# Patient Record
Sex: Female | Born: 1945
Health system: Southern US, Community
[De-identification: ages and names within clinical notes are randomized; demographics above are authoritative.]

## PROBLEM LIST (undated history)

## (undated) DIAGNOSIS — N390 Urinary tract infection, site not specified: Secondary | ICD-10-CM

## (undated) DIAGNOSIS — K429 Umbilical hernia without obstruction or gangrene: Secondary | ICD-10-CM

## (undated) DIAGNOSIS — T4145XA Adverse effect of unspecified anesthetic, initial encounter: Secondary | ICD-10-CM

## (undated) DIAGNOSIS — T8859XA Other complications of anesthesia, initial encounter: Secondary | ICD-10-CM

## (undated) DIAGNOSIS — N3941 Urge incontinence: Secondary | ICD-10-CM

## (undated) DIAGNOSIS — R32 Unspecified urinary incontinence: Secondary | ICD-10-CM

## (undated) DIAGNOSIS — E039 Hypothyroidism, unspecified: Secondary | ICD-10-CM

## (undated) DIAGNOSIS — Z5181 Encounter for therapeutic drug level monitoring: Secondary | ICD-10-CM

## (undated) DIAGNOSIS — E7800 Pure hypercholesterolemia, unspecified: Secondary | ICD-10-CM

## (undated) DIAGNOSIS — N3281 Overactive bladder: Secondary | ICD-10-CM

## (undated) DIAGNOSIS — Z79890 Hormone replacement therapy (postmenopausal): Secondary | ICD-10-CM

## (undated) HISTORY — DX: Hormone replacement therapy: Z79.890

## (undated) HISTORY — DX: Unspecified urinary incontinence: R32

## (undated) HISTORY — PX: TOE SURGERY: SHX1073

## (undated) HISTORY — DX: Encounter for therapeutic drug level monitoring: Z51.81

## (undated) HISTORY — PX: KNEE SURGERY: SHX244

## (undated) HISTORY — DX: Urinary tract infection, site not specified: N39.0

## (undated) HISTORY — PX: ROTATOR CUFF REPAIR: SHX139

## (undated) HISTORY — PX: CYSTOCELE REPAIR: SHX163

## (undated) HISTORY — PX: ABDOMINAL HYSTERECTOMY: SHX81

## (undated) HISTORY — PX: REDUCTION MAMMAPLASTY: SUR839

## (undated) HISTORY — PX: TONSILLECTOMY: SUR1361

## (undated) HISTORY — PX: NASAL SINUS SURGERY: SHX719

## (undated) HISTORY — DX: Urge incontinence: N39.41

## (undated) HISTORY — DX: Overactive bladder: N32.81

## (undated) HISTORY — DX: Umbilical hernia without obstruction or gangrene: K42.9

## (undated) HISTORY — PX: OTHER SURGICAL HISTORY: SHX169

## (undated) HISTORY — PX: BREAST REDUCTION SURGERY: SHX8

## (undated) HISTORY — PX: BREAST SURGERY: SHX581

---

## 2000-08-16 ENCOUNTER — Encounter: Payer: Self-pay | Admitting: Internal Medicine

## 2000-08-16 ENCOUNTER — Ambulatory Visit (HOSPITAL_COMMUNITY): Admission: RE | Admit: 2000-08-16 | Discharge: 2000-08-16 | Payer: Self-pay | Admitting: Internal Medicine

## 2000-12-04 ENCOUNTER — Encounter: Payer: Self-pay | Admitting: Obstetrics and Gynecology

## 2000-12-04 ENCOUNTER — Ambulatory Visit (HOSPITAL_COMMUNITY): Admission: RE | Admit: 2000-12-04 | Discharge: 2000-12-04 | Payer: Self-pay | Admitting: Obstetrics and Gynecology

## 2001-04-05 ENCOUNTER — Encounter: Payer: Self-pay | Admitting: Obstetrics and Gynecology

## 2001-04-05 ENCOUNTER — Ambulatory Visit (HOSPITAL_COMMUNITY): Admission: RE | Admit: 2001-04-05 | Discharge: 2001-04-05 | Payer: Self-pay | Admitting: Obstetrics and Gynecology

## 2001-10-21 ENCOUNTER — Other Ambulatory Visit: Admission: RE | Admit: 2001-10-21 | Discharge: 2001-10-21 | Payer: Self-pay | Admitting: Dermatology

## 2001-11-21 ENCOUNTER — Encounter: Payer: Self-pay | Admitting: Plastic Surgery

## 2001-11-21 ENCOUNTER — Ambulatory Visit (HOSPITAL_COMMUNITY): Admission: RE | Admit: 2001-11-21 | Discharge: 2001-11-21 | Payer: Self-pay | Admitting: Plastic Surgery

## 2001-11-22 ENCOUNTER — Encounter (INDEPENDENT_AMBULATORY_CARE_PROVIDER_SITE_OTHER): Payer: Self-pay | Admitting: *Deleted

## 2001-11-22 ENCOUNTER — Ambulatory Visit (HOSPITAL_BASED_OUTPATIENT_CLINIC_OR_DEPARTMENT_OTHER): Admission: RE | Admit: 2001-11-22 | Discharge: 2001-11-23 | Payer: Self-pay | Admitting: Plastic Surgery

## 2002-04-10 ENCOUNTER — Other Ambulatory Visit: Admission: RE | Admit: 2002-04-10 | Discharge: 2002-04-10 | Payer: Self-pay | Admitting: Unknown Physician Specialty

## 2002-05-27 ENCOUNTER — Inpatient Hospital Stay (HOSPITAL_COMMUNITY): Admission: RE | Admit: 2002-05-27 | Discharge: 2002-05-29 | Payer: Self-pay | Admitting: Obstetrics & Gynecology

## 2002-10-02 ENCOUNTER — Ambulatory Visit (HOSPITAL_COMMUNITY): Admission: RE | Admit: 2002-10-02 | Discharge: 2002-10-02 | Payer: Self-pay | Admitting: Family Medicine

## 2002-10-02 ENCOUNTER — Encounter: Payer: Self-pay | Admitting: Family Medicine

## 2002-11-27 ENCOUNTER — Encounter: Payer: Self-pay | Admitting: Obstetrics & Gynecology

## 2002-11-27 ENCOUNTER — Ambulatory Visit (HOSPITAL_COMMUNITY): Admission: RE | Admit: 2002-11-27 | Discharge: 2002-11-27 | Payer: Self-pay | Admitting: Obstetrics & Gynecology

## 2002-12-09 ENCOUNTER — Ambulatory Visit (HOSPITAL_COMMUNITY): Admission: RE | Admit: 2002-12-09 | Discharge: 2002-12-09 | Payer: Self-pay | Admitting: Obstetrics & Gynecology

## 2002-12-09 ENCOUNTER — Encounter: Payer: Self-pay | Admitting: Obstetrics & Gynecology

## 2002-12-17 ENCOUNTER — Ambulatory Visit (HOSPITAL_COMMUNITY): Admission: RE | Admit: 2002-12-17 | Discharge: 2002-12-17 | Payer: Self-pay | Admitting: Obstetrics & Gynecology

## 2002-12-17 ENCOUNTER — Other Ambulatory Visit: Admission: RE | Admit: 2002-12-17 | Discharge: 2002-12-17 | Payer: Self-pay | Admitting: Radiology

## 2002-12-17 ENCOUNTER — Encounter: Payer: Self-pay | Admitting: Obstetrics & Gynecology

## 2003-01-21 ENCOUNTER — Ambulatory Visit (HOSPITAL_COMMUNITY): Admission: RE | Admit: 2003-01-21 | Discharge: 2003-01-21 | Payer: Self-pay | Admitting: Family Medicine

## 2003-01-21 ENCOUNTER — Emergency Department (HOSPITAL_COMMUNITY): Admission: EM | Admit: 2003-01-21 | Discharge: 2003-01-21 | Payer: Self-pay | Admitting: Emergency Medicine

## 2003-12-10 ENCOUNTER — Ambulatory Visit (HOSPITAL_COMMUNITY): Admission: RE | Admit: 2003-12-10 | Discharge: 2003-12-10 | Payer: Self-pay | Admitting: Obstetrics & Gynecology

## 2004-08-15 ENCOUNTER — Ambulatory Visit: Payer: Self-pay | Admitting: Orthopedic Surgery

## 2004-09-26 ENCOUNTER — Ambulatory Visit: Payer: Self-pay | Admitting: Orthopedic Surgery

## 2004-12-13 ENCOUNTER — Ambulatory Visit (HOSPITAL_COMMUNITY): Admission: RE | Admit: 2004-12-13 | Discharge: 2004-12-13 | Payer: Self-pay | Admitting: Obstetrics & Gynecology

## 2005-04-11 IMAGING — CT CT PELVIS W/ CM
1 of 2 series · 14 of 32 positions shown, 19 images · IV contrast (CONTRAST)
Comparison: none

CLINICAL DATA: Right lower quadrant and mid abdominal pain.
 CT ABDOMEN WITH CONTRAST
 Spiral scanning was performed after oral administration of dilute contrast and during intravenous administration of 100 cc Omnipaque 300.  
 Lung bases are clear.  The liver has a normal appearance without focal lesions or biliary ductal dilatation.  Spleen, pancreas, adrenal glands and kidneys are normal.  No bowel pathology seen in the abdominal portion of the scan.  No free fluid or air.  No retroperitoneal pathology.  
 IMPRESSION
 Normal CT scan of the abdomen.
 CT PELVIS WITH CONTRAST
 Spiral scanning is performed after oral and intravenous contrast administration.
 The patient has inflammatory change surrounding the sigmoid colon which is present towards the right side of the lower abdomen and pelvis.  There is no evidence of actual abscess or free air.  The findings are likely to be due to diverticulitis but other forms of colitis are not excluded.  No sign of appendicitis.  No free fluid.  No sign of adenopathy.
 1.  Inflammatory changes of the sigmoid colon, most likely to represent diverticulitis.  No sign of abscess or free air.  See above.

[Series 8741: — · axial · 0.71mm/px · z∈[+1312,+1742]mm · 14 of 96 slices shown, 19 images]
[im 5/96  soft-tissue]
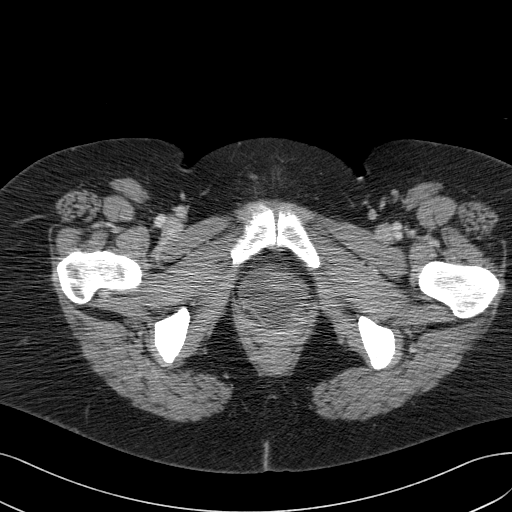
[im 5/96  bone]
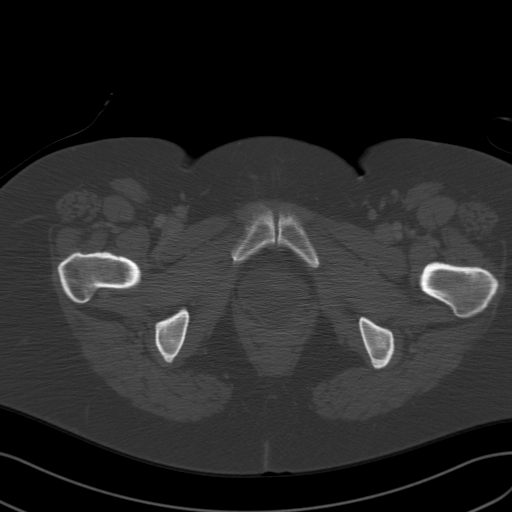
[im 14/96  soft-tissue]
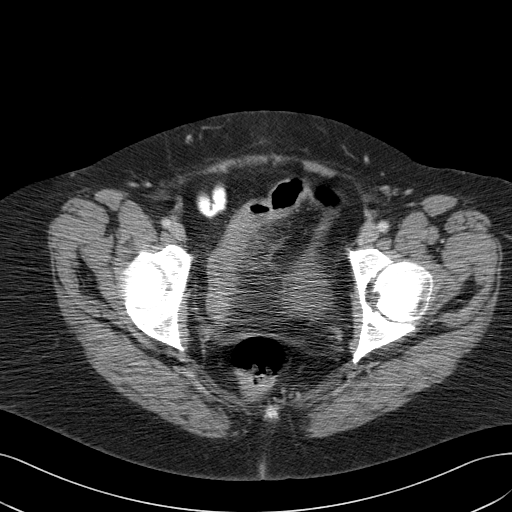
[im 19/96  soft-tissue]
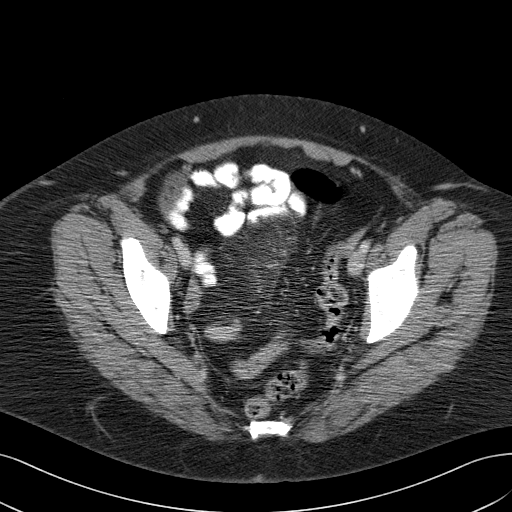
[im 28/96  soft-tissue]
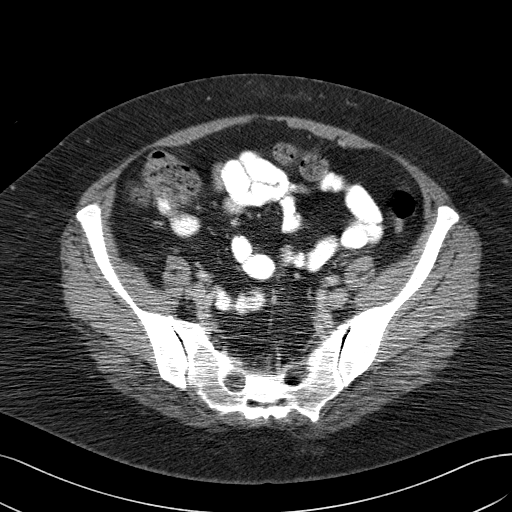
[im 32/96  soft-tissue]
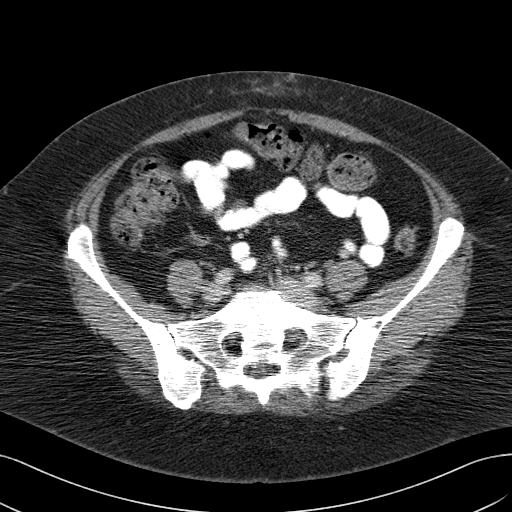
[im 41/96  soft-tissue]
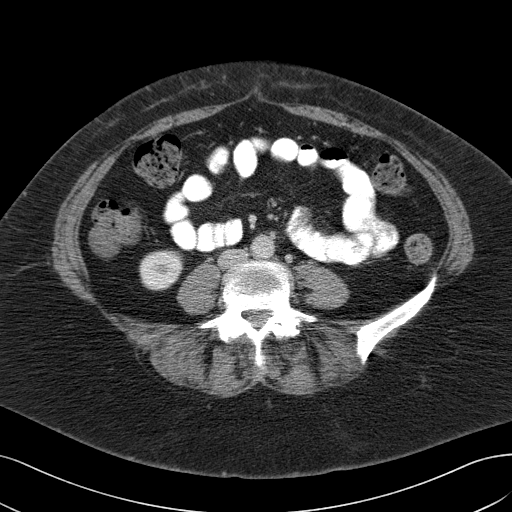
[im 50/96  soft-tissue]
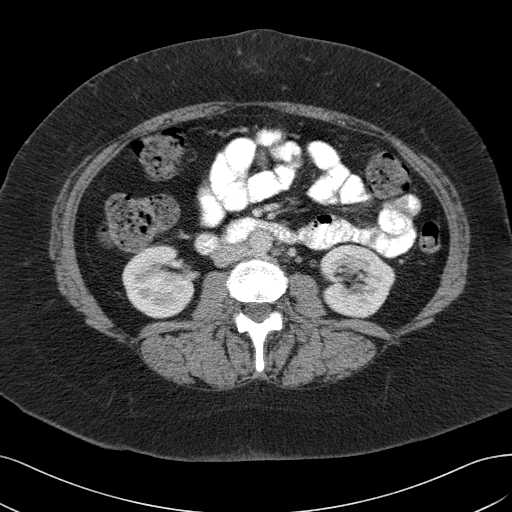
[im 55/96  soft-tissue]
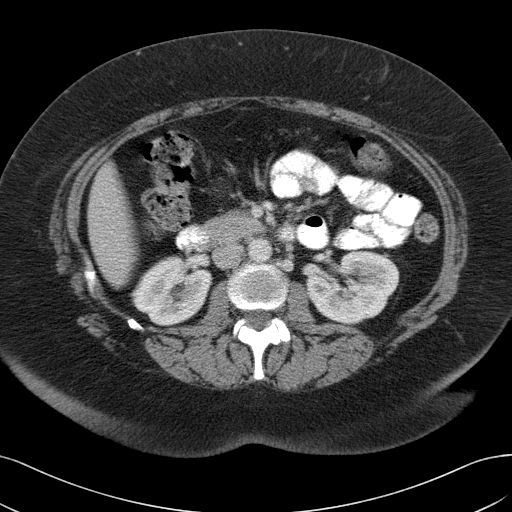
[im 64/96  soft-tissue]
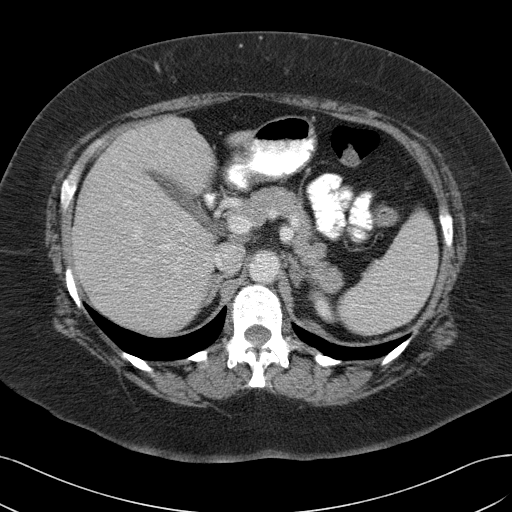
[im 64/96  bone]
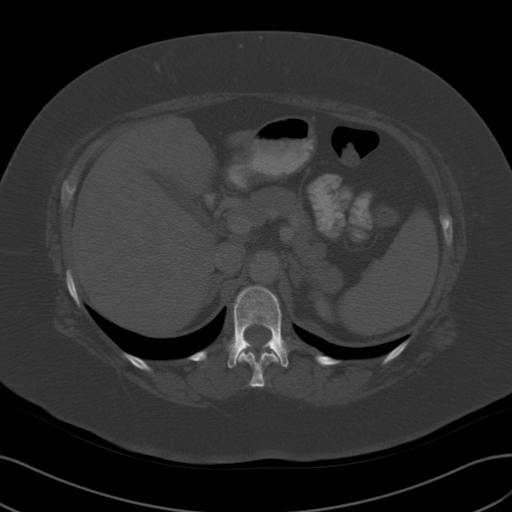
[im 68/96  soft-tissue]
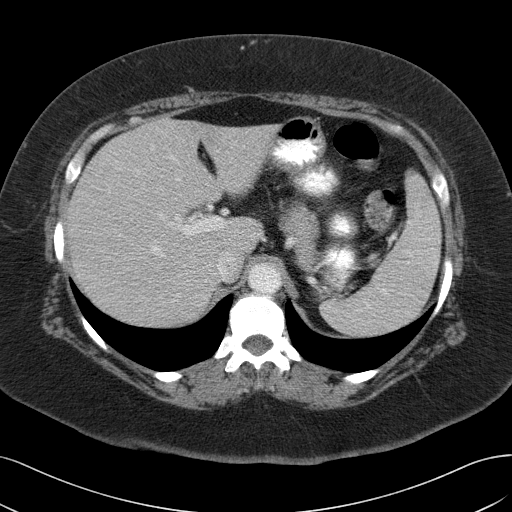
[im 77/96  soft-tissue]
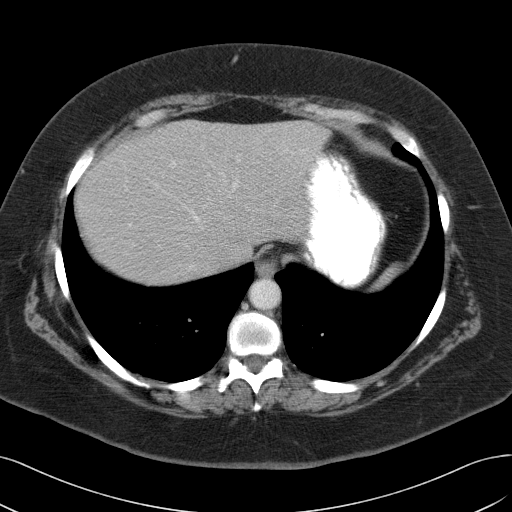
[im 77/96  lung]
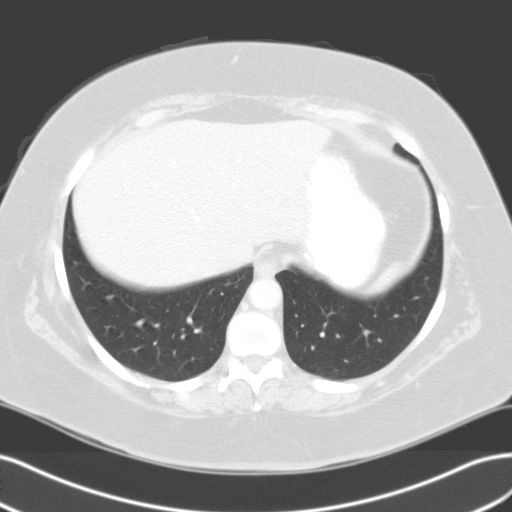
[im 82/96  soft-tissue]
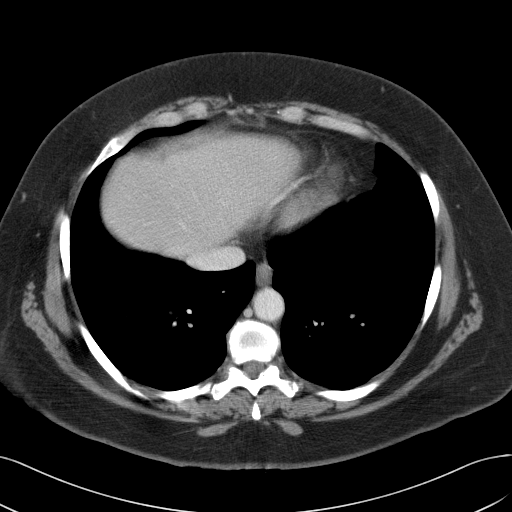
[im 82/96  lung]
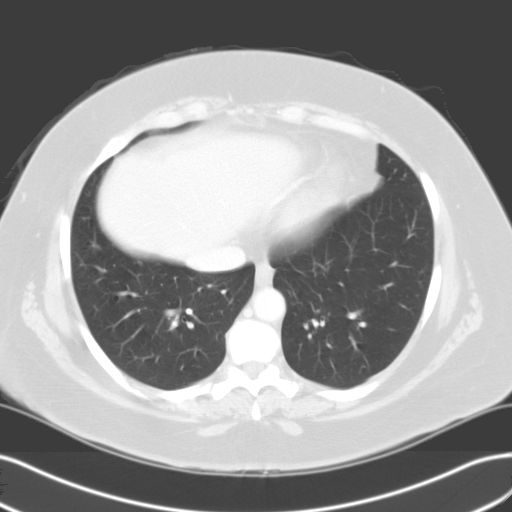
[im 86/96  lung]
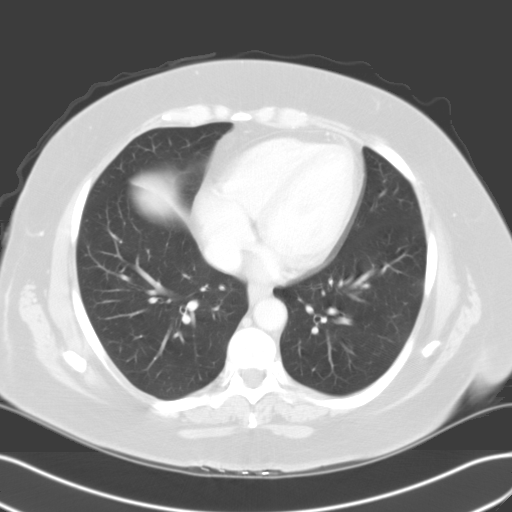
[im 91/96  soft-tissue]
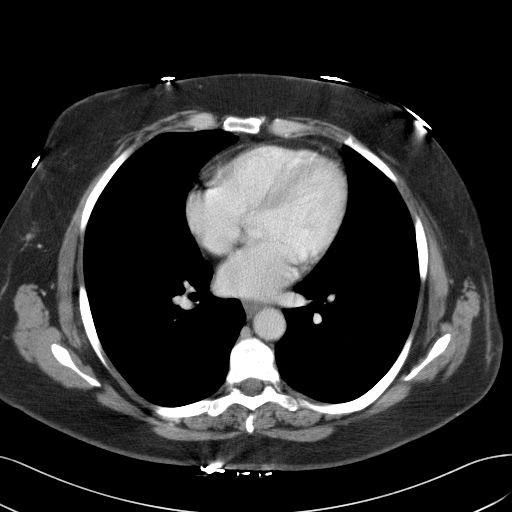
[im 91/96  lung]
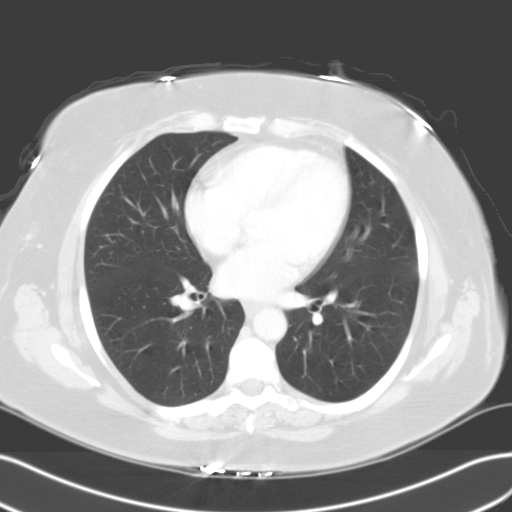

[14 of 32 positions shown; findings below may reference images not displayed]

## 2005-10-07 ENCOUNTER — Emergency Department (HOSPITAL_COMMUNITY): Admission: EM | Admit: 2005-10-07 | Discharge: 2005-10-07 | Payer: Self-pay | Admitting: Emergency Medicine

## 2005-10-18 ENCOUNTER — Ambulatory Visit (HOSPITAL_COMMUNITY): Admission: RE | Admit: 2005-10-18 | Discharge: 2005-10-18 | Payer: Self-pay | Admitting: Obstetrics & Gynecology

## 2005-12-19 ENCOUNTER — Ambulatory Visit (HOSPITAL_COMMUNITY): Admission: RE | Admit: 2005-12-19 | Discharge: 2005-12-19 | Payer: Self-pay | Admitting: Obstetrics & Gynecology

## 2006-12-24 ENCOUNTER — Ambulatory Visit (HOSPITAL_COMMUNITY): Admission: RE | Admit: 2006-12-24 | Discharge: 2006-12-24 | Payer: Self-pay | Admitting: Obstetrics & Gynecology

## 2007-07-30 ENCOUNTER — Ambulatory Visit (HOSPITAL_COMMUNITY): Admission: RE | Admit: 2007-07-30 | Discharge: 2007-07-30 | Payer: Self-pay | Admitting: Family Medicine

## 2007-09-30 ENCOUNTER — Ambulatory Visit: Payer: Self-pay | Admitting: Internal Medicine

## 2007-09-30 ENCOUNTER — Ambulatory Visit (HOSPITAL_COMMUNITY): Admission: RE | Admit: 2007-09-30 | Discharge: 2007-09-30 | Payer: Self-pay | Admitting: Internal Medicine

## 2007-10-03 ENCOUNTER — Ambulatory Visit (HOSPITAL_COMMUNITY): Admission: RE | Admit: 2007-10-03 | Discharge: 2007-10-03 | Payer: Self-pay | Admitting: Internal Medicine

## 2007-12-25 ENCOUNTER — Ambulatory Visit (HOSPITAL_COMMUNITY): Admission: RE | Admit: 2007-12-25 | Discharge: 2007-12-25 | Payer: Self-pay | Admitting: Obstetrics & Gynecology

## 2007-12-27 IMAGING — CR DG CHEST 1V PORT
1 series · 1 of 1 positions shown · non-contrast
Comparison: 10/02/02 digitized exam.

CLINICAL DATA: 59 year old with chest pain and choking episode.
 PORTABLE CHEST - 1 VIEW - 10/07/05:

[view not recorded]
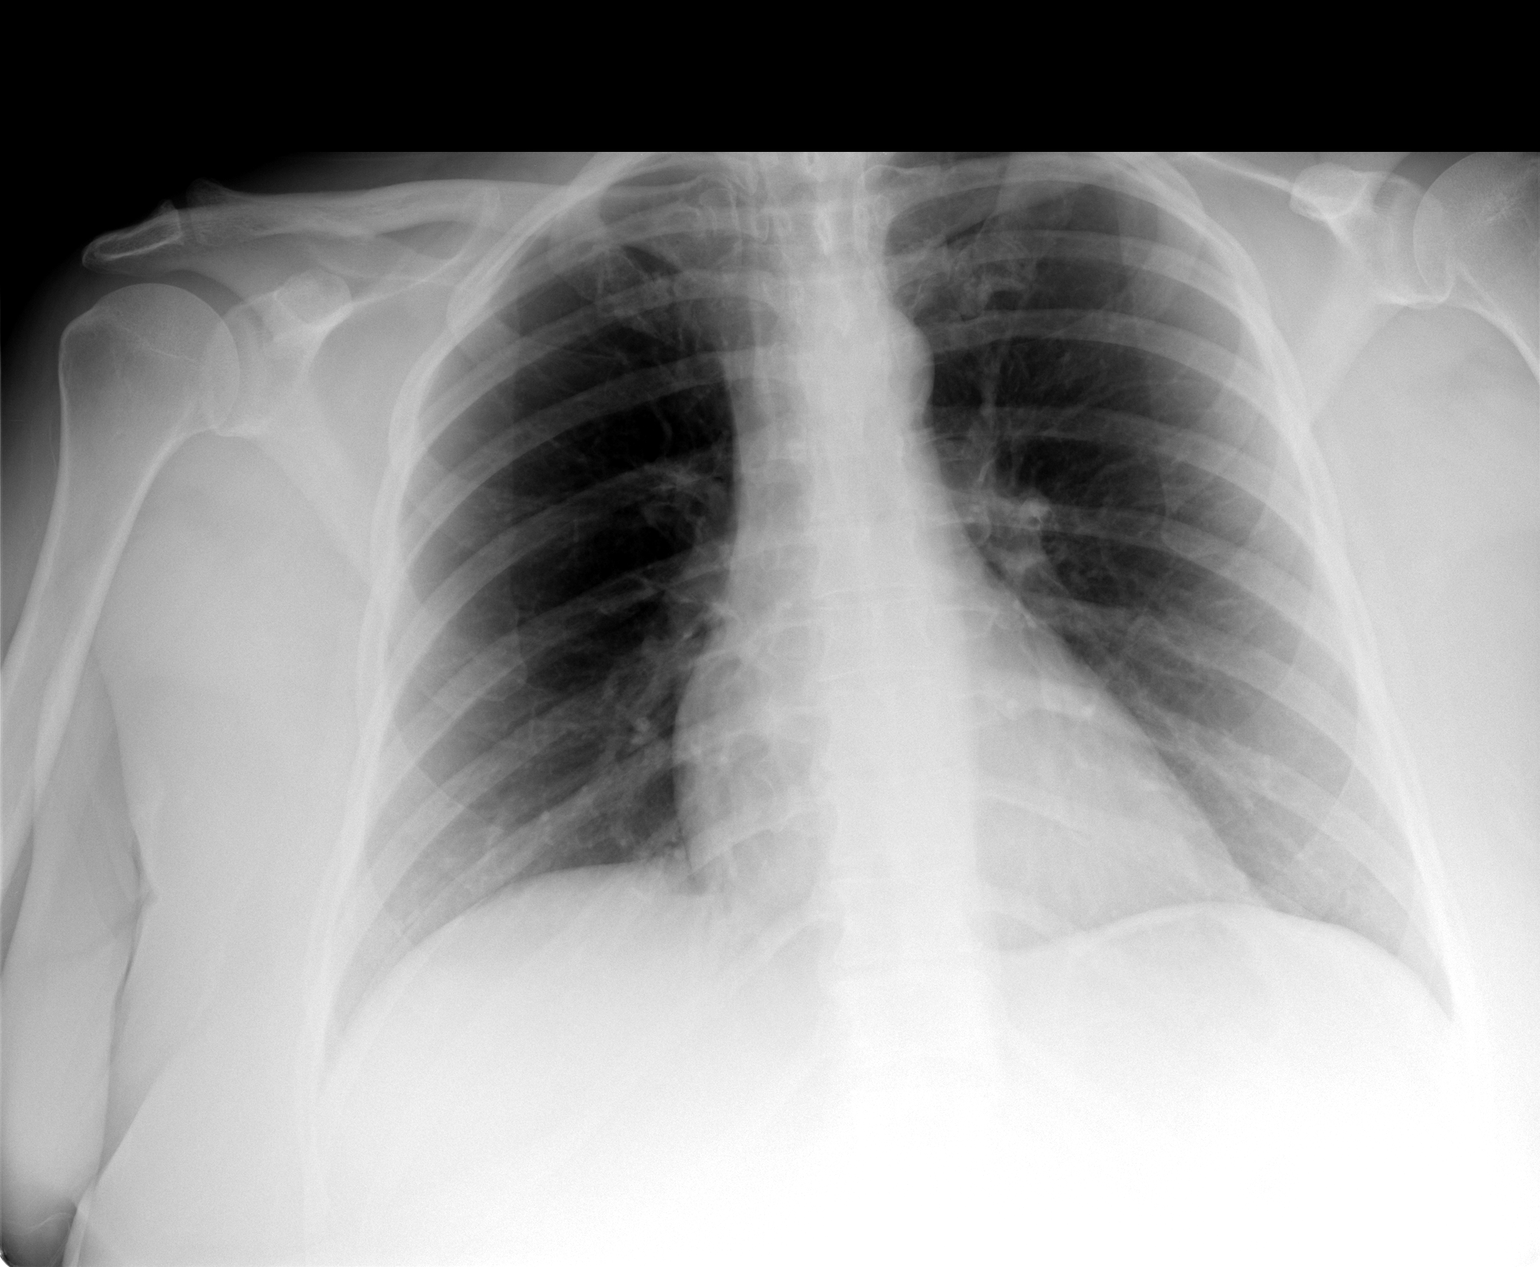

[1 of 1 positions shown; findings below may reference images not displayed]

FINDINGS: The heart size and mediastinal contours are within normal limits.  Both lungs are clear.
IMPRESSION: No acute disease.

## 2008-12-30 ENCOUNTER — Ambulatory Visit (HOSPITAL_COMMUNITY): Admission: RE | Admit: 2008-12-30 | Discharge: 2008-12-30 | Payer: Self-pay | Admitting: Obstetrics & Gynecology

## 2009-01-12 ENCOUNTER — Ambulatory Visit (HOSPITAL_COMMUNITY): Admission: RE | Admit: 2009-01-12 | Discharge: 2009-01-12 | Payer: Self-pay | Admitting: Family Medicine

## 2009-01-27 ENCOUNTER — Ambulatory Visit (HOSPITAL_COMMUNITY): Admission: RE | Admit: 2009-01-27 | Discharge: 2009-01-27 | Payer: Self-pay | Admitting: Family Medicine

## 2009-12-22 IMAGING — RF DG BE W/ AIR HIGH DENSITY
13 of 24 series · 13 of 24 positions shown · non-contrast
Comparison: None

CLINICAL DATA: Incomplete colonoscopy

AIR CONTRAST BARIUM ENEMA:
TECHNIQUE: After insertion of an enema tip, a double contrast barium enema was
performed.  High density barium was instilled retrograde into the
colon by gravity.  Air was insufflated into the colon manually.
Multiple images were obtained.
Total fluoroscopy time: 5 minutes

[Series 1: run · 1 of 1 slices shown (1 of 13)]
[im 1/1]
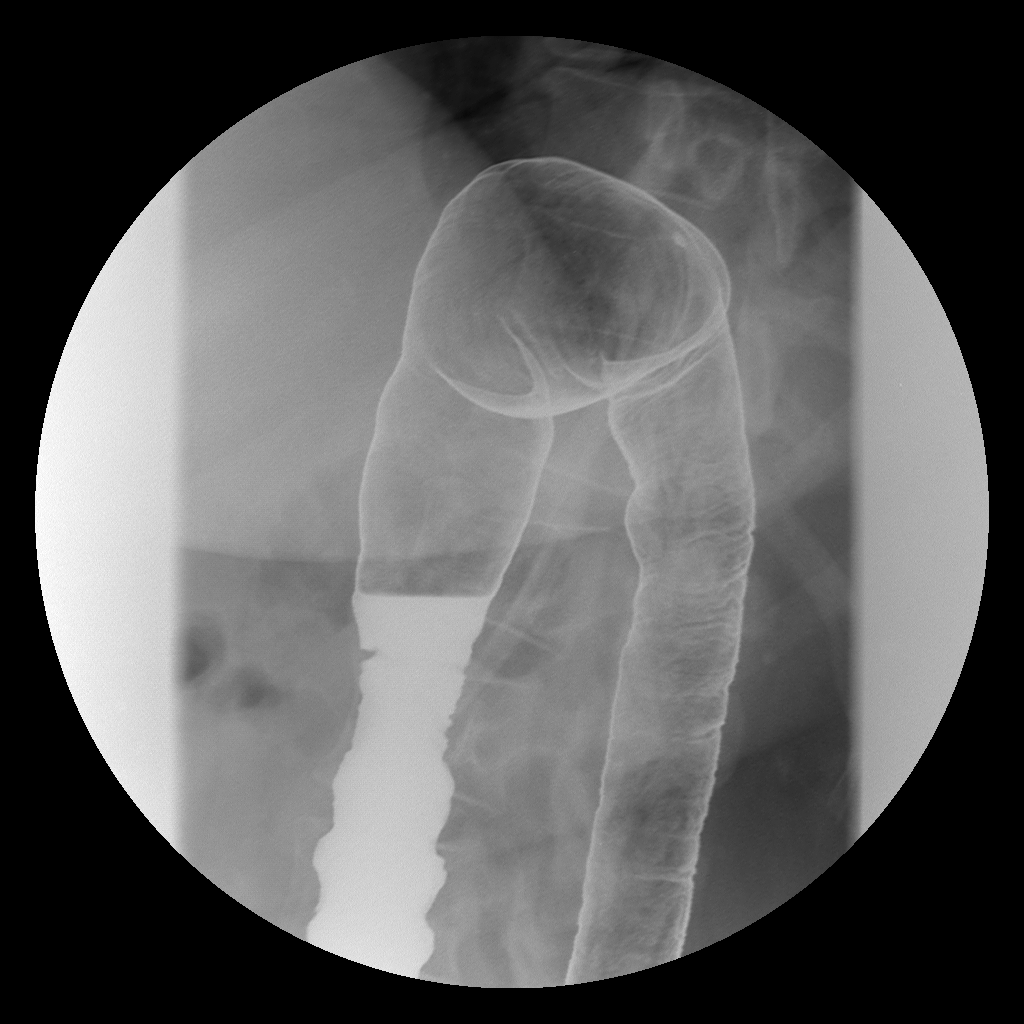

[Series 3: run · 1 of 1 slices shown (2 of 13)]
[im 1/1]
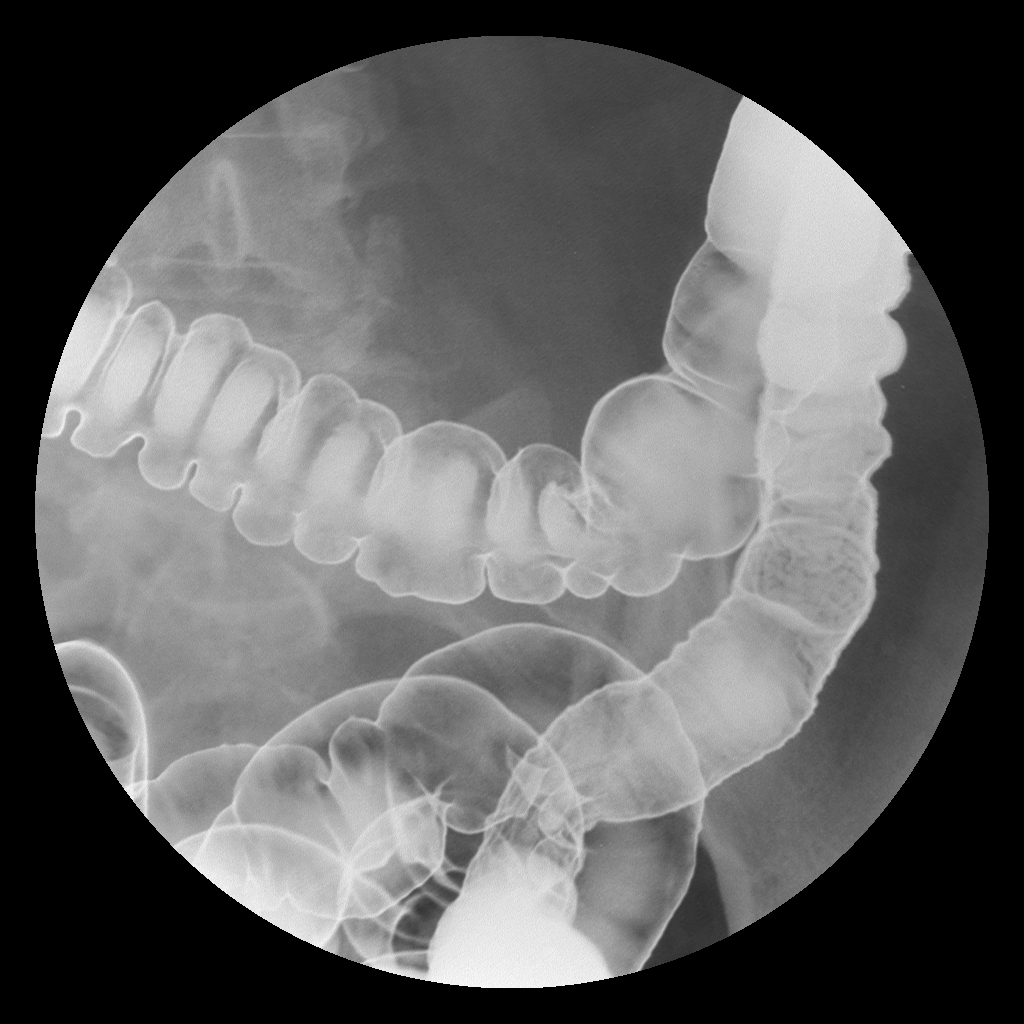

[Series 5: run · 1 of 1 slices shown (3 of 13)]
[im 1/1]
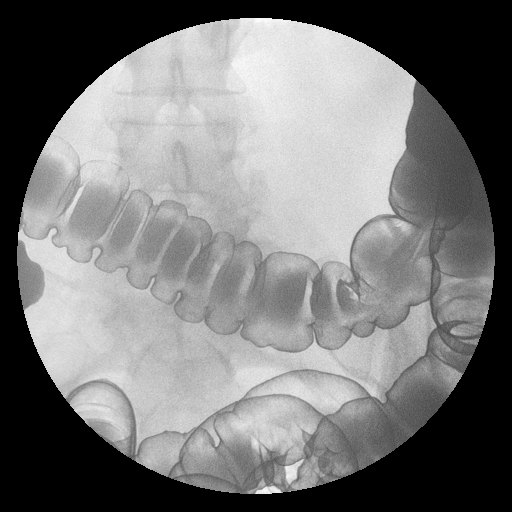

[Series 7: run · 1 of 1 slices shown (4 of 13)]
[im 1/1]
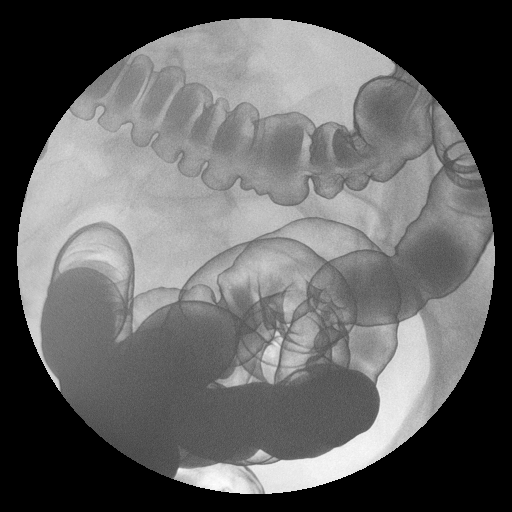

[Series 9: run · 1 of 1 slices shown (5 of 13)]
[im 1/1]
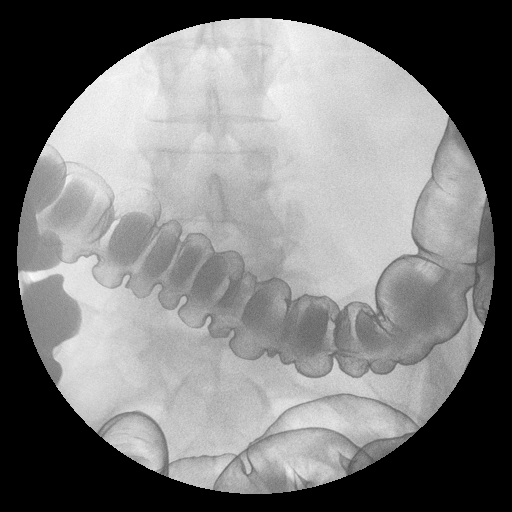

[Series 11: run · 1 of 1 slices shown (6 of 13)]
[im 1/1]
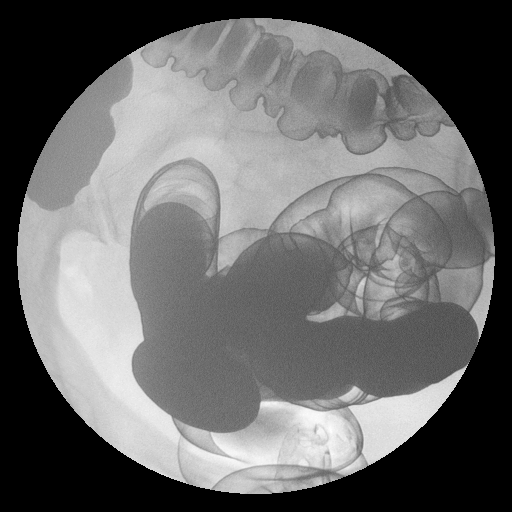

[Series 13: run · 1 of 1 slices shown (7 of 13)]
[im 1/1]
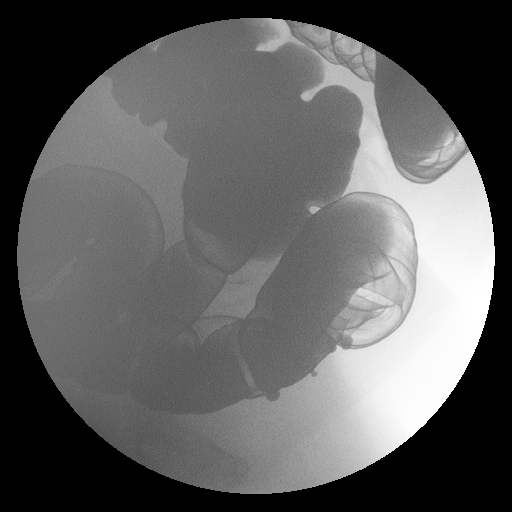

[Series 14: run · 1 of 1 slices shown (8 of 13)]
[im 1/1]
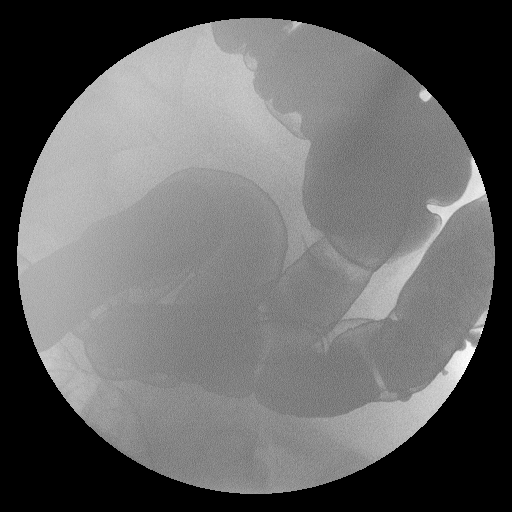

[Series 16: run · 1 of 1 slices shown (9 of 13)]
[im 1/1]
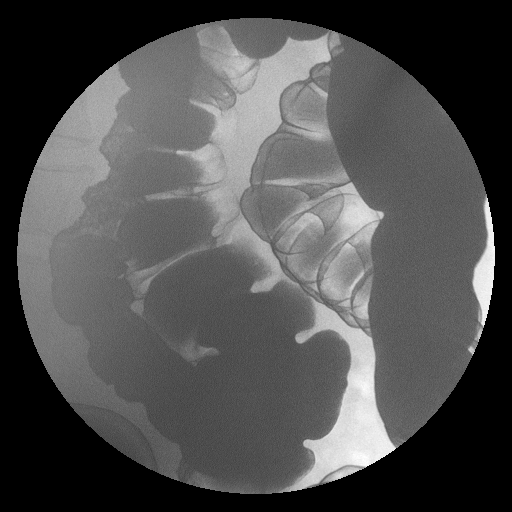

[Series 18: run · 1 of 1 slices shown (10 of 13)]
[im 1/1]
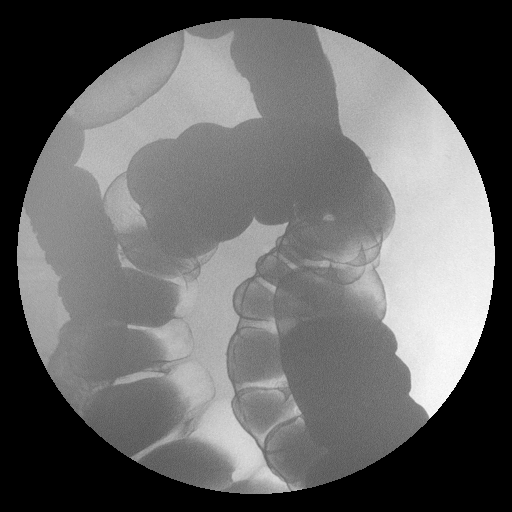

[Series 20: run · 1 of 1 slices shown (11 of 13)]
[im 1/1]
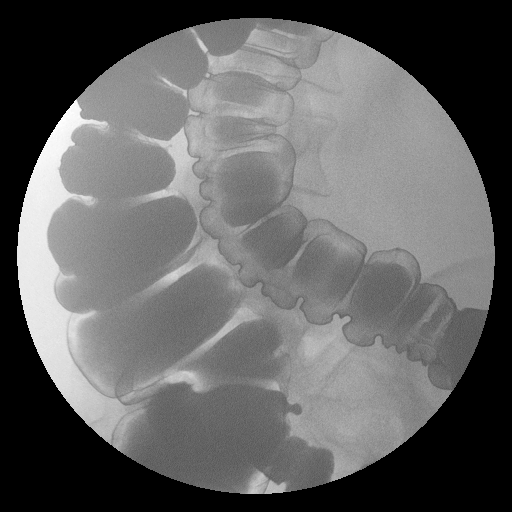

[Series 22: run · 1 of 1 slices shown (12 of 13)]
[im 1/1]
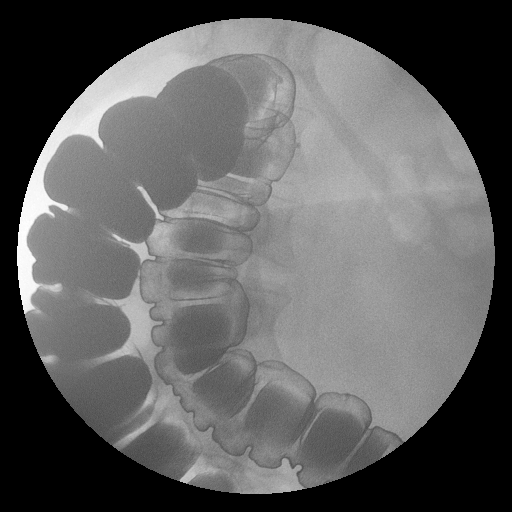

[Series 24: run · 1 of 1 slices shown (13 of 13)]
[im 1/1]
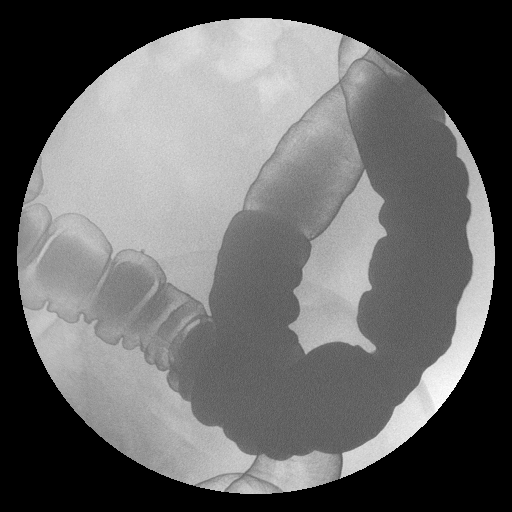

[13 of 24 positions shown; findings below may reference images not displayed]

FINDINGS: Colon distends normally throughout its length to cecum.
Incompetent ileocecal valve.
Appendix not identified.
Scattered sigmoid diverticula.
No colonic mass or stricture.
Mucosa smooth without irregularity or ulceration.
No persistent intraluminal filling defects.
Redundant sigmoid colon.
Long transverse colon descending into pelvis.
Bowel gas normal pattern on scout image.
IMPRESSION: Sigmoid diverticulosis at a redundant sigmoid colon.
Otherwise negative exam.

## 2010-01-24 ENCOUNTER — Ambulatory Visit (HOSPITAL_COMMUNITY)
Admission: RE | Admit: 2010-01-24 | Discharge: 2010-01-24 | Payer: Self-pay | Source: Home / Self Care | Admitting: Obstetrics & Gynecology

## 2010-01-28 ENCOUNTER — Ambulatory Visit (HOSPITAL_COMMUNITY): Admission: RE | Admit: 2010-01-28 | Payer: Self-pay | Admitting: Family Medicine

## 2010-03-19 ENCOUNTER — Encounter: Payer: Self-pay | Admitting: Obstetrics & Gynecology

## 2010-03-20 ENCOUNTER — Encounter: Payer: Self-pay | Admitting: Obstetrics & Gynecology

## 2010-03-20 ENCOUNTER — Encounter: Payer: Self-pay | Admitting: Family Medicine

## 2010-07-12 NOTE — Op Note (Signed)
Stacy Herrera, WEED            ACCOUNT NO.:  [id number]   MEDICAL RECORD NO.:  [id number]          PATIENT TYPE:  AMB   LOCATION:  DAY                           FACILITY:  [facility]   PHYSICIAN:  R. Roetta Sessions, M.D. DATE OF BIRTH:  03-15-1945   DATE OF PROCEDURE:  DATE OF DISCHARGE:                               OPERATIVE REPORT   INCOMPLETE COLONOSCOPY/SCREENING COLONOSCOPY.   INDICATIONS FOR PROCEDURE:  A 65 year old lady who comes for colorectal  cancer screening.  She has no lower GI tract symptoms.  I attempted a  screening colonoscopy back on April 29, 1999, and was unable to complete  the exam due to recurrent looping and the cecum was not reached.  A  colonoscopy is now being performed.  Risks, benefits, and alternatives  have been reviewed.  Please see documentation in medical record.   PROCEDURE NOTE:  O2 saturation, blood pressure, and pulse of the patient  monitored throughout the entire procedure.   CONSCIOUS SEDATION:  Versed and Demerol in incremental doses.   INSTRUMENT:  Pentax video chip system.   FINDINGS:  Digital rectal exam revealed no abnormalities.  Endoscopic  findings:  Prep was good.  Colon:  Colonic mucosa was surveyed well into  the rectosigmoid and only into the descending segment.  Unfortunately, I  again encountered recurrent looping and a degree of noncompliance of  left colon, which ultimately precluded advancement to the cecum in spite  of taking quite a while changing the patient's position, applying  external abdominal pressure to get around.  Ultimately, I was unable to  do so and further efforts were not felt to be fruitful; therefore, I  backed off and inspected the mucosal surfaces that were seen again.  I  pulled the scope down into the rectum where a thorough examination of  rectal mucosa including retroflexed view of anal verge demonstrated no  abnormalities.  The patient tolerated the procedure well and was  reactive to  endoscopy.   IMPRESSION:  1. Normal rectum.  2. Incomplete colonoscopy.  Sigmoid and at least a good part of the      descending colon was seen and mucosa was seen, appeared normal.      However, the upstream colon to the cecum has not been cleared.   RECOMMENDATIONS:  1. Air-contrast barium enema in the near future to complement today's      examination.  2. Further recommendations to follow.      Jonathon Bellows, M.D.  Electronically Signed     RMR/MEDQ  D:  09/30/2007  T:  09/30/2007  Job:  [id number]   cc:   Patrica Duel, M.D.  Fax: [phone]

## 2010-07-15 NOTE — Discharge Summary (Signed)
   NAME:  Stacy Herrera, Stacy Herrera                      ACCOUNT NO.:  [id number]   MEDICAL RECORD NO.:  [id number]                   PATIENT TYPE:  INP   LOCATION:  A420                                 FACILITY:  [facility]   PHYSICIAN:  Lazaro Arms, M.D.                DATE OF BIRTH:  February 06, 1946   DATE OF ADMISSION:  05/27/2002  DATE OF DISCHARGE:  05/29/2002                                 DISCHARGE SUMMARY   DISCHARGE DIAGNOSES:  1. Status post a retropubic Uretex mid-urethral sling.  2. Anterior colporrhaphy with the placement of a Pelvicol graft.  3. Vaginal vault suspension, interspinous.  4. Abdominoplasty.   PROCEDURE:  As above.   INDICATIONS FOR PROCEDURE:  Please refer to the transcribed history and  physical and the operative report for details of the admission to the  hospital.   HOSPITAL COURSE:  The patient was admitted postoperatively.  She had two JP  drains in and a Foley catheter in that will stay in place for a couple of  weeks.  She had a relatively unremarkable postoperative course.  Her  incision was clean, dry and intact when the bandage was removed on  postoperative day number two.  She remained afebrile with stable vital  signs.  She was tolerating clear liquids and a regular diet.  She was  ambulatory and tolerated oral pain medicine.   Her hemoglobin and hematocrit dropped maybe a little more than I thought  they might.  That was rechecked on postoperative day one and was 8.9 and  26.8.  On postoperative day two it was 8.6 and 25.6, with a white count of  7900.  I was comfortable with that level, and as a result discharged her to  home on the morning of postoperative day number two, in good and stable  condition.   FOLLOW UP:  She is to follow up in the office next week for an evaluation.  She was given Levaquin as an antibiotic, Tylox and Toradol for pain, and she  was given instructions to call us for return prior to that time.                         Lazaro Arms, M.D.    Loraine Maple  D:  06/10/2002  T:  06/10/2002  Job:  [id number]

## 2010-07-15 NOTE — H&P (Signed)
   NAME:  Stacy Herrera, Stacy Herrera                      ACCOUNT NO.:  [id number]   MEDICAL RECORD NO.:  [id number]                   PATIENT TYPE:  AMB   LOCATION:  DAY                                  FACILITY:  [facility]   PHYSICIAN:  Lazaro Arms, M.D.                DATE OF BIRTH:  05/19/1945   DATE OF ADMISSION:  DATE OF DISCHARGE:                                HISTORY & PHYSICAL   HISTORY OF PRESENT ILLNESS:  The patient is a 65 year old white female,  gravida 2, para 2, with genuine stress urinary incontinence.  Urodynamic  testing was performed in the office and reveals that the patient actually  has intrinsic sphincter deficiency as the cause of her genuine stress  urinary incontinence.  She wets herself multiple times a day and has to wear  a pad all of the time.  Additionally, she has a lot of pressure in the  vagina.  She has a history of an anterior and posterior repair in 1996.  She  has a grade 3 cystocele.  As a result, she is admitted for an anterior  colporrhaphy with Pelvicol graft and a Uridex retropubic sling of the mid  urethra.  Additionally, she desires an abdominoplasty for cosmetic results  and she is admitted for that as well.   PAST MEDICAL HISTORY:  Negative.   PAST SURGICAL HISTORY:  1. Tonsillectomy.  2. Shoulder surgery.  3. Hysterectomy.  4. Anterior and posterior colporrhaphy.  5. Breast reduction surgery.   PAST OBSTETRICAL HISTORY:  Two vaginal deliveries.   ALLERGIES:  PENICILLIN.   MEDICATIONS:  None.   REVIEW OF SYSTEMS:  Otherwise negative.   PHYSICAL EXAMINATION:  HEENT:  Unremarkable.  NECK:  The thyroid was normal.  LUNGS:  Clear.  HEART:  Regular rate and rhythm without murmur, rub, or gallop.  BREASTS:  Significant for a previous breast reduction.  ABDOMEN:  Benign.  PELVIC:  Grade 3 cystocele.  Again, urodynamic testing in the office  revealed intrinsic sphincter deficiency with a urethral pressure profile  maximum pressure of 25  with the lower limit of normal being 45.  The rest of  the pelvic exam is normal.  EXTREMITIES:  Warm with no edema.  NEUROLOGIC:  Grossly intact.    IMPRESSION:  1. Urinary incontinence, intrinsic sphincter deficiency.  2. Grade 3 cystocele.  3. Desires abdominoplasty.   PLAN:  The patient is admitted for the above-stated procedures.  She  understands the risks, benefits, indications, and alternatives and we will  proceed.                                               Lazaro Arms, M.D.    Loraine Maple  D:  05/26/2002  T:  05/26/2002  Job:  [id number]

## 2010-07-15 NOTE — Op Note (Signed)
NAME:  Stacy Herrera, Stacy Herrera                      ACCOUNT NO.:  [id number]   MEDICAL RECORD NO.:  [id number]                   PATIENT TYPE:  AMB   LOCATION:  DSC                                  FACILITY:  [facility]   PHYSICIAN:  Mary A. Contogiannis, M.D.          DATE OF BIRTH:  [birth year]   DATE OF PROCEDURE:  11/22/2001  DATE OF DISCHARGE:  11/22/2001                                 OPERATIVE REPORT   PREOPERATIVE DIAGNOSES:  1. Bilateral macromastia.  2. Skin lesion, right abdomen.   POSTOPERATIVE DIAGNOSES:  1. Bilateral macromastia.  2. Skin lesion, right abdomen.   PROCEDURES:  1. Bilateral reduction mammoplasties with liposuction of lateral breast     tissue.  2. Excision of skin lesion, right abdomen.   ATTENDING SURGEON:  Mary A. Contogiannis, M.D.   ASSISTANT:  Harl Bowie, M.D.   ANESTHESIA:  General endotracheal.   ANESTHESIOLOGIST:  Edwin Cap. Zoila Shutter, M.D.   ESTIMATED BLOOD LOSS:  350 cc.   FLUID REPLACEMENT:  3600 cc crystalloid.   URINE OUTPUT:  400 cc.   COMPLICATIONS:  None.   INDICATIONS FOR THE PROCEDURE:  The patient is a 65 year old Caucasian  female who has bilateral macromastia that is clinically symptomatic.  Her  primary care physician in particular also feels that she may be having  costochondritis associated with the large breasts.  The patient additionally  does have breast tissue that appears to have mixed in laterally on the chest  wall as well as in the axillary areas.  She presents to undergo bilateral  reduction mammoplasties with liposuction of the lateral breast areas.  She  also has a skin lesion on the right abdomen which has been getting larger  and becoming irritated.  She request that we proceed with excision of that  skin lesion at this time as well.   PROCEDURE:  The patient was marked in the preoperative holding area in the  pattern of Wise for the future bilateral reduction mammoplasties.  The areas  for liposuction  were also outlined.  She was taken back to the OR and placed  on the table in the supine position.  After adequate general endotracheal  anesthesia was obtained, the patient's chest and lateral breast areas,  including the posterior axilla, were prepped with Betadine and alcohol and  draped in a sterile fashion.  The bases of both breasts were then injected  with 1% lidocaine with epinephrine.  After adequate hemostasis and  anesthesia had taken effect, the procedure was begun.   First the right breast reduction was performed.  The nipple was marked with  the 42-mm nipple marker.  This was then incised and the skin de-  epithelialized around it in the inframammary crease in an inferior pedicle  pattern.  Next the medial, superior and lateral skin flaps were elevated  down to the chest wall.  Prior to elevating the skin flaps, the area along  the lateral  aspect of the breast had been injected with tumescent fluid as  indicated on the nurses notes.  The excess fat and glandular tissue were  removed from the inferior pedicle.  The nipple was then examined and found  to be pink and viable.  Liposuction of the lateral breast tissue was then  performed.  A total of 400 cc of tissue was liposuctioned.  The wound was  then irrigated with saline irrigation.  Meticulous hemostasis was obtained  with the Bovie electrocautery.  The inferior pedicle was centralized using 3-  0 Prolene suture.  A #10 JP flat fully fluted drain was then placed into the  wound.  The skin flaps were brought together at the inverted T junction with  a 2-0 Prolene suture.  The incisions were then stapled for temporary  closure.   Attention was then turned to the left breast.  The nipple was marked with a  42-mm nipple marker.  The skin was then incised around this and the skin de-  epithelialized at the inframammary crease in an inferior pedicle pattern.  Next the lateral breast tissue was infused with tumescent fluid as  noted on  the nurses notes.  The medial, superior and lateral skin flaps were then  elevated down through the chest wall.  The excess fat and glandular tissue  were removed from the inferior pedicle.  The nipple was examined and found  to be pink and viable.  Liposuction was performed along the lateral breast  and axillary areas.  A total of 410 cc of fluid was aspirated.  The wound  was then irrigated with saline irrigation.  Meticulous hemostasis was  obtained with the Bovie electrocautery.  The inferior pedicle was  centralized using 3-0 Prolene suture.  A #10 JP flat fully fluted drain was  then placed into the wound.  The superior flap was brought together at the  inverted T junction with a 2-0 Prolene suture.   The breasts were then compared and found to have good shape and symmetry.  All of the staples were then removed and the incisions closed using 3-0  Monocryl interrupted dermal sutures followed by 4-0 Monocryl intracuticular  stitch on the skin.  The patient was then placed in the upright position.  The future location of the nipple-areolar complexes were marked on both  breasts.  She was then placed back in the recumbent position.  The 38-mm  nipple marker was used.  The area for the right nipple-areolar complex was  then incised on the breast mound and this tissue excised full-thickness.  The nipple was examined and found to be pink and viable.  It was then  brought out into this aperture and sewn in place using 4-0 Monocryl  interrupted dermal sutures, followed by 5-0 running intracuticular stitch on  the skin.   In a similar fashion, the future nipple-areolar complex was excised on the  left breast.  This tissue was excised full-thickness.  The nipple was then  examined and found to be pink and viable.  It was brought out into this  aperture and sewn in place using 4-0 Monocryl interrupted dermal sutures followed by 5-0 Monocryl running intracuticular stitch on the skin.   The JP  drain was sewn in place using 3-0 nylon suture.  The skin lesion on the  right abdomen was then excised and passed off the table to undergo permanent  pathologic section evaluation.  The skin edges were then closed with a 6-0  Prolene  suture.  The incisions were dressed with Benzoin and Steri-Strips  and the nipples additionally with Bacitracin ointment and Adaptic.  The  areas where liposuction had been performed were dressed with ABD pads and  the postoperative support bra should give gentle compression in these areas.  All the other incisions were additionally dressed with 4 x 4 gauze.  There  were no complications.  The patient tolerated the procedure.  She was then  extubated and taken to the recovery room in stable condition.  The patient  remained overnight in the RCC for progress pain control, ambulation, and  monitoring of the nipples and breast flaps.  Discharge is planned for the  morning.  Total amounts of breast tissue removed were a total of 924 g on  the right breast and a total of 1140 g on the left breast.  This included  the direct excision, as well as the liposuction of the breast tissue.                                               Mary A. Contogiannis, M.D.    MAC/MEDQ  D:  11/22/2001  T:  11/25/2001  Job:  [id number]   cc:   Mary A. Contogiannis, M.D.   Harl Bowie, M.D.  [address]  [place]  [place] [address]  Fax: [phone]

## 2010-07-15 NOTE — Op Note (Signed)
NAME:  NATISHA, TRZCINSKI                      ACCOUNT NO.:  [id number]   MEDICAL RECORD NO.:  [id number]                   PATIENT TYPE:  INP   LOCATION:  A420                                 FACILITY:  [facility]   PHYSICIAN:  Lazaro Arms, M.D.                DATE OF BIRTH:  29-Mar-1945   DATE OF PROCEDURE:  05/27/2002  DATE OF DISCHARGE:                                 OPERATIVE REPORT   PREOPERATIVE DIAGNOSES:  1. Genuine stress urinary incontinence.  2. Intrinsic sphincter deficiency.  3. Grade 3 cystocele.  4. Desires abdominoplasty.   POSTOPERATIVE DIAGNOSES:  1. Genuine stress urinary incontinence.  2. Intrinsic sphincter deficiency.  3. Grade 3 cystocele.  4. Desires abdominoplasty.   PROCEDURE:  1. Urotek retropubic sling.  2. Vaginal vault suspension.  3. Anterior colporrhaphy with placement of Pelvicol graft.  4. Abdominoplasty.   SURGEON:  Lazaro Arms, M.D.   ANESTHESIA:  General endotracheal.   FINDINGS:  The patient presented to the office and from urodynamic testing  in the office was found to have a grade 3 cystocele, and she also had  intrinsic sphincter deficiency causing her stress urinary incontinence.  The  cystocele was causing an obstructive pattern.  It was basically keeping her  continent, and she had an obstructive flow pattern.  In addition, she  requested that an abdominoplasty be performed.   DESCRIPTION OF OPERATION:  The patient was taken to the operating room and  was placed in the supine position where she underwent general endotracheal  anesthesia.  A Foley catheter was placed, and her abdomen was prepped and  draped in the usual sterile fashion.  Prior to prepping, the patient  underwent drawing, which we had talked about in the office as well and for  the abdominoplasty performed.  The incision was made above the umbilicus and  down below the umbilicus, and hemostasis was achieved using the  electrocautery unit.  It was taken down  to the Scarpa's fascia, and the  umbilicus was dissected out.  The top edge of the incision was then brought  down to the lower edge in layers.  A total of four layers was placed using 3-  0 Vicryl suture.  We made an elliptical incision above the upper incision  and pulled the umbilicus through and again tacked this down with 3-0 Vicryl.  After the four layers of sutures were placed and I had already placed  drains, staples were placed for skin closure.  I also placed interrupted PDS  retention sutures in a vertical mattress fashion to take some tension off of  the staples in certain areas.  The JP drains were patent, and the dressing  was placed.  For this part of the procedure, the estimated blood loss was  250 mL, and it was approximately 1-1/2 hours.  We then undraped the patient  and placed her in a high lithotomy position.  The lower abdomen, perineum,  vagina, perianal area, and the inner thighs were prepped with Betadine, and  another Foley catheter was placed sterilely after the drapes had been  placed.  The patient had a previous hysterectomy, and she again had the  grade 3 cystocele noted.  The vaginal cuff scar was identified and excised  at that point dissecting the bladder off of the vagina using both the knife  and Metzenbaum and blunt pressure.  0.5% Marcaine was injected to define the  plane; I dissected it back all the way into the urethra.  The pubocervical  fascia was identified.  I also dissected up to the bottom edge of the pubis  in preparation for the sling and then punctured the urogenital diaphragm and  identified the ischial spines bilaterally.  I then used the Pelvicol graft  and had already cut it to size and attached the Pelvicol grafts to the  fascia of the ischial spines bilaterally.  I then let the top edge be free  and made two stab incisions just above the pubis, and the Urotek sling was  placed in the usual fashion.  I did a diagnostic cystoscopy after  placing  each side, and there was no puncture of the bladder.  I filled the bladder  up with normal saline, and I was wiggling the metal needle on each side, but  there was no puncture on either side.  It was just a little more medial than  I wanted to be on the right, and I replaced the needle again, not puncturing  the bladder.  The Urotek was then pulled back through.  A Kelly clamp was  placed between the Urotek and the urethra, so it was not too tight, and then  I released it up further so I could put the [place] clamp under and then take  it to a right angle of the floor and move it from side to side, and I could  also see the Urotek sling with the naked eye in the resting position.  I  then tacked the Pelvicol graft to the pubocervical fascia bilaterally on  either side of the urethra.  I then sutured the vagina as an intersinus  vaginal vault suspension to the vaginal cuff and then closed.  I did not  trim any vagina back, and then I sutured the vagina closed with interrupted  0 Vicryl sutures.  The patient experienced about 300 mL of blood loss  through this portion of the procedure.  The vagina was then packed, and  another Foley catheter was placed.  Staples were placed in a stab incision  above the pubis, and they were dressed.  The patient was then taken down out  of lithotomy, awakened from anesthesia, and taken to recovery in good,  stable condition.  All counts were correct x3.  She received Levaquin  prophylactically and again experienced 550 mL of blood loss for the entire  procedure.                                               Lazaro Arms, M.D.    Loraine Maple  D:  05/28/2002  T:  05/28/2002  Job:  [id number]

## 2011-01-03 ENCOUNTER — Other Ambulatory Visit: Payer: Self-pay | Admitting: Obstetrics & Gynecology

## 2011-01-03 DIAGNOSIS — Z139 Encounter for screening, unspecified: Secondary | ICD-10-CM

## 2011-01-27 ENCOUNTER — Ambulatory Visit (HOSPITAL_COMMUNITY)
Admission: RE | Admit: 2011-01-27 | Discharge: 2011-01-27 | Disposition: A | Discharge status: Home / Self Care | Payer: PRIVATE HEALTH INSURANCE | Source: Ambulatory Visit | Attending: Obstetrics & Gynecology | Admitting: Obstetrics & Gynecology

## 2011-01-27 DIAGNOSIS — Z139 Encounter for screening, unspecified: Secondary | ICD-10-CM

## 2011-01-27 DIAGNOSIS — Z1231 Encounter for screening mammogram for malignant neoplasm of breast: Secondary | ICD-10-CM | POA: Insufficient documentation

## 2011-03-21 IMAGING — MG MM DIGITAL SCREENING
5 series · 5 of 5 positions shown · non-contrast
Comparison: none

DG SCREEN MAMMOGRAM BILATERAL
Bilateral CC and MLO view(s) were taken.

DIGITAL SCREENING MAMMOGRAM WITH CAD:
The breast tissue is almost entirely fatty.  No masses or malignant type calcifications are 
identified.  Compared with prior studies.
Images were processed with CAD.

[L CC]
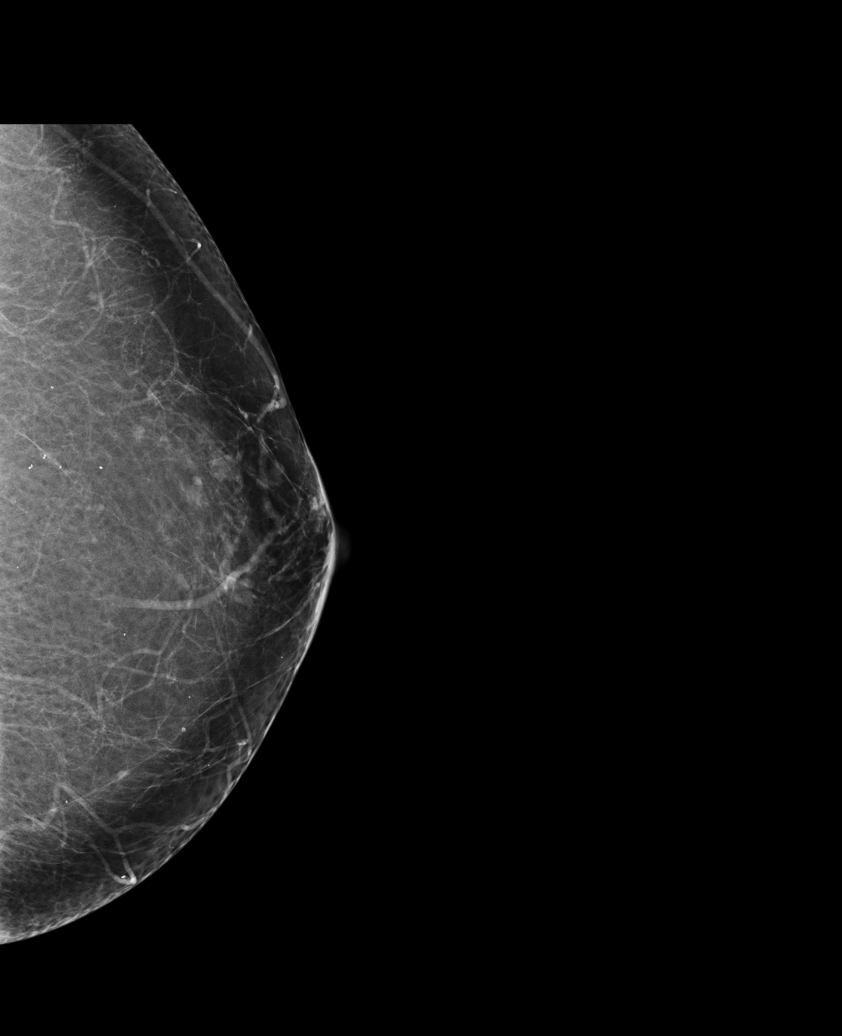

[L MLO]
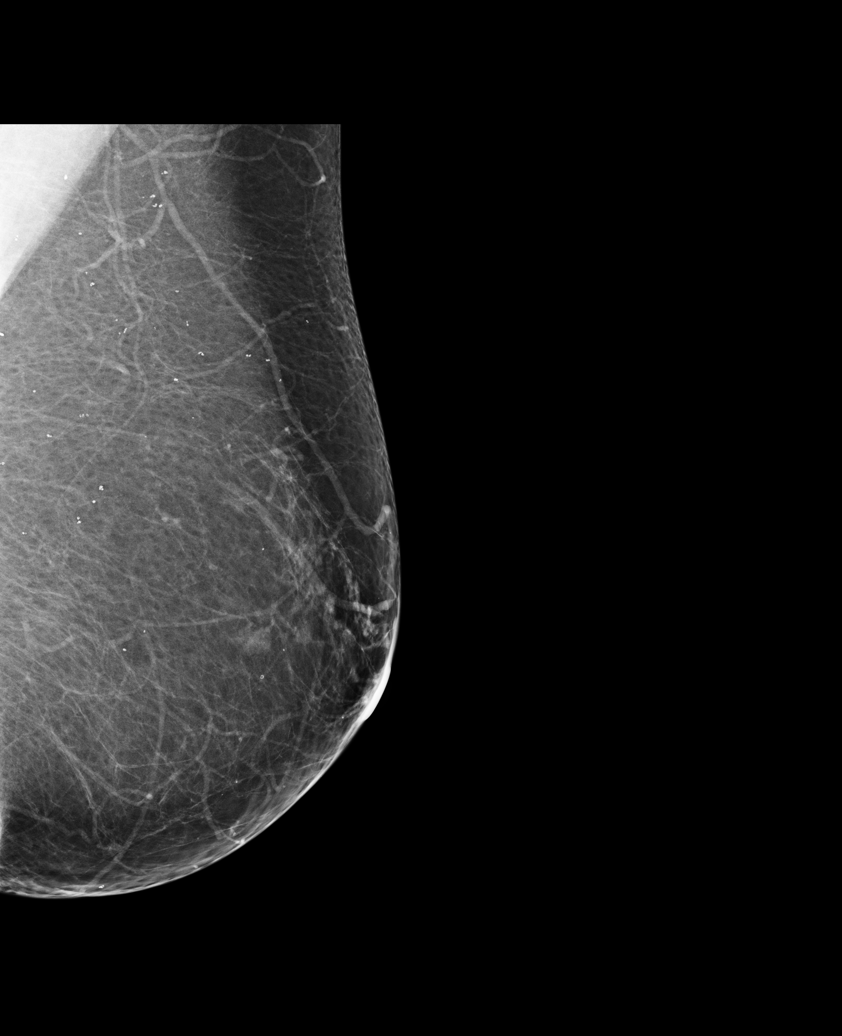

[R CC]
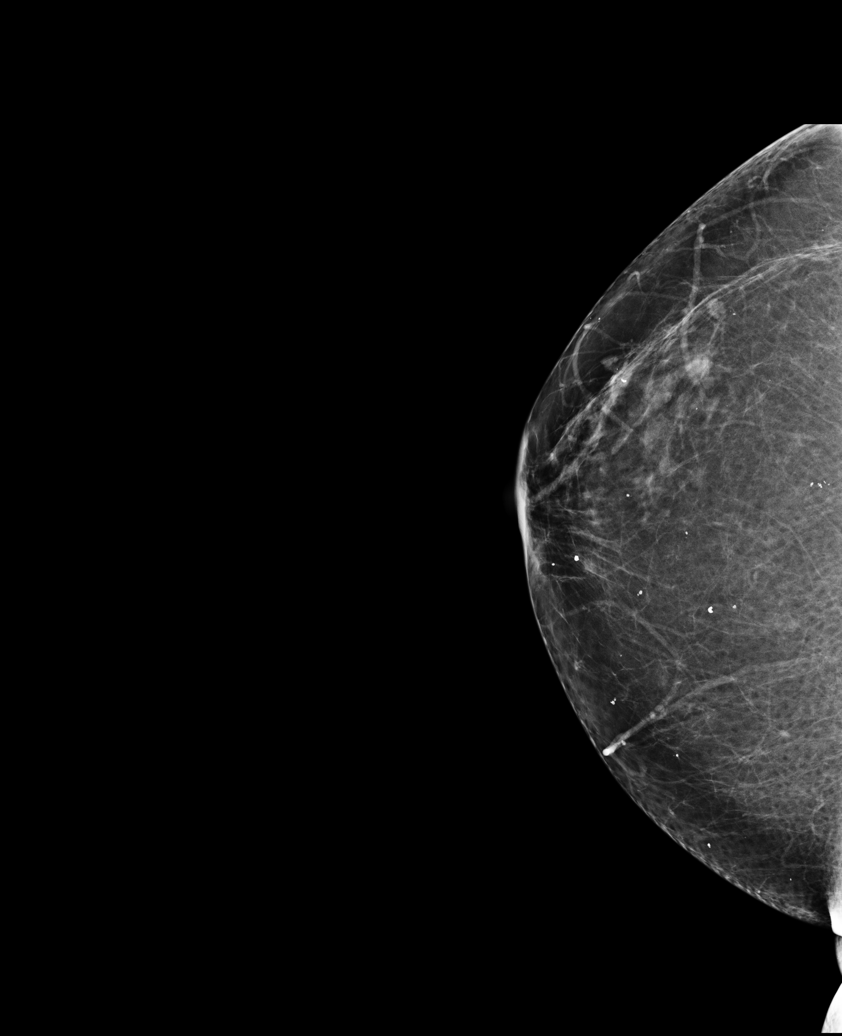

[R MLO (1 of 2)]
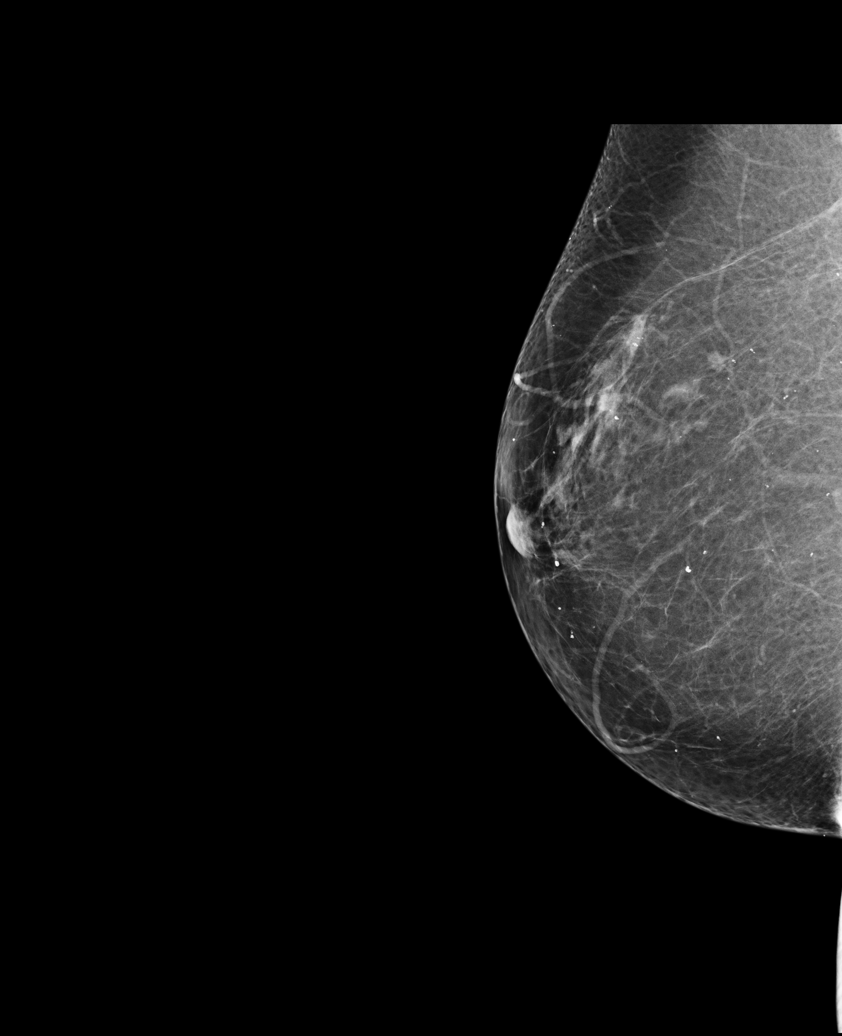

[R MLO (2 of 2)]
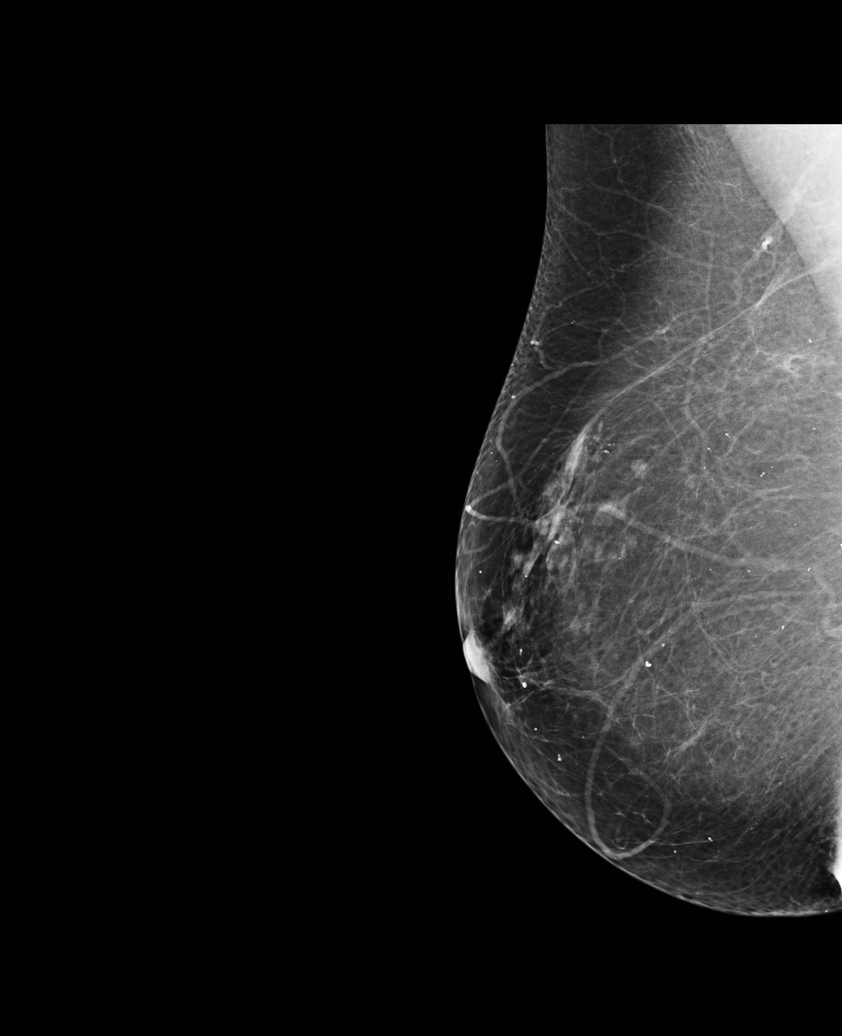

[5 of 5 positions shown; findings below may reference images not displayed]

IMPRESSION: No specific mammographic evidence of malignancy.  Next screening mammogram is recommended in one 
year.

A result letter of this screening mammogram will be mailed directly to the patient.

ASSESSMENT: Negative - BI-RADS 1

Screening mammogram in 1 year.
,

## 2011-04-03 ENCOUNTER — Emergency Department (HOSPITAL_COMMUNITY)
Admission: EM | Admit: 2011-04-03 | Discharge: 2011-04-03 | Disposition: A | Discharge status: Home / Self Care | Payer: PRIVATE HEALTH INSURANCE | Attending: Emergency Medicine | Admitting: Emergency Medicine

## 2011-04-03 ENCOUNTER — Encounter (HOSPITAL_COMMUNITY): Payer: Self-pay | Admitting: *Deleted

## 2011-04-03 DIAGNOSIS — J029 Acute pharyngitis, unspecified: Secondary | ICD-10-CM | POA: Insufficient documentation

## 2011-04-03 DIAGNOSIS — E7800 Pure hypercholesterolemia, unspecified: Secondary | ICD-10-CM | POA: Insufficient documentation

## 2011-04-03 DIAGNOSIS — J069 Acute upper respiratory infection, unspecified: Secondary | ICD-10-CM | POA: Insufficient documentation

## 2011-04-03 HISTORY — DX: Pure hypercholesterolemia, unspecified: E78.00

## 2011-04-03 LAB — RAPID STREP SCREEN (MED CTR MEBANE ONLY): Streptococcus, Group A Screen (Direct): NEGATIVE

## 2011-04-03 IMAGING — CR DG RIBS W/ CHEST 3+V*R*
3 series · 3 of 3 positions shown · non-contrast
Comparison: 07/30/2007

CLINICAL DATA: Right rib pain.

RIGHT RIBS AND CHEST - 3+ VIEW

[view not recorded (1 of 3)]
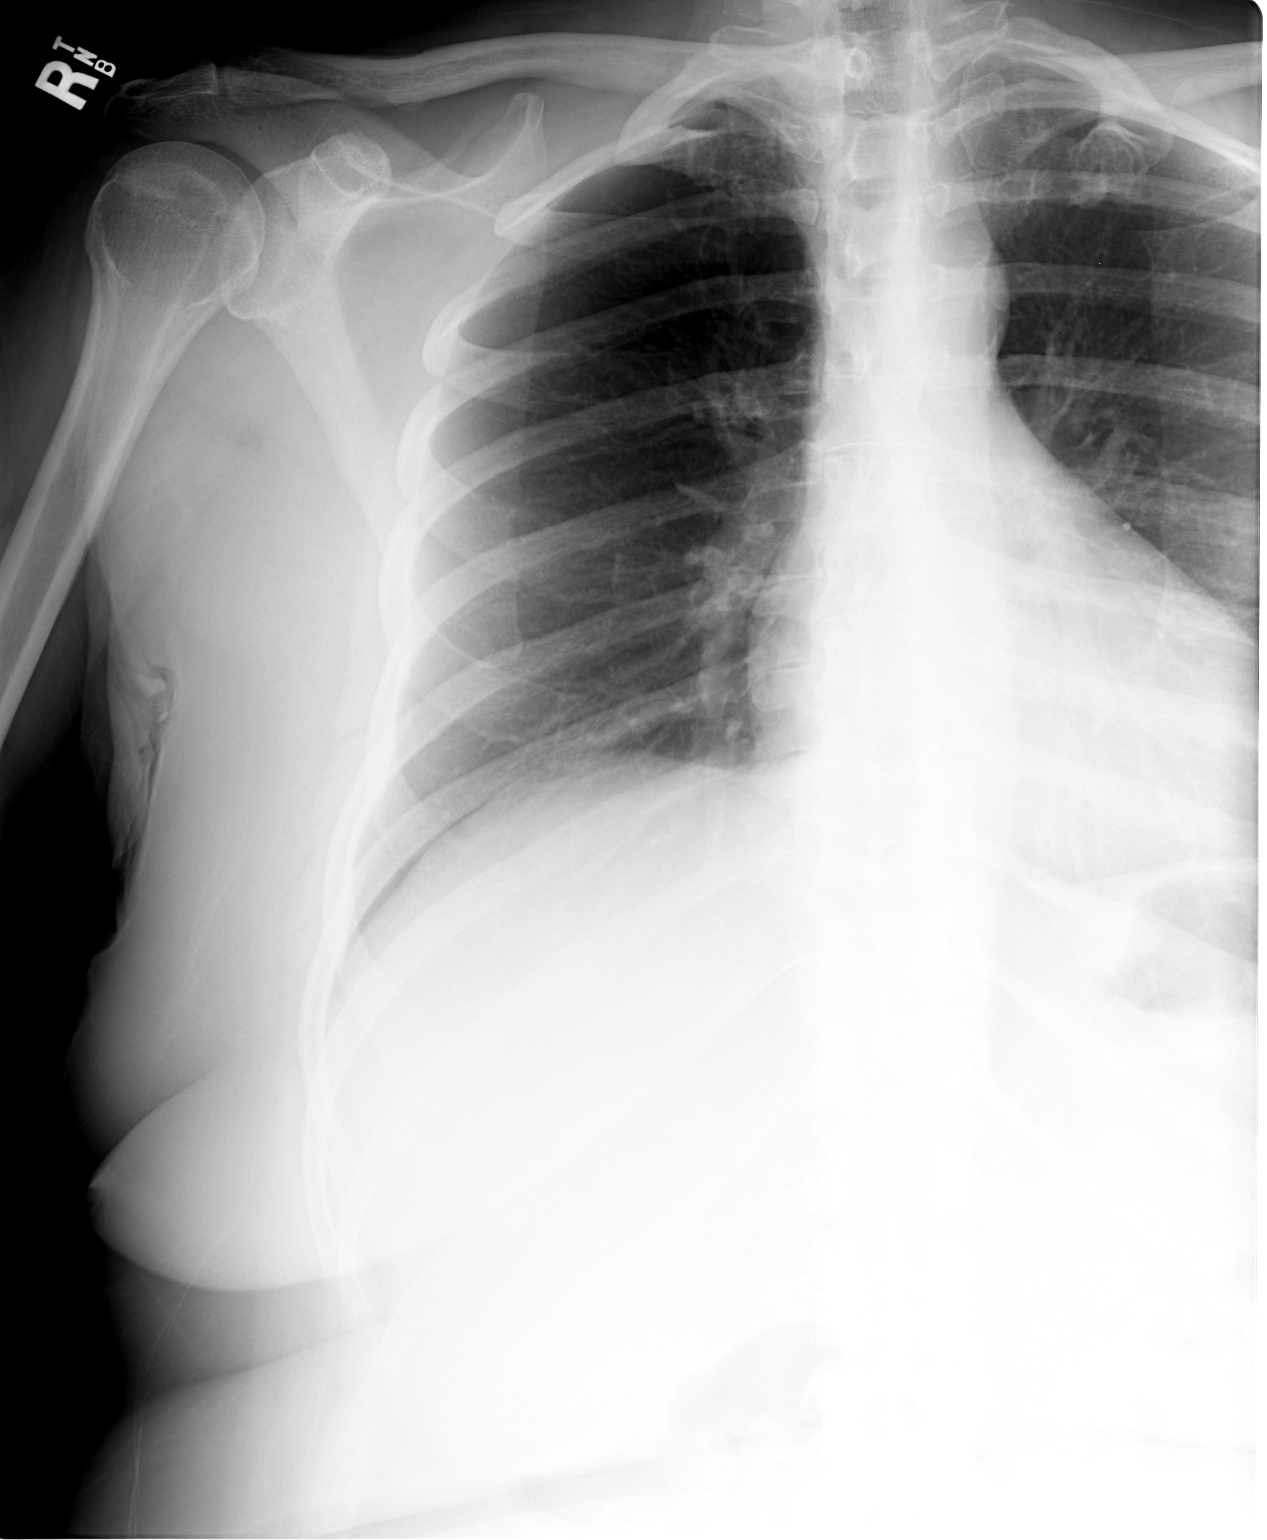

[view not recorded (2 of 3)]
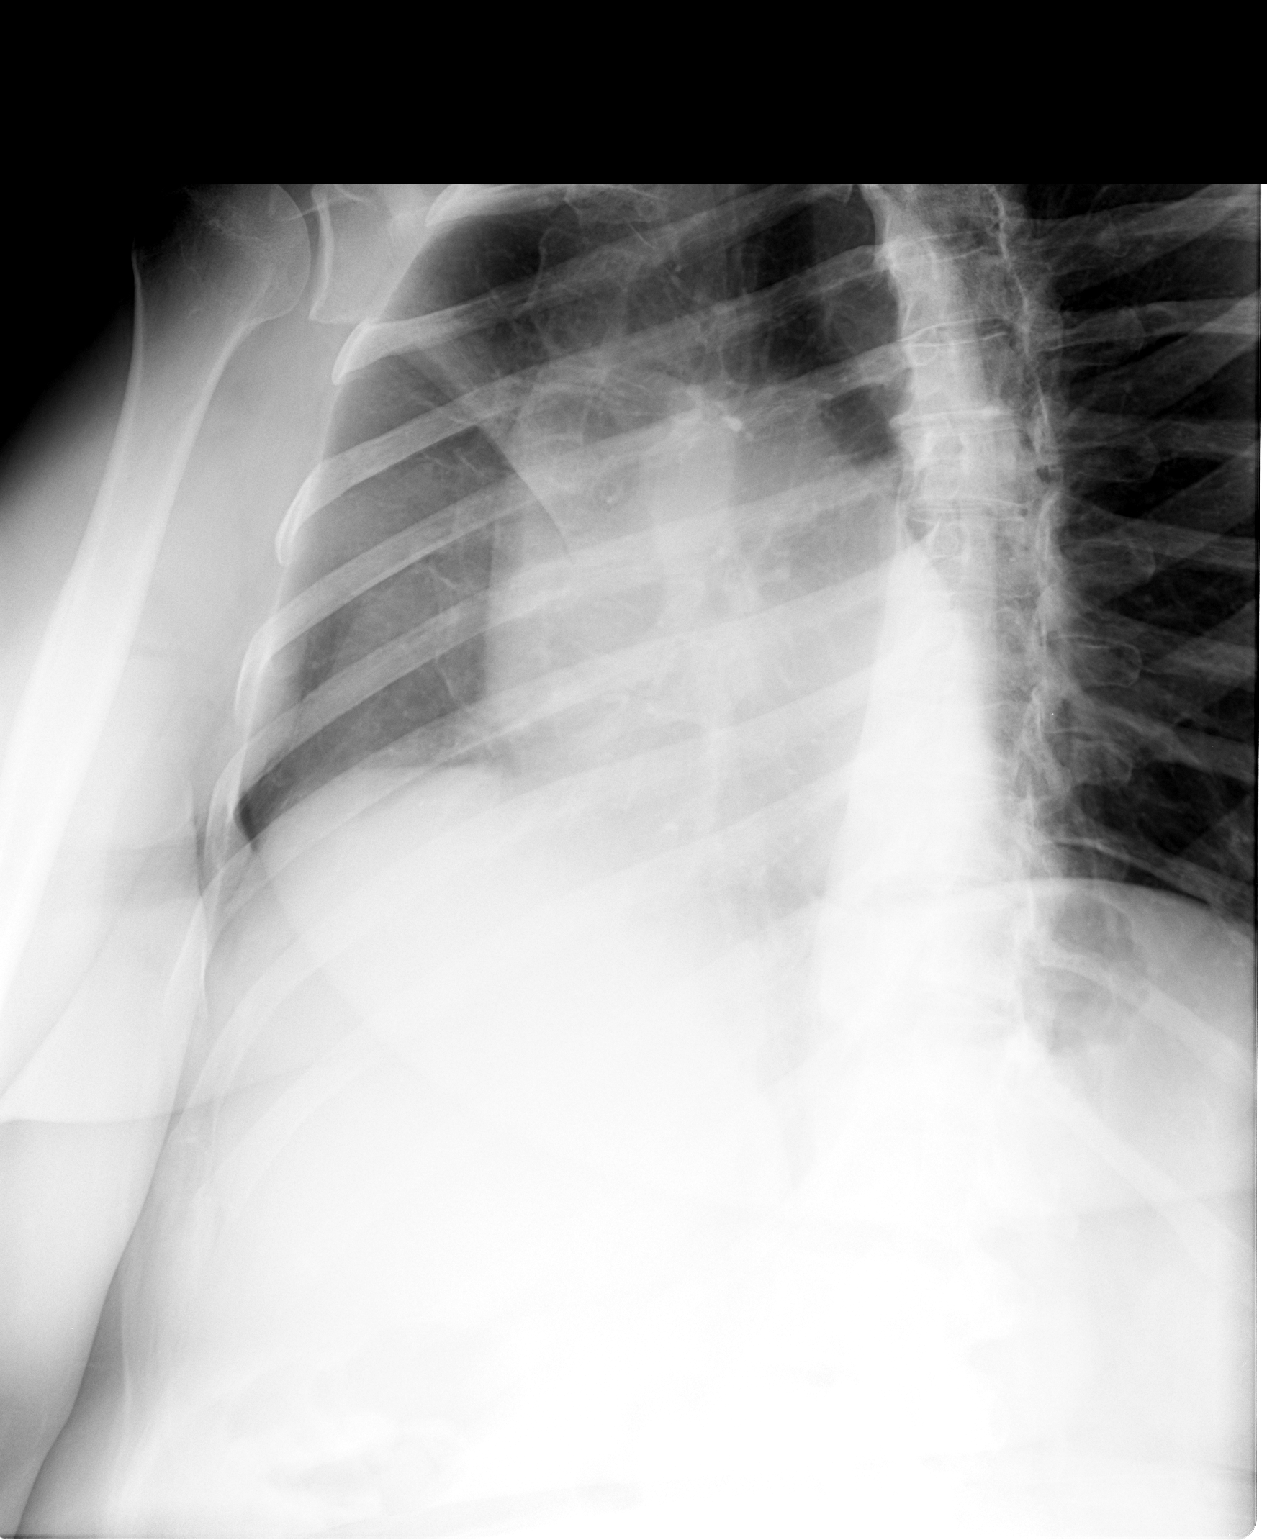

[view not recorded (3 of 3)]
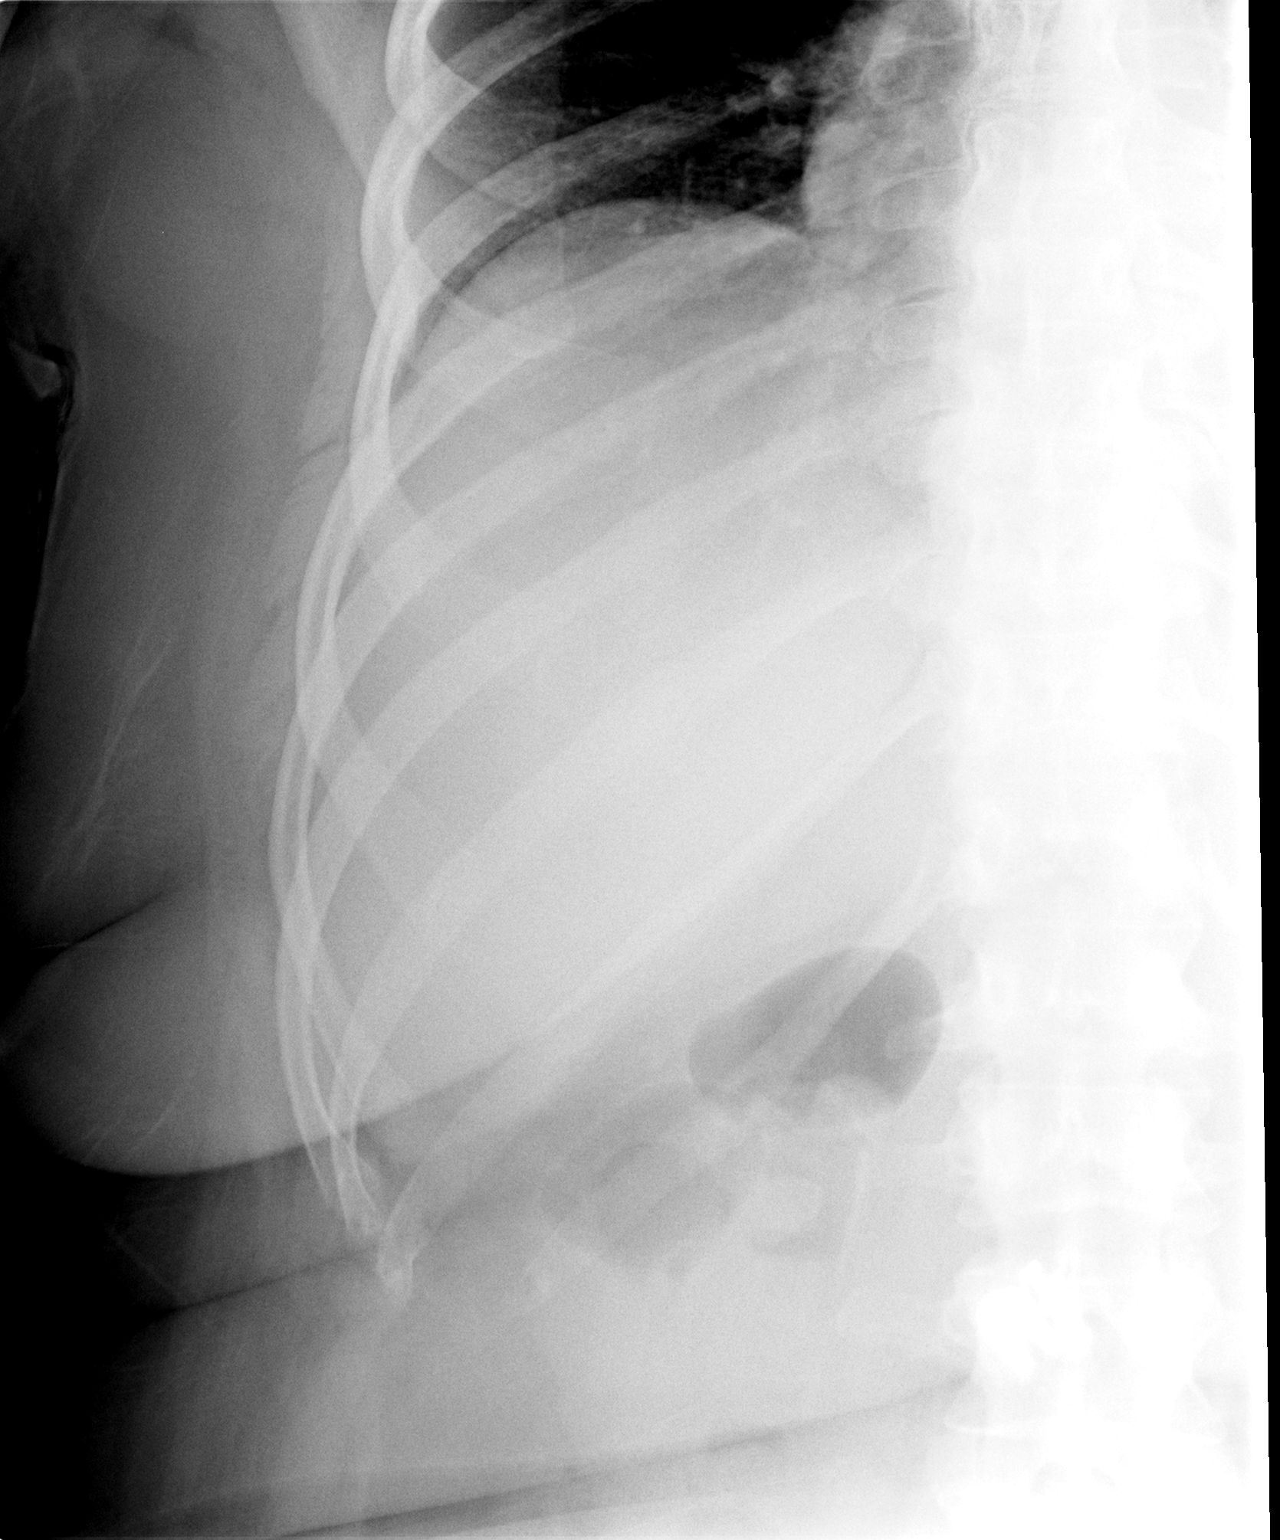

[3 of 3 positions shown; findings below may reference images not displayed]

FINDINGS: No right rib abnormalities are identified.
There is no evidence of acute fracture or focal bony lesion.
The right hemithorax is within normal limits.
IMPRESSION: Unremarkable exam.

## 2011-04-03 IMAGING — CR DG CHEST 2V
2 series · 2 of 2 positions shown · non-contrast
Comparison: [DATE]

CLINICAL DATA: Right chest pain.

CHEST - 2 VIEW

[view not recorded (1 of 2)]
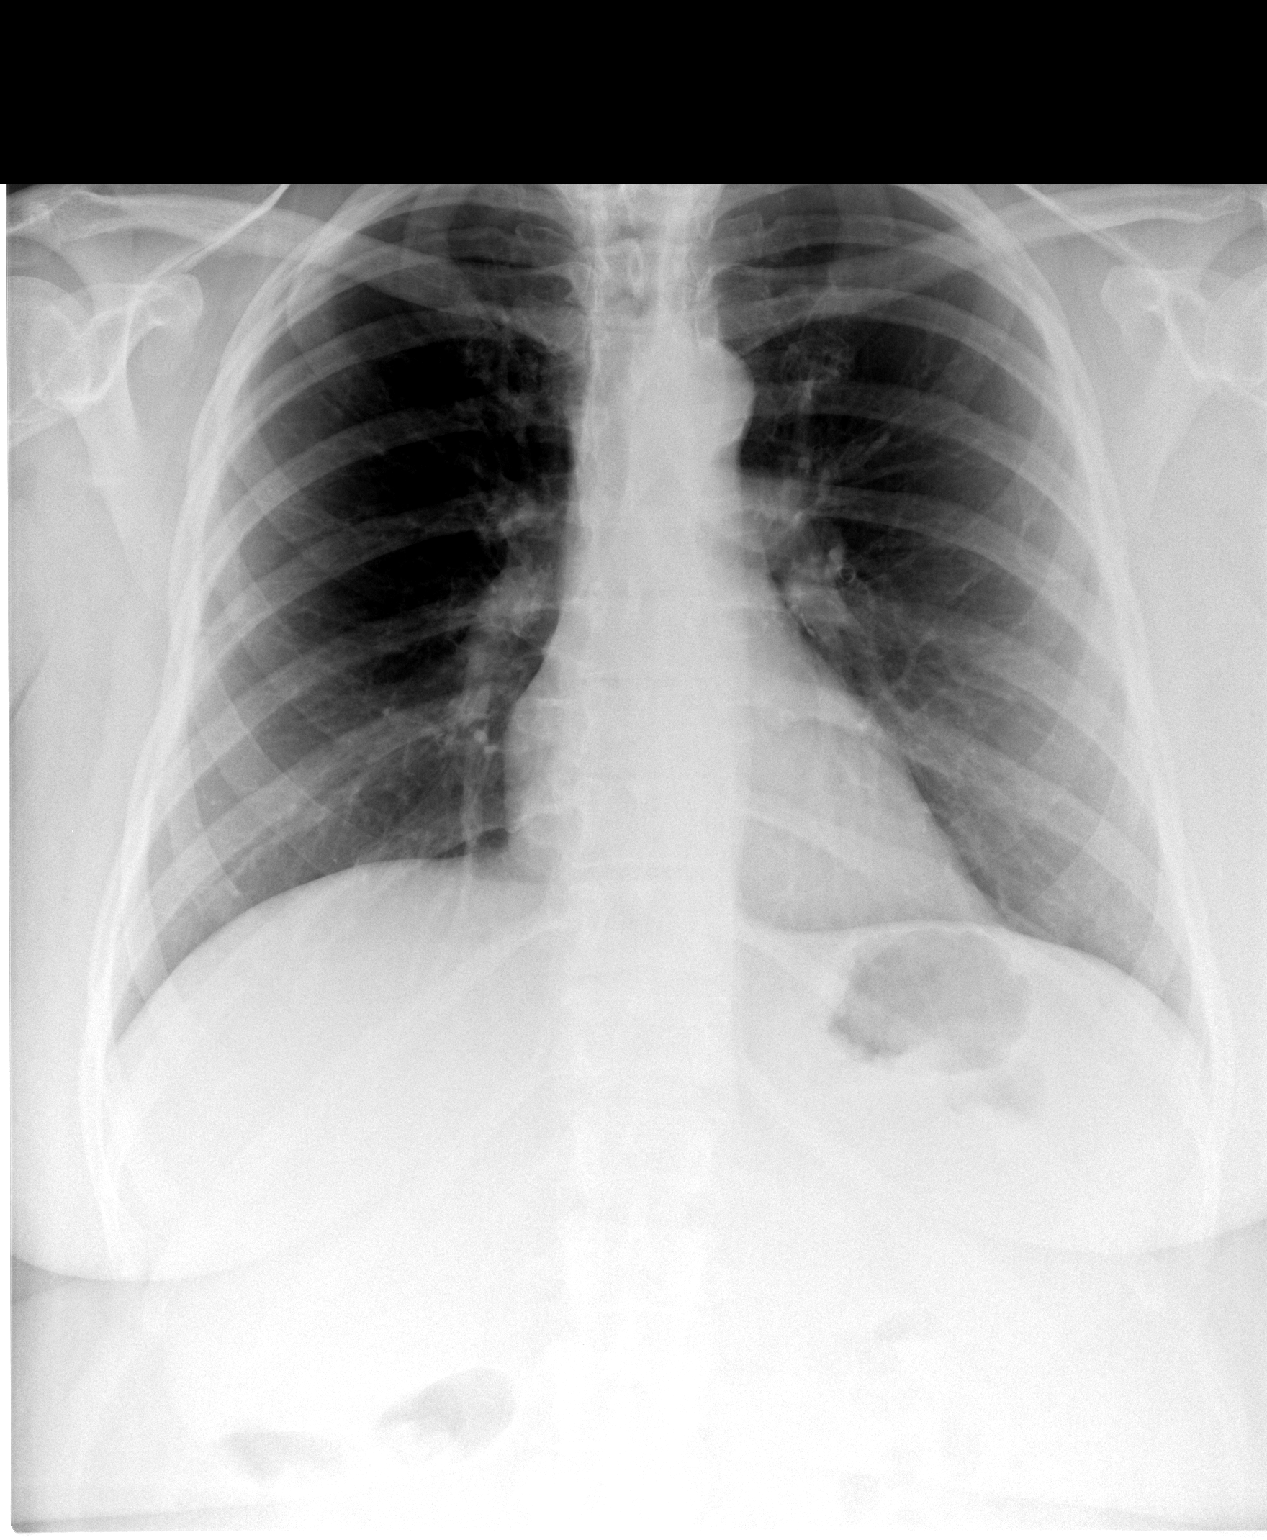

[view not recorded (2 of 2)]
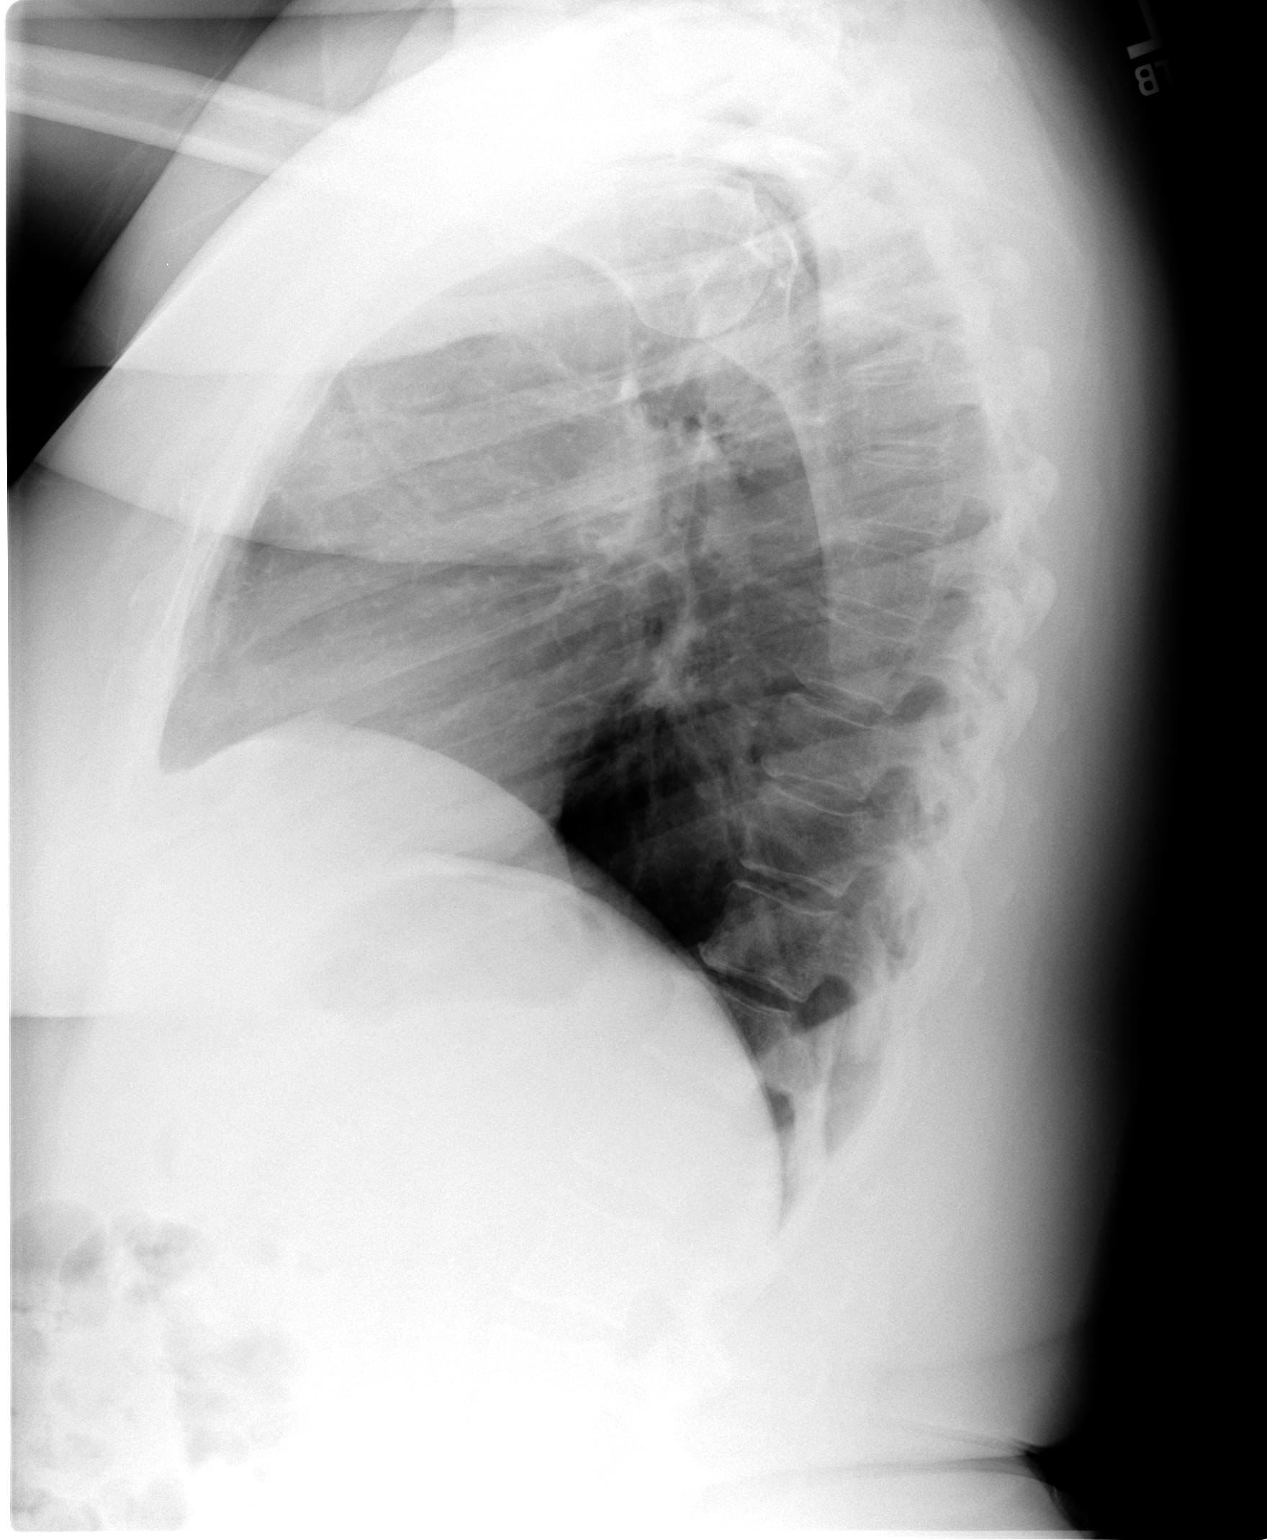

[2 of 2 positions shown; findings below may reference images not displayed]

FINDINGS: The cardiomediastinal silhouette is unremarkable.
The lungs are clear.
There is no evidence of focal airspace disease, pulmonary edema,
pleural effusion, or pneumothorax.
No acute bony abnormalities are identified.
IMPRESSION: No evidence of acute cardiopulmonary disease.

## 2011-04-03 MED ORDER — NAPROXEN 500 MG PO TABS: 500.0000 mg | ORAL_TABLET | Freq: Two times a day (BID) | ORAL | Status: DC

## 2011-04-03 MED ORDER — HYDROCODONE-ACETAMINOPHEN 5-325 MG PO TABS: 1.0000 | ORAL_TABLET | Freq: Four times a day (QID) | ORAL | Status: AC | PRN

## 2011-04-03 NOTE — ED Notes (Signed)
A&ox4; in no distress; instructions and prescriptions reviewed-verbalizes understanding.

## 2011-04-03 NOTE — ED Notes (Signed)
C/o sore throat and nasal congestion x 2 days.  ?

## 2011-04-03 NOTE — ED Provider Notes (Signed)
History     CSN: [id number]  Arrival date & time 04/03/11  0407   First MD Initiated Contact with Patient 04/03/11 [phone]      Chief Complaint  Patient presents with  . Sore Throat  . Nasal Congestion    (Consider location/radiation/quality/duration/timing/severity/associated sxs/prior treatment) Patient is a 66 y.o. female presenting with pharyngitis. The history is provided by the patient.  Sore Throat This is a new problem. The current episode started 2 days ago. The problem occurs constantly. The problem has not changed since onset.Pertinent negatives include no chest pain, no abdominal pain, no headaches and no shortness of breath. The symptoms are aggravated by nothing. The symptoms are relieved by nothing. She has tried nothing for the symptoms.   Positive sick contact with spouse with similar upper respiratory infection illness. No nausea no vomiting no diarrhea no fever. No history of strep throat.   Past Medical History  Diagnosis Date  . High cholesterol     Past Surgical History  Procedure Date  . Rotator cuff repair   . Abdominal hysterectomy   . Breast reduction surgery   . Bladder mesh     No family history on file.  History  Substance Use Topics  . Smoking status: Never Smoker   . Smokeless tobacco: Not on file  . Alcohol Use: No    OB History    Grav Para Term Preterm Abortions TAB SAB Ect Mult Living                  Review of Systems  Constitutional: Negative for fever and chills.  HENT: Positive for congestion and sore throat. Negative for trouble swallowing, neck pain and neck stiffness.   Eyes: Negative for redness.  Respiratory: Negative for cough and shortness of breath.   Cardiovascular: Negative for chest pain.  Gastrointestinal: Negative for nausea, vomiting, abdominal pain and diarrhea.  Genitourinary: Negative for dysuria.  Musculoskeletal: Negative for back pain.  Skin: Negative for rash.  Neurological: Negative for headaches.    Hematological: Does not bruise/bleed easily.    Allergies  Penicillins  Home Medications   Current Outpatient Rx  Name Route Sig Dispense Refill  . ESTROGENS CONJ SYNTHETIC A 0.625 MG PO TABS Oral Take 0.625 mg by mouth daily.    Marland Kitchen SIMVASTATIN 20 MG PO TABS Oral Take 20 mg by mouth every evening.    Marland Kitchen HYDROCODONE-ACETAMINOPHEN 5-325 MG PO TABS Oral Take 1-2 tablets by mouth every 6 (six) hours as needed for pain. 10 tablet 0  . NAPROXEN 500 MG PO TABS Oral Take 1 tablet (500 mg total) by mouth 2 (two) times daily. 14 tablet 0    BP 138/63  Pulse 90  Temp(Src) 97.8 F (36.6 C) (Oral)  Resp 20  Ht 5\' 6"  (1.676 m)  Wt 200 lb (90.719 kg)  BMI 32.28 kg/m2  SpO2 99%  Physical Exam  Nursing note and vitals reviewed. Constitutional: She is oriented to person, place, and time. She appears well-developed.  HENT:  Head: Normocephalic and atraumatic.  Right Ear: External ear normal.  Left Ear: External ear normal.  Mouth/Throat: Oropharynx is clear and moist.  Eyes: Conjunctivae and EOM are normal. Pupils are equal, round, and reactive to light.  Neck: Normal range of motion. Neck supple.  Cardiovascular: Normal rate, regular rhythm and normal heart sounds.   No murmur heard. Pulmonary/Chest: Effort normal and breath sounds normal. She has no wheezes. She has no rales.  Abdominal: Soft. Bowel sounds are normal.  There is no tenderness.  Musculoskeletal: Normal range of motion.  Lymphadenopathy:    She has no cervical adenopathy.  Neurological: She is alert and oriented to person, place, and time. No cranial nerve deficit. She exhibits normal muscle tone. Coordination normal.  Skin: Skin is warm. No rash noted.    ED Course  Procedures (including critical care time)   Labs Reviewed  RAPID STREP SCREEN   No results found. Results for orders placed during the hospital encounter of 04/03/11  RAPID STREP SCREEN      Component Value Range   Streptococcus, Group A Screen  (Direct) NEGATIVE  NEGATIVE      1. Upper respiratory infection   2. Pharyngitis       MDM   Positive sick contact with spouse. Rapid strep is negative for strep pharyngitis. Suspect a viral upper respiratory infection and pharyngitis. Patient is nontoxic in no acute distress.        Shelda Jakes, MD 04/03/11 [phone]

## 2011-04-18 IMAGING — CT CT CHEST W/ CM
2 of 4 series · 14 of 36 positions shown, 17 images · IV contrast (Omnipaque 300)
Comparison: Chest radiograph performed 01/12/2009

CLINICAL DATA: Right lateral chest pain at the site of prior lipoma
removal.

CT CHEST WITH CONTRAST
TECHNIQUE: Multidetector CT imaging of the chest was performed
following the standard protocol during bolus administration of
intravenous contrast.
Contrast: 80 mL of Omnipaque 300 IV contrast

[Series 2: chestroutine 5.0 b40f · axial · 0.80mm/px · z∈[-342,-82]mm · 11 of 58 slices shown, 14 images]
[im 3/58  mediastinal]
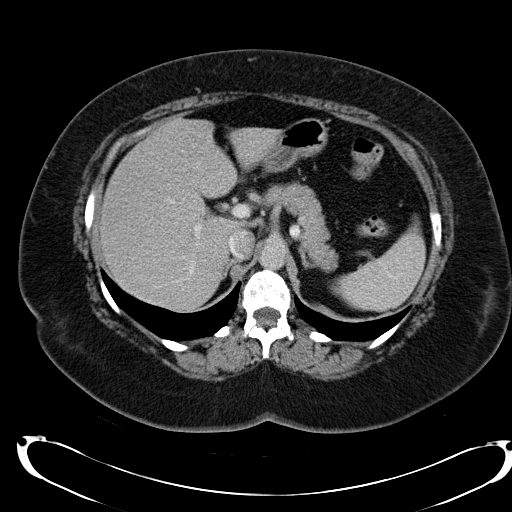
[im 3/58  lung]
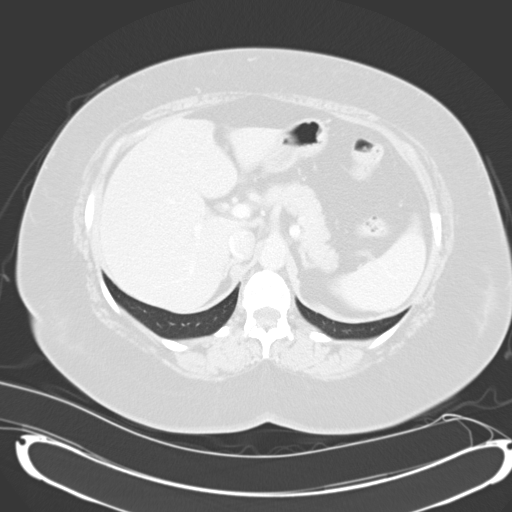
[im 9/58  lung]
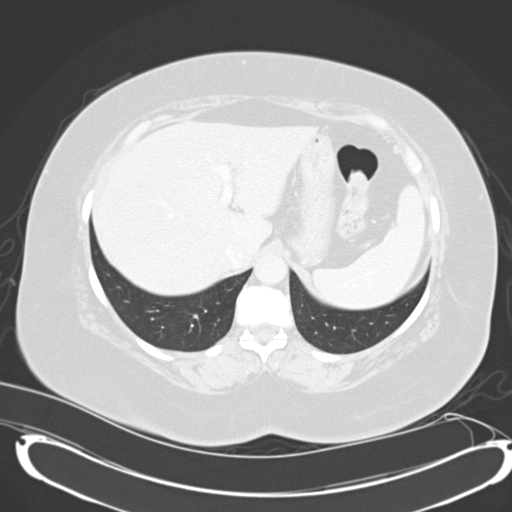
[im 14/58  lung]
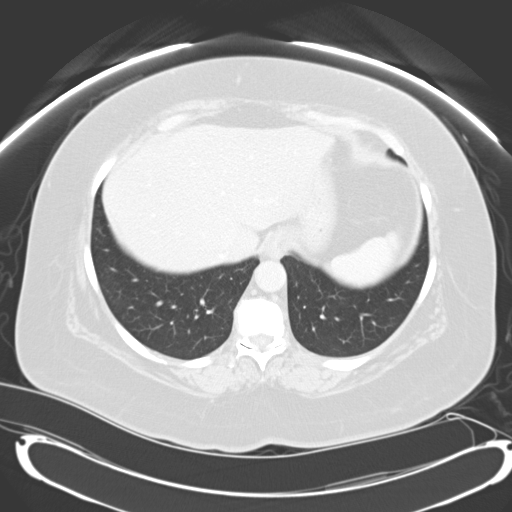
[im 20/58  lung]
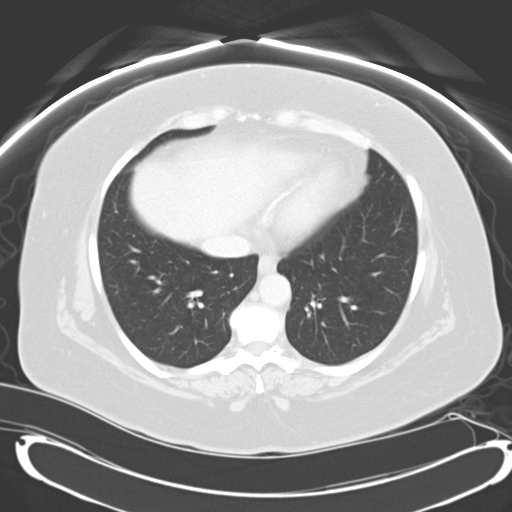
[im 25/58  mediastinal]
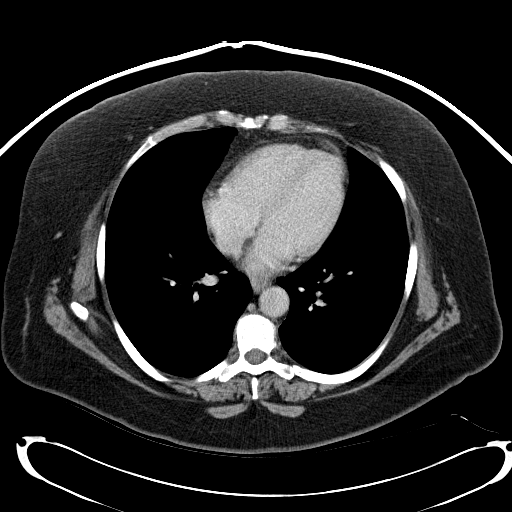
[im 25/58  lung]
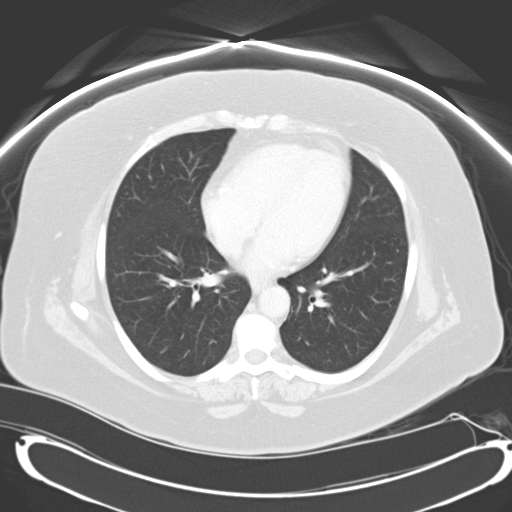
[im 30/58  lung]
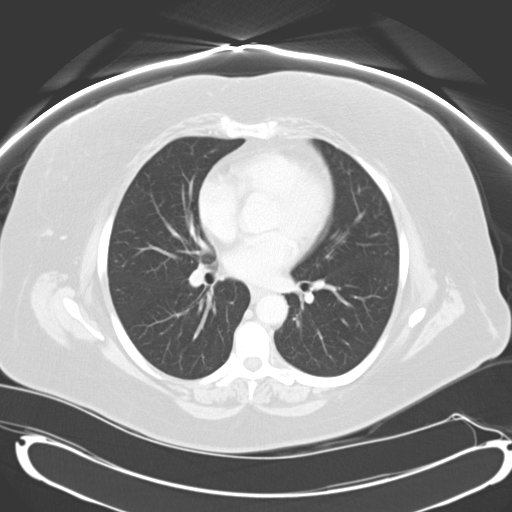
[im 33/58  lung]
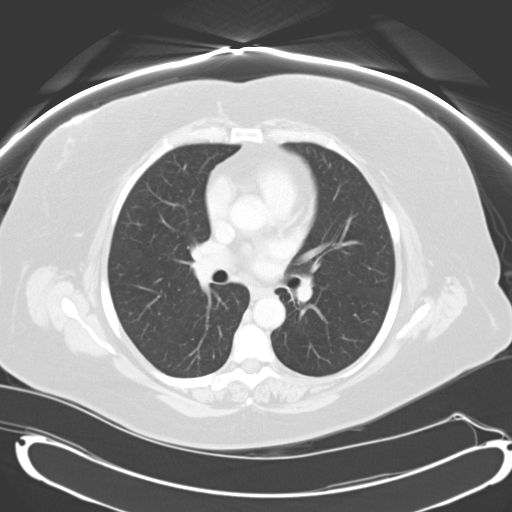
[im 39/58  lung]
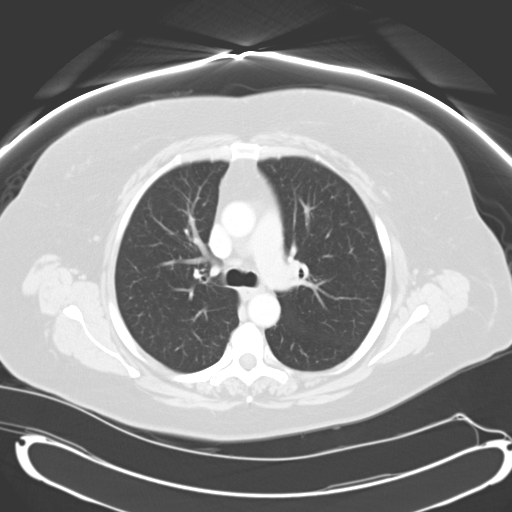
[im 44/58  mediastinal]
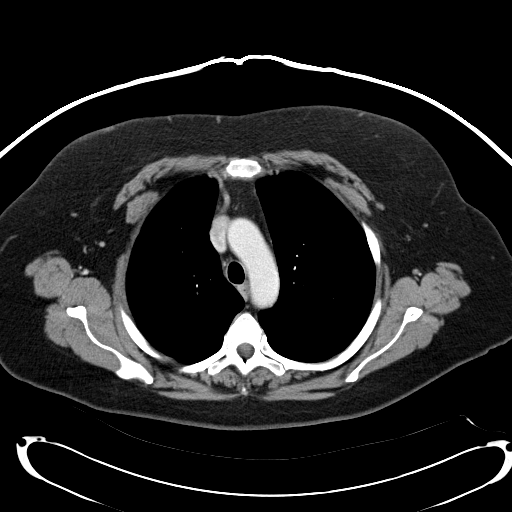
[im 44/58  lung]
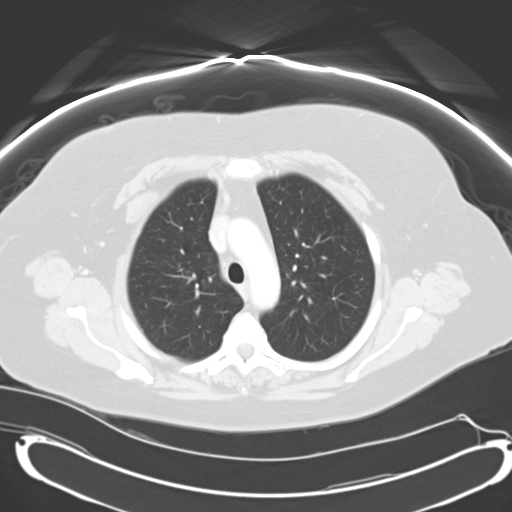
[im 49/58  lung]
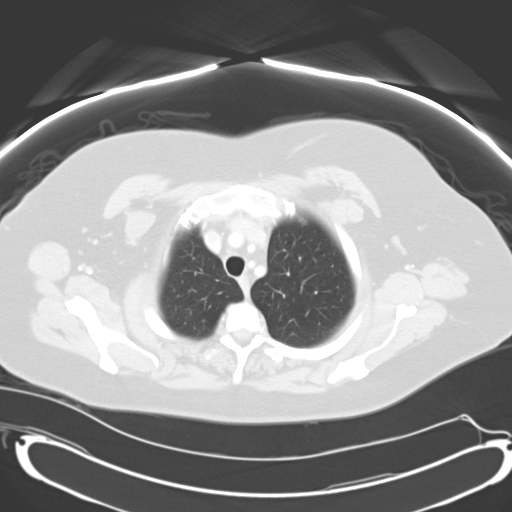
[im 55/58  lung]
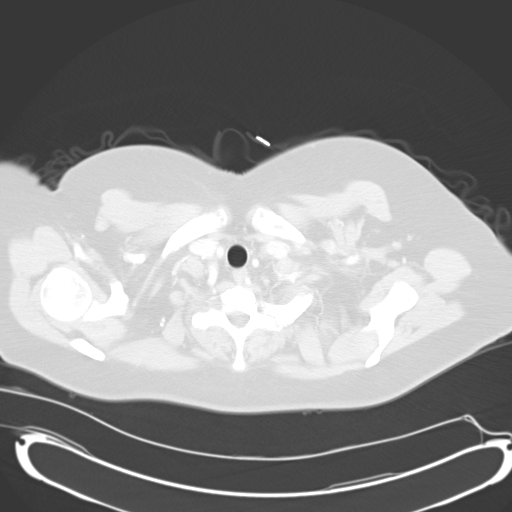

[Series 4: mpr coronal chest 3mm · coronal · 0.56mm/px · 3 of 91 slices shown]
[im 19/91  lung]
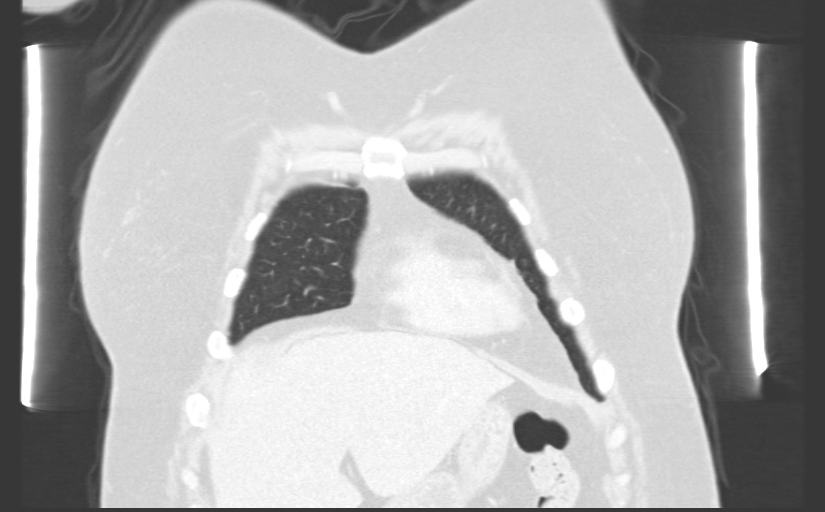
[im 37/91  lung]
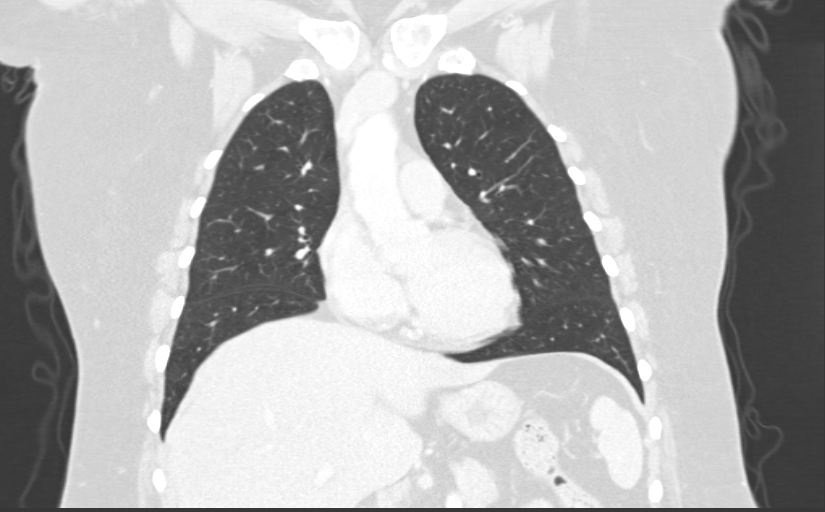
[im 55/91  lung]
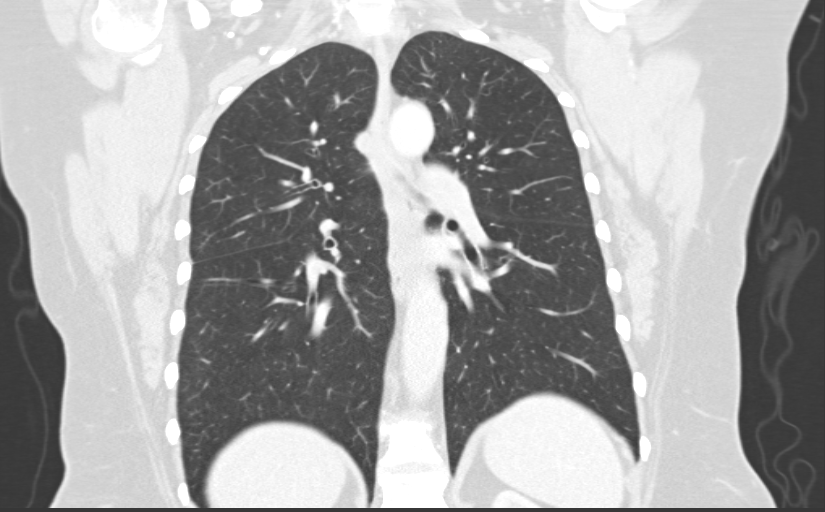

[14 of 36 positions shown; findings below may reference images not displayed]

FINDINGS: The site of prior lipoma removal is not readily
characterized.  No asymmetry is identified within the subcutaneous
fat.  A small portion of the superficial fat along the right
lateral chest wall is incompletely imaged on this study, but no
fluid collection or soft tissue stranding is seen.

There is a tiny 3 mm nodular density at the right lung base (image
41 of 58); this likely reflects atelectasis, on correlation with
coronal and sagittal images. There is also a small 4 mm nodular
density within the anterior right upper lobe (image 21 of 58).  The
visualized lungs are otherwise clear.  No focal consolidation,
pleural effusion or pneumothorax is seen.

The mediastinum is unremarkable in appearance.  No mediastinal
lymphadenopathy is noted.  The great vessels are within normal
limits.  The left vertebral artery is seen arising directly from
the aortic arch.  No axillary lymphadenopathy is noted.  The
visualized portions of the thyroid gland are unremarkable.

The visualized portions of the liver and spleen are unremarkable.
The visualized portions of the adrenal glands and pancreas are
within normal limits.  No acute osseous abnormalities are seen.
IMPRESSION: 1.  Site of prior lipoma removal not readily characterized; no
abnormalities seen within the subcutaneous fat.  A small portion of
subcutaneous tissues along the upper right lateral chest wall is
not imaged on this study.
2.  Two small nodular densities identified within the right lung,
measuring up to 4 mm in size. If the patient is at high risk for
bronchogenic carcinoma, follow-up chest CT at 1 year is
recommended.  If the patient is at low risk, no follow-up is
needed.  This recommendation follows the consensus statement:
"Guidelines for Management of Small Pulmonary Nodules Detected on
CT Scans:  A Statement from the [HOSPITAL]" as published in
[URL]
3.  Left vertebral artery seen arising directly from the aortic
arch.

## 2011-06-16 ENCOUNTER — Encounter (HOSPITAL_COMMUNITY): Payer: Self-pay | Admitting: *Deleted

## 2011-06-16 ENCOUNTER — Emergency Department (HOSPITAL_COMMUNITY): Payer: PRIVATE HEALTH INSURANCE

## 2011-06-16 ENCOUNTER — Emergency Department (HOSPITAL_COMMUNITY)
Admission: EM | Admit: 2011-06-16 | Discharge: 2011-06-16 | Disposition: A | Discharge status: Home / Self Care | Payer: PRIVATE HEALTH INSURANCE | Attending: Emergency Medicine | Admitting: Emergency Medicine

## 2011-06-16 DIAGNOSIS — M549 Dorsalgia, unspecified: Secondary | ICD-10-CM | POA: Insufficient documentation

## 2011-06-16 DIAGNOSIS — M25529 Pain in unspecified elbow: Secondary | ICD-10-CM | POA: Insufficient documentation

## 2011-06-16 DIAGNOSIS — M25569 Pain in unspecified knee: Secondary | ICD-10-CM | POA: Insufficient documentation

## 2011-06-16 DIAGNOSIS — M25539 Pain in unspecified wrist: Secondary | ICD-10-CM | POA: Insufficient documentation

## 2011-06-16 DIAGNOSIS — S8000XA Contusion of unspecified knee, initial encounter: Secondary | ICD-10-CM | POA: Insufficient documentation

## 2011-06-16 DIAGNOSIS — W010XXA Fall on same level from slipping, tripping and stumbling without subsequent striking against object, initial encounter: Secondary | ICD-10-CM | POA: Insufficient documentation

## 2011-06-16 DIAGNOSIS — W19XXXA Unspecified fall, initial encounter: Secondary | ICD-10-CM

## 2011-06-16 DIAGNOSIS — S39012A Strain of muscle, fascia and tendon of lower back, initial encounter: Secondary | ICD-10-CM

## 2011-06-16 DIAGNOSIS — S335XXA Sprain of ligaments of lumbar spine, initial encounter: Secondary | ICD-10-CM | POA: Insufficient documentation

## 2011-06-16 DIAGNOSIS — M25559 Pain in unspecified hip: Secondary | ICD-10-CM | POA: Insufficient documentation

## 2011-06-16 MED ORDER — OXYCODONE-ACETAMINOPHEN 5-325 MG PO TABS
1.0000 | ORAL_TABLET | Freq: Once | ORAL | Status: AC
  Administered 2011-06-16: 1 via ORAL
  Filled 2011-06-16: qty 1

## 2011-06-16 MED ORDER — OXYCODONE-ACETAMINOPHEN 5-325 MG PO TABS: 1.0000 | ORAL_TABLET | Freq: Four times a day (QID) | ORAL | Status: AC | PRN

## 2011-06-16 NOTE — ED Notes (Signed)
Pt c/o fall, pt states that she was walking today in [employer] when she slipped in a puddle of water, pt c/o pain to lower back, left hip, left knee, left ankle, left elbow. Pt denies any loc,

## 2011-06-16 NOTE — Discharge Instructions (Signed)
Follow up next week with your md if needed °

## 2011-06-16 NOTE — ED Provider Notes (Cosign Needed)
History   This chart was scribed for Benny Lennert, MD by Melba Coon. The patient was seen in room APA16A/APA16A and the patient's care was started at 6:35PM.    CSN: [id number]  Arrival date & time 06/16/11  1758   First MD Initiated Contact with Patient 06/16/11 1826      Chief Complaint  Patient presents with  . Fall    (Consider location/radiation/quality/duration/timing/severity/associated sxs/prior treatment) Patient is a 66 y.o. female presenting with fall. The history is provided by the patient.  Fall The accident occurred 1 to 2 hours ago. The fall occurred while walking (slipped in water puddle). She landed on a hard floor. There was no blood loss. The point of impact was the left hip, left elbow, left knee and left wrist. The pain is present in the left hip, left knee, left elbow and left wrist. The pain is moderate. She was ambulatory at the scene. There was no drug use involved in the accident. Pertinent negatives include no visual change, no fever, no nausea, no vomiting, no hematuria, no headaches and no loss of consciousness. The symptoms are aggravated by standing and ambulation. She has tried nothing for the symptoms.  Pt c/o fall, pt states that she was walking today in the grocery store when she slipped in a puddle of water. Pt has Hx of previous left knee surgery. Allergic to penicillins. No other pertinent medical symptoms.  PCP: [facility]  Past Medical History  Diagnosis Date  . High cholesterol     Past Surgical History  Procedure Date  . Rotator cuff repair   . Abdominal hysterectomy   . Breast reduction surgery   . Bladder mesh     No family history on file.  History  Substance Use Topics  . Smoking status: Never Smoker   . Smokeless tobacco: Not on file  . Alcohol Use: No    OB History    Grav Para Term Preterm Abortions TAB SAB Ect Mult Living                  Review of Systems  Constitutional: Negative for fever and chills.   HENT: Negative for nosebleeds and congestion.   Eyes: Negative for photophobia and redness.  Respiratory: Negative for cough and shortness of breath.   Cardiovascular: Negative for chest pain and palpitations.  Gastrointestinal: Negative for nausea, vomiting and diarrhea.  Genitourinary: Negative for dysuria and hematuria.  Musculoskeletal: Positive for back pain. Negative for joint swelling.  Skin: Negative for pallor and rash.  Neurological: Negative for loss of consciousness, light-headedness and headaches.  Psychiatric/Behavioral: Negative for confusion and decreased concentration.   10 Systems reviewed and all are negative for acute change except as noted in the HPI.   Allergies  Penicillins  Home Medications   Current Outpatient Rx  Name Route Sig Dispense Refill  . ESTROGENS CONJ SYNTHETIC A 0.625 MG PO TABS Oral Take 0.625 mg by mouth at bedtime.     Marland Kitchen GLUCOSAMINE-CHONDROITIN 500-400 MG PO TABS Oral Take 2 tablets by mouth at bedtime.    . ADULT MULTIVITAMIN W/MINERALS CH Oral Take 1 tablet by mouth at bedtime.    Marland Kitchen SIMVASTATIN 20 MG PO TABS Oral Take 20 mg by mouth every evening.    Marland Kitchen VITAMIN E PO Oral Take 2 tablets by mouth at bedtime.      BP 125/55  Pulse 78  Temp(Src) 97.6 F (36.4 C) (Oral)  Resp 16  Ht 5\' 6"  (1.676 m)  Wt 200 lb (90.719 kg)  BMI 32.28 kg/m2  SpO2 96%  Physical Exam  Constitutional: She is oriented to person, place, and time. She appears well-developed and well-nourished.  HENT:  Head: Normocephalic and atraumatic.  Eyes: Conjunctivae and EOM are normal. Pupils are equal, round, and reactive to light.  Neck: Normal range of motion. No tracheal deviation present.  Cardiovascular: Normal rate.   No murmur heard. Pulmonary/Chest: Effort normal. She exhibits no tenderness.  Abdominal: Soft. There is no tenderness.  Musculoskeletal: Normal range of motion. She exhibits tenderness (left hip, lumbar spine).  Neurological: She is alert and  oriented to person, place, and time.  Skin: Skin is warm. No rash noted.  Psychiatric: She has a normal mood and affect.    ED Course  Procedures (including critical care time)  DIAGNOSTIC STUDIES: Oxygen Saturation is 98% on room air, normal by my interpretation.    COORDINATION OF CARE:  6:39PM - EDMD will order cervical and lumbar spine, left hip, and left knee XRs for the pt. 8:45PM - recheck; imaging results reviewed; no fx, but arthritis in lower extremities and back; EDMD talks with pt about pain management; EDMD will Rx pain meds. Pt ready for d/c  Labs Reviewed - No data to display Dg Cervical Spine Complete  06/16/2011  *RADIOLOGY REPORT*  Clinical Data: Fall.  Posterior neck pain.  CERVICAL SPINE - COMPLETE 4+ VIEW  Comparison: None.  Findings: There is normal alignment of the cervical spine.  There is no evidence for acute fracture or dislocation.  Prevertebral soft tissues have a normal appearance.  Lung apices have a normal appearance.  IMPRESSION: Negative exam.  Original Report Authenticated By: Patterson Hammersmith, M.D.   Dg Lumbar Spine Complete  06/16/2011  *RADIOLOGY REPORT*  Clinical Data: Diffuse bodyaches.  Fall.  LUMBAR SPINE - COMPLETE 4+ VIEW  Comparison: CT of the abdomen 10/18/2005  Findings: There is grade 1 anterolisthesis of L4 on all there is grade 1 anterolisthesis of L4 on L5. Suspect L5 pars defects.  No evidence for acute fracture.  No lytic or blastic lesion identified.  Regional bowel gas pattern is nonobstructive.  There is superior endplate fracture of L2, likely related to osteoporosis.  IMPRESSION:  1. 1.  Anterolisthesis L4-L5, grade 1. 2.  Suspect L5 pars defects. 3.  Osteoporotic type endplate fracture of L2.  Original Report Authenticated By: Patterson Hammersmith, M.D.   Dg Hip Complete Left  06/16/2011  *RADIOLOGY REPORT*  Clinical Data: Fall.  Diffuse bodyaches.  LEFT HIP - COMPLETE 2+ VIEW  Comparison: None.  Findings: AP and lateral views of the  left hip include AP view of the pelvis. There is no evidence for acute fracture or dislocation. No soft tissue foreign body or gas identified.  IMPRESSION: Negative exam.  Original Report Authenticated By: Patterson Hammersmith, M.D.   Dg Knee Complete 4 Views Left  06/16/2011  *RADIOLOGY REPORT*  Clinical Data: Diffuse bodyaches.  Fell.  History of prior surgery.  LEFT KNEE - COMPLETE 4+ VIEW  Comparison: None.  Findings: There are mild degenerative changes of the knee.  Changes primarily involve the patellofemoral and medial compartments.  No evidence for acute fracture or subluxation.  There may be small joint effusion.  There is soft tissue edema along the anterior aspect of the knee.  IMPRESSION:  1.  Degenerative changes. 2.  Suspect small effusion. 3.  Anterior soft tissue swelling.  Original Report Authenticated By: Patterson Hammersmith, M.D.  No diagnosis found.    MDM  The chart was scribed for me under my direct supervision.  I personally performed the history, physical, and medical decision making and all procedures in the evaluation of this patient.Benny Lennert, MD 06/16/11 2053  Benny Lennert, MD 06/30/11 Mikle Bosworth

## 2011-12-19 ENCOUNTER — Other Ambulatory Visit: Payer: Self-pay | Admitting: Obstetrics & Gynecology

## 2011-12-19 DIAGNOSIS — Z139 Encounter for screening, unspecified: Secondary | ICD-10-CM

## 2012-01-29 ENCOUNTER — Ambulatory Visit (HOSPITAL_COMMUNITY): Payer: PRIVATE HEALTH INSURANCE

## 2012-02-06 ENCOUNTER — Ambulatory Visit (HOSPITAL_COMMUNITY)
Admission: RE | Admit: 2012-02-06 | Discharge: 2012-02-06 | Disposition: A | Discharge status: Home / Self Care | Payer: PRIVATE HEALTH INSURANCE | Source: Ambulatory Visit | Attending: Obstetrics & Gynecology | Admitting: Obstetrics & Gynecology

## 2012-02-06 DIAGNOSIS — Z139 Encounter for screening, unspecified: Secondary | ICD-10-CM

## 2012-02-06 DIAGNOSIS — Z1231 Encounter for screening mammogram for malignant neoplasm of breast: Secondary | ICD-10-CM | POA: Insufficient documentation

## 2012-03-14 ENCOUNTER — Other Ambulatory Visit (HOSPITAL_COMMUNITY): Payer: Self-pay | Admitting: Chiropractic Medicine

## 2012-03-14 DIAGNOSIS — M549 Dorsalgia, unspecified: Secondary | ICD-10-CM

## 2012-03-15 ENCOUNTER — Ambulatory Visit (HOSPITAL_COMMUNITY)
Admission: RE | Admit: 2012-03-15 | Discharge: 2012-03-15 | Disposition: A | Discharge status: Home / Self Care | Payer: PRIVATE HEALTH INSURANCE | Source: Ambulatory Visit | Attending: Chiropractic Medicine | Admitting: Chiropractic Medicine

## 2012-03-15 DIAGNOSIS — M47817 Spondylosis without myelopathy or radiculopathy, lumbosacral region: Secondary | ICD-10-CM | POA: Insufficient documentation

## 2012-03-15 DIAGNOSIS — M545 Low back pain, unspecified: Secondary | ICD-10-CM | POA: Insufficient documentation

## 2012-03-15 DIAGNOSIS — M549 Dorsalgia, unspecified: Secondary | ICD-10-CM

## 2012-04-14 IMAGING — MG MM DIGITAL SCREENING
5 series · 5 of 5 positions shown · non-contrast
Comparison: none

DG SCREEN MAMMOGRAM BILATERAL
Bilateral CC and MLO view(s) were taken.

DIGITAL SCREENING MAMMOGRAM WITH CAD:
There are scattered fibroglandular densities.  No masses or malignant type calcifications are 
identified.  Compared with prior studies.
Images were processed with CAD.

[L CC]
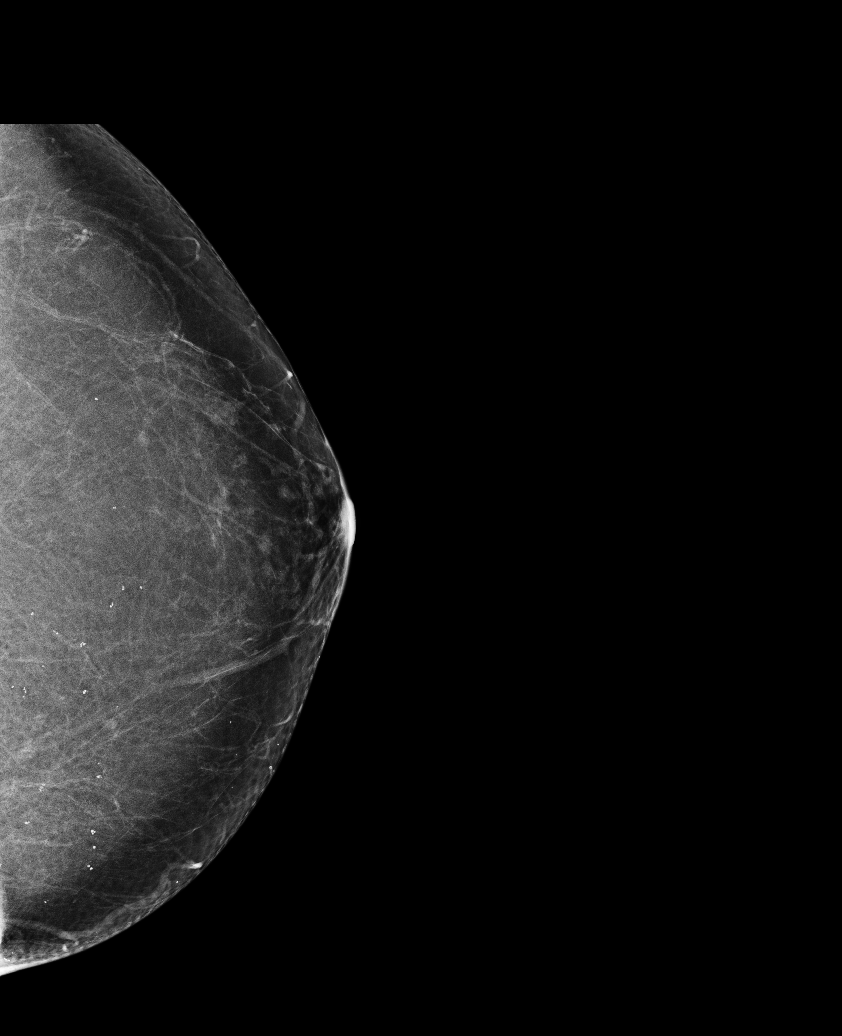

[L MLO]
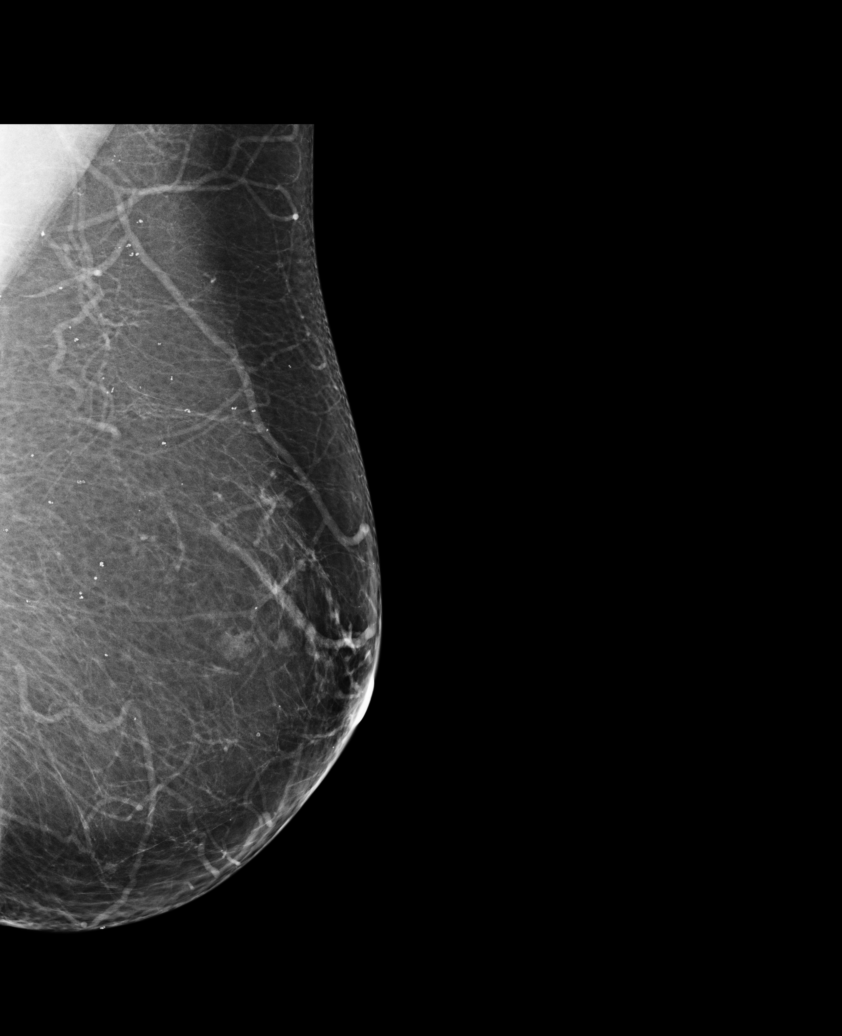

[R CC]
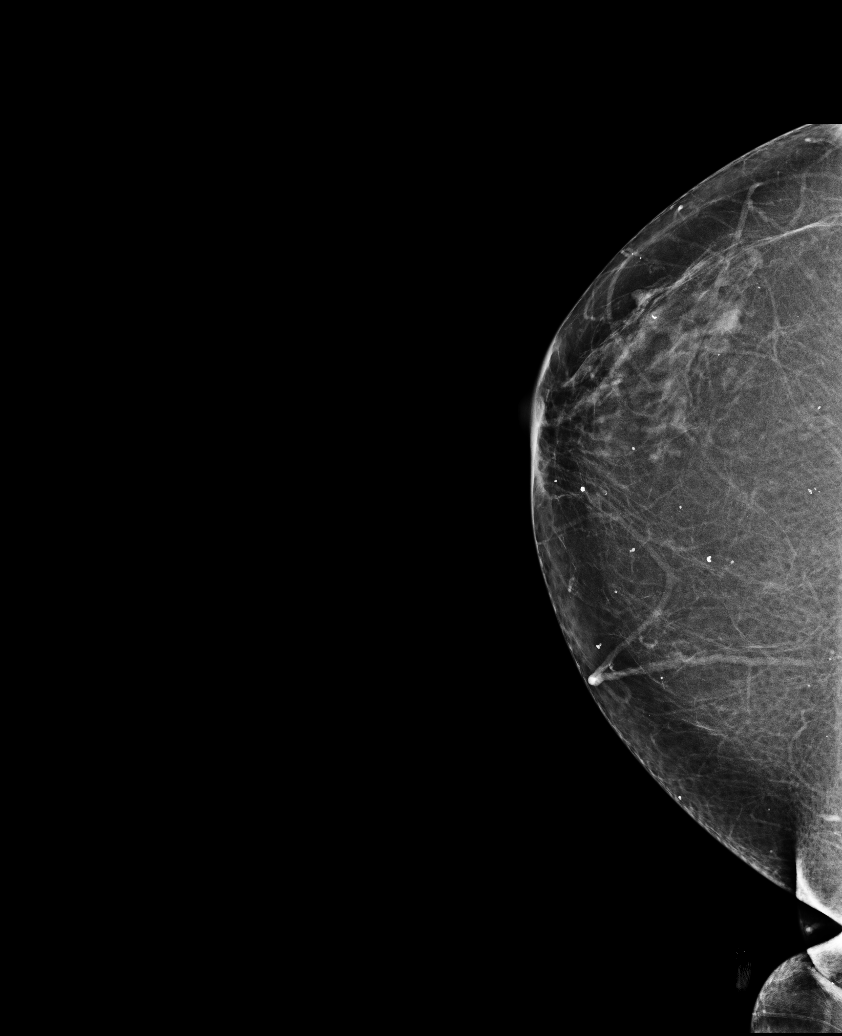

[R MLO (1 of 2)]
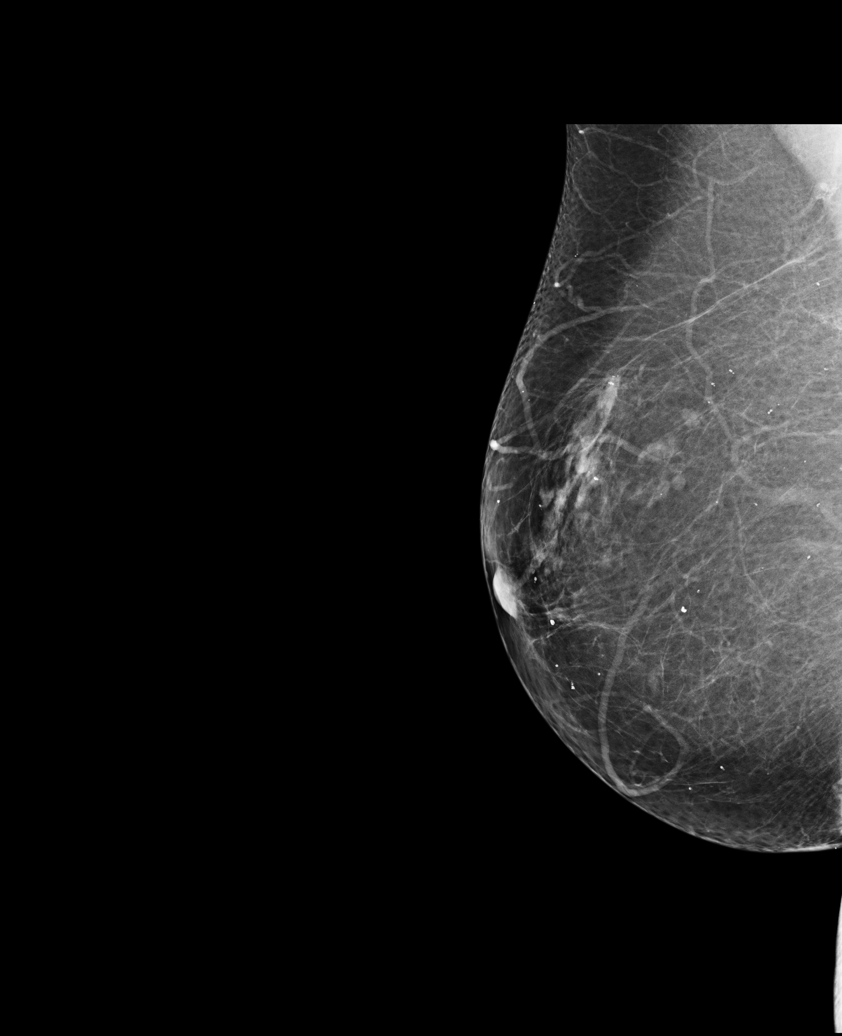

[R MLO (2 of 2)]
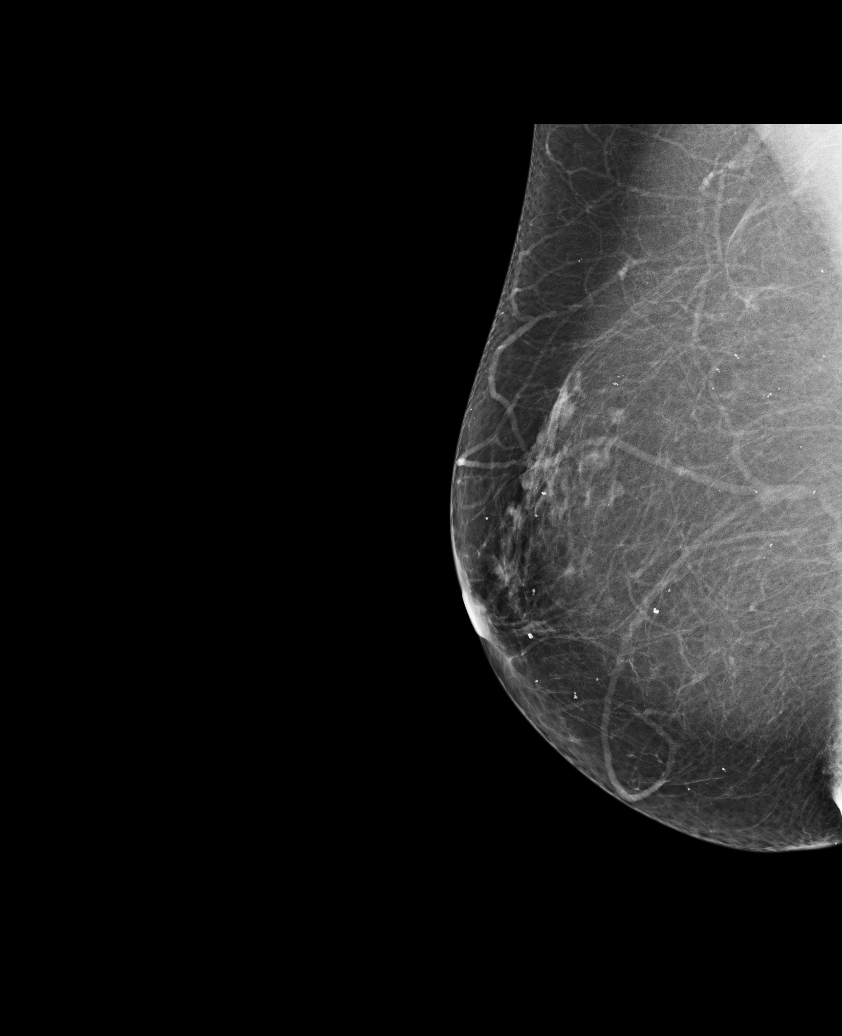

[5 of 5 positions shown; findings below may reference images not displayed]

IMPRESSION: No specific mammographic evidence of malignancy.  Next screening mammogram is recommended in one 
year.

A result letter of this screening mammogram will be mailed directly to the patient.

ASSESSMENT: Negative - BI-RADS 1

Screening mammogram in 1 year.
,

## 2012-05-29 ENCOUNTER — Encounter: Payer: Self-pay | Admitting: *Deleted

## 2012-05-29 ENCOUNTER — Telehealth: Payer: Self-pay | Admitting: Adult Health

## 2012-05-29 NOTE — Telephone Encounter (Signed)
Spoke with pt. Burning with urination, urinary frequency x 2 days. Saw blood in urine today. Had the same problem last year and wanted something called in but I advised would need to be seen since it had been so long. Appt scheduled for today.

## 2012-05-30 ENCOUNTER — Encounter: Payer: Self-pay | Admitting: Obstetrics & Gynecology

## 2012-05-30 ENCOUNTER — Telehealth: Payer: Self-pay | Admitting: *Deleted

## 2012-05-30 ENCOUNTER — Ambulatory Visit (INDEPENDENT_AMBULATORY_CARE_PROVIDER_SITE_OTHER): Payer: PRIVATE HEALTH INSURANCE | Admitting: Obstetrics & Gynecology

## 2012-05-30 VITALS — BP 120/80 | Temp 97.6°F | Ht 66.0 in | Wt 220.0 lb

## 2012-05-30 DIAGNOSIS — N390 Urinary tract infection, site not specified: Secondary | ICD-10-CM

## 2012-05-30 DIAGNOSIS — R300 Dysuria: Secondary | ICD-10-CM

## 2012-05-30 LAB — POCT URINALYSIS DIPSTICK
Blood, UA: 3
Protein, UA: 1

## 2012-05-30 MED ORDER — SULFAMETHOXAZOLE-TRIMETHOPRIM 800-160 MG PO TABS: 1.0000 | ORAL_TABLET | Freq: Two times a day (BID) | ORAL | Status: DC

## 2012-05-30 NOTE — Addendum Note (Signed)
Addended by: Criss Alvine on: 05/30/2012 10:04 AM   Modules accepted: Orders

## 2012-05-30 NOTE — Patient Instructions (Signed)
Urinary Tract Infection Urinary tract infections (UTIs) can develop anywhere along your urinary tract. Your urinary tract is your body's drainage system for removing wastes and extra water. Your urinary tract includes two kidneys, two ureters, a bladder, and a urethra. Your kidneys are a pair of bean-shaped organs. Each kidney is about the size of your fist. They are located below your ribs, one on each side of your spine. CAUSES Infections are caused by microbes, which are microscopic organisms, including fungi, viruses, and bacteria. These organisms are so small that they can only be seen through a microscope. Bacteria are the microbes that most commonly cause UTIs. SYMPTOMS  Symptoms of UTIs may vary by age and gender of the patient and by the location of the infection. Symptoms in young women typically include a frequent and intense urge to urinate and a painful, burning feeling in the bladder or urethra during urination. Older women and men are more likely to be tired, shaky, and weak and have muscle aches and abdominal pain. A fever may mean the infection is in your kidneys. Other symptoms of a kidney infection include pain in your back or sides below the ribs, nausea, and vomiting. DIAGNOSIS To diagnose a UTI, your caregiver will ask you about your symptoms. Your caregiver also will ask to provide a urine sample. The urine sample will be tested for bacteria and white blood cells. White blood cells are made by your body to help fight infection. TREATMENT  Typically, UTIs can be treated with medication. Because most UTIs are caused by a bacterial infection, they usually can be treated with the use of antibiotics. The choice of antibiotic and length of treatment depend on your symptoms and the type of bacteria causing your infection. HOME CARE INSTRUCTIONS  If you were prescribed antibiotics, take them exactly as your caregiver instructs you. Finish the medication even if you feel better after you  have only taken some of the medication.  Drink enough water and fluids to keep your urine clear or pale yellow.  Avoid caffeine, tea, and carbonated beverages. They tend to irritate your bladder.  Empty your bladder often. Avoid holding urine for long periods of time.  Empty your bladder before and after sexual intercourse.  After a bowel movement, women should cleanse from front to back. Use each tissue only once. SEEK MEDICAL CARE IF:   You have back pain.  You develop a fever.  Your symptoms do not begin to resolve within 3 days. SEEK IMMEDIATE MEDICAL CARE IF:   You have severe back pain or lower abdominal pain.  You develop chills.  You have nausea or vomiting.  You have continued burning or discomfort with urination. MAKE SURE YOU:   Understand these instructions.  Will watch your condition.  Will get help right away if you are not doing well or get worse. Document Released: 11/23/2004 Document Revised: 08/15/2011 Document Reviewed: 03/24/2011 [employer] Patient Information 2013 [employer].  

## 2012-05-30 NOTE — Progress Notes (Signed)
Patient ID: Stacy Herrera, female   DOB: 1945-08-02, 67 y.o.   MRN: [id number] Urinary Tract Infection Patient complains of frequency, hematuria, incontinence, suprapubic pressure and urgency. She has had symptoms for 2 days. Patient also complains of none. Patient denies back pain. Patient does not have a history of recurrent UTI. Patient does not have a history of pyelonephritis.   Exam: Urinalysis +nitrates Abdomen benign  Imp: Uncomplicated UTI  Plan: Bactrim DS 1 po BID x BID  EURE,LUTHER H 05/30/2012 9:38 AM

## 2012-05-30 NOTE — Telephone Encounter (Signed)
Pt at [facility] [facility] pharmacy stated have not got her RX for Bactrim DS for UTI. Gave verbal order to pharmacist by phone for Bactrim as prescribed by Dr. Despina Hidden in patients chart.

## 2012-06-01 LAB — URINE CULTURE: Colony Count: >=100000

## 2012-06-03 ENCOUNTER — Telehealth: Payer: Self-pay | Admitting: Obstetrics & Gynecology

## 2012-06-03 NOTE — Telephone Encounter (Signed)
Called and gave urine culture report.  Sens to bactrim

## 2012-06-19 ENCOUNTER — Telehealth: Payer: Self-pay | Admitting: *Deleted

## 2012-06-19 MED ORDER — ESTRADIOL 0.075 MG/24HR TD PTTW: 1.0000 | MEDICATED_PATCH | TRANSDERMAL | Status: DC

## 2012-06-19 NOTE — Telephone Encounter (Signed)
Complains of hot flashes, ?depressed, wants to try estrogen patch, will give sample os minivelle 0.075 mg Number of samples 8 patches Lot number [id number]     Exp date 8/24.

## 2012-06-19 NOTE — Telephone Encounter (Signed)
Pt states having hot flashes, stopped taking hormone pill in January, would like to go on the "patch". Can you call RX in for patch?

## 2012-06-26 ENCOUNTER — Other Ambulatory Visit (HOSPITAL_COMMUNITY): Payer: Self-pay | Admitting: Physician Assistant

## 2012-06-26 DIAGNOSIS — Z0000 Encounter for general adult medical examination without abnormal findings: Secondary | ICD-10-CM

## 2012-06-28 ENCOUNTER — Ambulatory Visit (HOSPITAL_COMMUNITY)
Admission: RE | Admit: 2012-06-28 | Discharge: 2012-06-28 | Disposition: A | Discharge status: Home / Self Care | Payer: PRIVATE HEALTH INSURANCE | Source: Ambulatory Visit | Attending: Physician Assistant | Admitting: Physician Assistant

## 2012-06-28 DIAGNOSIS — Z780 Asymptomatic menopausal state: Secondary | ICD-10-CM | POA: Insufficient documentation

## 2012-06-28 DIAGNOSIS — Z0000 Encounter for general adult medical examination without abnormal findings: Secondary | ICD-10-CM

## 2012-07-04 ENCOUNTER — Other Ambulatory Visit: Payer: Self-pay | Admitting: Adult Health

## 2012-07-04 ENCOUNTER — Telehealth: Payer: Self-pay | Admitting: Adult Health

## 2012-07-04 MED ORDER — ESTRADIOL 0.075 MG/24HR TD PTTW: 1.0000 | MEDICATED_PATCH | TRANSDERMAL | Status: DC

## 2012-07-04 NOTE — Telephone Encounter (Signed)
Left message, that I refilled patch

## 2012-08-27 ENCOUNTER — Telehealth: Payer: Self-pay | Admitting: Adult Health

## 2012-08-27 ENCOUNTER — Other Ambulatory Visit: Payer: Self-pay | Admitting: Adult Health

## 2012-08-27 NOTE — Telephone Encounter (Signed)
Legs started cramping with minivelle, but went back on cenestin andthey are fine will refill cenestin

## 2012-08-27 NOTE — Telephone Encounter (Signed)
Left message to call me back.

## 2012-10-07 ENCOUNTER — Other Ambulatory Visit: Payer: Self-pay | Admitting: *Deleted

## 2012-10-07 ENCOUNTER — Telehealth: Payer: Self-pay | Admitting: Adult Health

## 2012-10-07 MED ORDER — ESTRADIOL 0.5 MG PO TABS: 0.5000 mg | ORAL_TABLET | Freq: Every day | ORAL | Status: DC

## 2012-10-07 NOTE — Telephone Encounter (Signed)
She can not get cenestin anymore and has been off for 5 days and wants something else, will try estradiol 0.5 mg

## 2013-01-02 ENCOUNTER — Other Ambulatory Visit: Payer: Self-pay | Admitting: Obstetrics & Gynecology

## 2013-01-02 DIAGNOSIS — Z139 Encounter for screening, unspecified: Secondary | ICD-10-CM

## 2013-02-07 ENCOUNTER — Ambulatory Visit (HOSPITAL_COMMUNITY)
Admission: RE | Admit: 2013-02-07 | Discharge: 2013-02-07 | Disposition: A | Discharge status: Home / Self Care | Payer: PRIVATE HEALTH INSURANCE | Source: Ambulatory Visit | Attending: Obstetrics & Gynecology | Admitting: Obstetrics & Gynecology

## 2013-02-07 DIAGNOSIS — Z1231 Encounter for screening mammogram for malignant neoplasm of breast: Secondary | ICD-10-CM | POA: Insufficient documentation

## 2013-02-07 DIAGNOSIS — Z139 Encounter for screening, unspecified: Secondary | ICD-10-CM

## 2013-03-24 ENCOUNTER — Encounter: Payer: Self-pay | Admitting: Adult Health

## 2013-03-24 ENCOUNTER — Encounter (INDEPENDENT_AMBULATORY_CARE_PROVIDER_SITE_OTHER): Payer: Self-pay

## 2013-03-24 ENCOUNTER — Ambulatory Visit (INDEPENDENT_AMBULATORY_CARE_PROVIDER_SITE_OTHER): Payer: PRIVATE HEALTH INSURANCE | Admitting: Adult Health

## 2013-03-24 VITALS — BP 126/80 | HR 74 | Ht 65.0 in | Wt 222.0 lb

## 2013-03-24 DIAGNOSIS — R32 Unspecified urinary incontinence: Secondary | ICD-10-CM | POA: Insufficient documentation

## 2013-03-24 DIAGNOSIS — Z5181 Encounter for therapeutic drug level monitoring: Secondary | ICD-10-CM

## 2013-03-24 DIAGNOSIS — Z1212 Encounter for screening for malignant neoplasm of rectum: Secondary | ICD-10-CM

## 2013-03-24 DIAGNOSIS — N3281 Overactive bladder: Secondary | ICD-10-CM

## 2013-03-24 DIAGNOSIS — Z01419 Encounter for gynecological examination (general) (routine) without abnormal findings: Secondary | ICD-10-CM

## 2013-03-24 DIAGNOSIS — Z79890 Hormone replacement therapy (postmenopausal): Secondary | ICD-10-CM

## 2013-03-24 HISTORY — DX: Unspecified urinary incontinence: R32

## 2013-03-24 HISTORY — DX: Encounter for therapeutic drug level monitoring: Z51.81

## 2013-03-24 LAB — HEMOCCULT GUIAC POC 1CARD (OFFICE): Fecal Occult Blood, POC: NEGATIVE

## 2013-03-24 MED ORDER — MIRABEGRON ER 25 MG PO TB24: 25.0000 mg | ORAL_TABLET | Freq: Every day | ORAL | Status: DC

## 2013-03-24 NOTE — Patient Instructions (Signed)
Urinary Incontinence Urinary incontinence is the involuntary loss of urine from your bladder. CAUSES  There are many causes of urinary incontinence. They include: Medicines. Infections. Prostatic enlargement, leading to overflow of urine from your bladder. Surgery. Neurological diseases. Emotional factors. SIGNS AND SYMPTOMS Urinary Incontinence can be divided into four types: Urge incontinence. Urge incontinence is the involuntary loss of urine before you have the opportunity to go to the bathroom. There is a sudden urge to void but not enough time to reach a bathroom. Stress incontinence. Stress incontinence is the sudden loss of urine with any activity that forces urine to pass. It is commonly caused by anatomical changes to the pelvis and sphincter areas of your body. Overflow incontinence. Overflow incontinence is the loss of urine from an obstructed opening to your bladder. This results in a backup of urine and a resultant buildup of pressure within the bladder. When the pressure within the bladder exceeds the closing pressure of the sphincter, the urine overflows, which causes incontinence, similar to water overflowing a dam. Total incontinence. Total incontinence is the loss of urine as a result of the inability to store urine within your bladder. DIAGNOSIS  Evaluating the cause of incontinence may require: A thorough and complete medical and obstetric history. A complete physical exam. Laboratory tests such as a urine culture and sensitivities. When additional tests are indicated, they can include: An ultrasound exam. Kidney and bladder X-rays. Cystoscopy. This is an exam of the bladder using a narrow scope. Urodynamic testing to test the nerve function to the bladder and sphincter areas. TREATMENT  Treatment for urinary incontinence depends on the cause: For urge incontinence caused by a bacterial infection, antibiotics will be prescribed. If the urge incontinence is related to  medicines you take, your health care provider may have you change the medicine. For stress incontinence, surgery to re-establish anatomical support to the bladder or sphincter, or both, will often correct the condition. For overflow incontinence caused by an enlarged prostate, an operation to open the channel through the enlarged prostate will allow the flow of urine out of the bladder. In women with fibroids, a hysterectomy may be recommended. For total incontinence, surgery on your urinary sphincter may help. An artificial urinary sphincter (an inflatable cuff placed around the urethra) may be required. In women who have developed a hole-like passage between their bladder and vagina (vesicovaginal fistula), surgery to close the fistula often is required. HOME CARE INSTRUCTIONS Normal daily hygiene and the use of pads or adult diapers that are changed regularly will help prevent odors and skin damage. Avoid caffeine. It can overstimulate your bladder. Use the bathroom regularly. Try about every 2 3 hours to go to the bathroom, even if you do not feel the need to do so. Take time to empty your bladder completely. After urinating, wait a minute. Then try to urinate again. For causes involving nerve dysfunction, keep a log of the medicines you take and a journal of the times you go to the bathroom. SEEK MEDICAL CARE IF: You experience worsening of pain instead of improvement in pain after your procedure. Your incontinence becomes worse instead of better. SEE IMMEDIATE MEDICAL CARE IF: You experience fever or shaking chills. You are unable to pass your urine. You have redness spreading into your groin or down into your thighs. MAKE SURE YOU:  Understand these instructions.  Will watch your condition. Will get help right away if you are not doing well or get worse. Document Released: 03/23/2004 Document Revised:  12/04/2012 Document Reviewed: 07/23/2012 [employer] Patient Information 2014 [place],  [place]. Overactive Bladder, Adult The bladder has two functions that are totally opposite of the other. One is to relax and stretch out so it can store urine (fills like a balloon), and the other is to contract and squeeze down so that it can empty the urine that it has stored. Proper functioning of the bladder is a complex mixing of these two functions. The filling and emptying of the bladder can be influenced by:  The bladder.  The spinal cord.  The brain.  The nerves going to the bladder.  Other organs that are closely related to the bladder such as prostate in males and the vagina in females. As your bladder fills with urine, nerve signals are sent from the bladder to the brain to tell you that you may need to urinate. Normal urination requires that the bladder squeeze down with sufficient strength to empty the bladder, but this also requires that the bladder squeeze down sufficiently long to finish the job. In addition the sphincter muscles, which normally keep you from leaking urine, must also relax so that the urine can pass. Coordination between the bladder muscle squeezing down and the sphincter muscles relaxing is required to make everything happen normally. With an overactive bladder sometimes the muscles of the bladder contract unexpectedly and involuntarily and this causes an urgent need to urinate. The normal response is to try to hold urine in by contracting the sphincter muscles. Sometimes the bladder contracts so strongly that the sphincter muscles cannot stop the urine from passing out and incontinence occurs. This kind of incontinence is called urge incontinence. Having an overactive bladder can be embarrassing and awkward. It can keep you from living life the way you want to. Many people think it is just something you have to put up with as you grow older or have certain health conditions. In fact, there are treatments that can help make your life easier and more pleasant. CAUSES    Many things can cause an overactive bladder. Possibilities include:  Urinary tract infection or infection of nearby tissues such as the prostate.  Prostate enlargement.  In women, multiple pregnancies or surgery on the uterus or urethra.  Bladder stones, inflammation or tumors.  Caffeine.  Alcohol.  Medications. For example, diuretics (drugs that help the body get rid of extra fluid) increase urine production. Some other medicines must be taken with lots of fluids.  Muscle or nerve weakness. This might be the result of a spinal cord injury, a stroke, multiple sclerosis or Parkinson's disease.  Diabetes can cause a high urine volume which fills the bladder so quickly that the normal urge to urinate is triggered very strongly. SYMPTOMS   Loss of bladder control. You feel the need to urinate and cannot make your body wait.  Sudden, strong urges to urinate.  Urinating 8 or more times a day.  Waking up to urinate two or more times a night. DIAGNOSIS  To decide if you have overactive bladder, your healthcare provider will probably:  Ask about symptoms you have noticed.  Ask about your overall health. This will include questions about any medications you are taking.  Do a physical examination. This will help determine if there are obvious blockages or other problems.  Order some tests. These might include:  A blood test to check for diabetes or other health issues that could be contributing to the problem.  Urine testing. This could measure the flow of urine  and the pressure on the bladder.  A test of your neurological system (the brain, spinal cord and nerves). This is the system that senses the need to urinate. Some of these tests are called flow tests, bladder pressure tests and electrical measurements of the sphincter muscle.  A bladder test to check whether it is emptying completely when you urinate.  Cytoscopy. This test uses a thin tube with a tiny camera on it. It  offers a look inside your urethra and bladder to see if there are problems.  Imaging tests. You might be given a contrast dye and then asked to urinate. X-rays are taken to see how your bladder is working. TREATMENT  An overactive bladder can be treated in many ways. The treatment will depend on the cause. Whether you have a mild or severe case also makes a difference. Often, treatment can be given in your healthcare provider's office or clinic. Be sure to discuss the different options with your caregiver. They include:  Behavioral treatments. These do not involve medication or surgery:  Bladder training. For this, you would follow a schedule to urinate at regular intervals. This helps you learn to control the urge to urinate. At first, you might be asked to wait a few minutes after feeling the urge. In time, you should be able to schedule bathroom visits an hour or more apart.  Kegel exercises. These exercises strengthen the pelvic floor muscles, which support the bladder. By toning these muscles, they can help control urination, even if the bladder muscles are overactive. A specialist will teach you how to do these exercises correctly. They will require daily practice.  Weight loss. If you are obese or overweight, losing weight might stop your bladder from being overactive. Talk to your healthcare provider about how many pounds you should lose. Also ask if there is a specific program or method that would work best for you.  Diet change. This might be suggested if constipation is making your overactive bladder worse. Your healthcare provider or a nutritionist can explain ways to change what you eat to ease constipation. Other people might need to take in less caffeine or alcohol. Sometimes drinking fewer fluids is needed, too.  Protection. This is not an actual treatment. But, you could wear special pads to take care of any leakage while you wait for other treatments to take effect. This will help  you avoid embarrassment.  Physical treatments.  Electrical stimulation. Electrodes will send gentle pulses to the nerves or muscles that help control the bladder. The goal is to strengthen them. Sometimes this is done with the electrodes outside of the body. Or, they might be placed inside the body (implanted). This treatment can take several months to have an effect.  Medications. These are usually used along with other treatments. Several medicines are available. Some are injected into the muscles involved in urination. Others come in pill form. Medications sometimes prescribed include:  Anticholinergics. These drugs block the signals that the nerves deliver to the bladder. This keeps it from releasing urine at the wrong time. Researchers think the drugs might help in other ways, too.  Imipramine. This is an antidepressant. But, it relaxes bladder muscles.  Botox. This is still experimental. Some people believe that injecting it into the bladder muscles will relax them so they work more normally. It has also been injected into the sphincter muscle when the sphincter muscle does not open properly. This is a temporary fix, however. Also, it might make matters worse,  especially in older people.  Surgery.  A device might be implanted to help manage your nerves. It works on the nerves that signal when you need to urinate.  Surgery is sometimes needed with electrical stimulation. If the electrodes are implanted, this is done through surgery.  Sometimes repairs need to be made through surgery. For example, the size of the bladder can be changed. This is usually done in severe cases only. HOME CARE INSTRUCTIONS   Take any medications your healthcare provider prescribed or suggested. Follow the directions carefully.  Practice any lifestyle changes that are recommended. These might include:  Drinking less fluid or drinking at different times of the day. If you need to urinate often during the  night, for example, you may need to stop drinking fluids early in the evening.  Cutting down on caffeine or alcohol. They can both make an overactive bladder worse. Caffeine is found in coffee, tea and sodas.  Doing Kegel exercises to strengthen muscles.  Losing weight, if that is recommended.  Eating a healthy and balanced diet. This will help you avoid constipation.  Keep a journal or a log. You might be asked to record how much you drink and when, and also when you feel the need to urinate.  Learn how to care for implants or other devices, such as pessaries. SEEK MEDICAL CARE IF:   Your overactive bladder gets worse.  You feel increased pain or irritation when you urinate.  You notice blood in your urine.  You have questions about any medications or devices that your healthcare provider recommended.  You notice blood, pus or swelling at the site of any test or treatment procedure.  You have an oral temperature above 102 F (38.9 C). SEEK IMMEDIATE MEDICAL CARE IF:  You have an oral temperature above 102 F (38.9 C), not controlled by medicine. Document Released: 12/10/2008 Document Revised: 05/08/2011 Document Reviewed: 12/10/2008 [facility] Patient Information 2014 [place]. Physical in `1 year Try myrbetriq Mammogram yearly

## 2013-03-24 NOTE — Progress Notes (Signed)
Patient ID: Stacy Herrera, female   DOB: Oct 19, 1945, 68 y.o.   MRN: [id number] History of Present Illness: Stacy Herrera is a 68 year old divorced female in for a physical.   Current Medications, Allergies, Past Medical History, Past Surgical History, Family History and Social History were reviewed in [place] record.     Review of Systems: Patient denies any headaches, blurred vision, shortness of breath, chest pain, abdominal pain, problems with bowel movements. She is not sexually active, and denies any joint swelling or mood swings,she is up at night 4-5 times to pee and has some UI.    Physical Exam:BP 126/80  Pulse 74  Ht 5\' 5"  (1.651 m)  Wt 222 lb (100.699 kg)  BMI 36.94 kg/m2 General:  Well developed, well nourished, no acute distress Skin:  Warm and dry Neck:  Midline trachea, normal thyroid Lungs; Clear to auscultation bilaterally Breast:  No dominant palpable mass, retraction, or nipple discharge,sp breast reduction Cardiovascular: Regular rate and rhythm Abdomen:  Soft, non tender, no hepatosplenomegaly Pelvic:  External genitalia is normal in appearance.  The vagina is normal in appearance for age.               The cervix and uterus are absent.  No adnexal masses or tenderness noted. Rectal: Good sphincter tone, no polyps, or hemorrhoids felt.  Hemoccult negative. Extremities:  No swelling noted Psych:  No mood changes, alert and cooperative, seems happy, still working   Impression: Yearly gyn no pap OAB  UI Estrogen therapy    Plan: Physical in 1 year Mammogram yearly Labs at PCP Trial myrbetriq Number of samples28  Lot number [id number]    Exp date 10/16 Review handouts on UI and OAB Continue estrace

## 2013-04-17 IMAGING — MG MM DIGITAL SCREENING BILAT W/ CAD
4 series · 4 of 4 positions shown · non-contrast
Comparison: none

DG SCREEN MAMMOGRAM BILATERAL
Bilateral CC and MLO view(s) were taken.
Technologist: [REDACTED]

DIGITAL SCREENING MAMMOGRAM WITH CAD:
There are scattered fibroglandular densities.  No masses or malignant type calcifications are 
identified.  Compared with prior studies.
Images were processed with CAD.

[L CC]
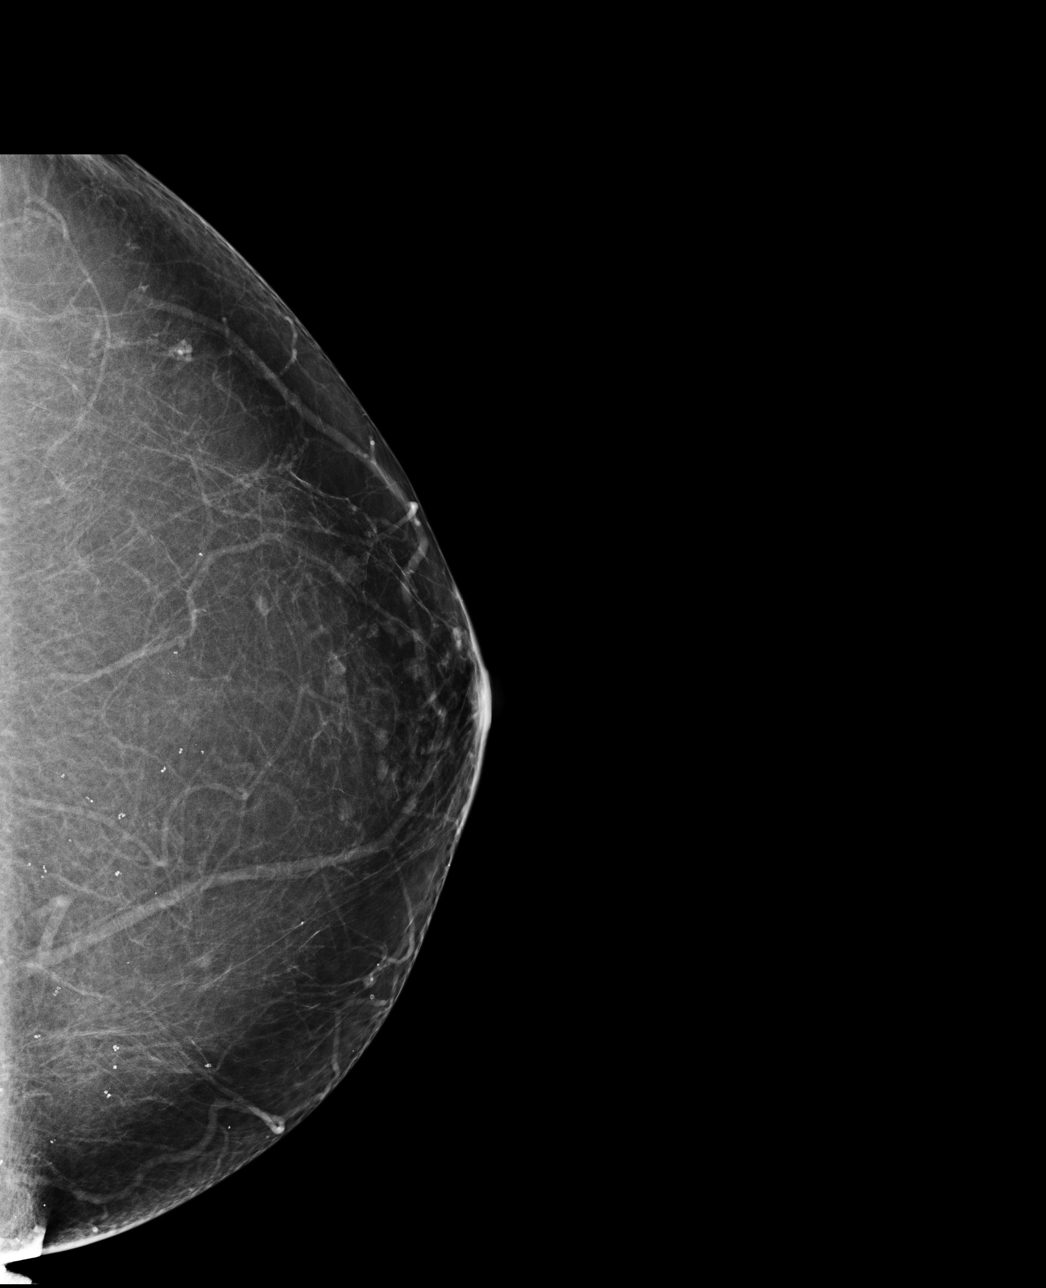

[L MLO]
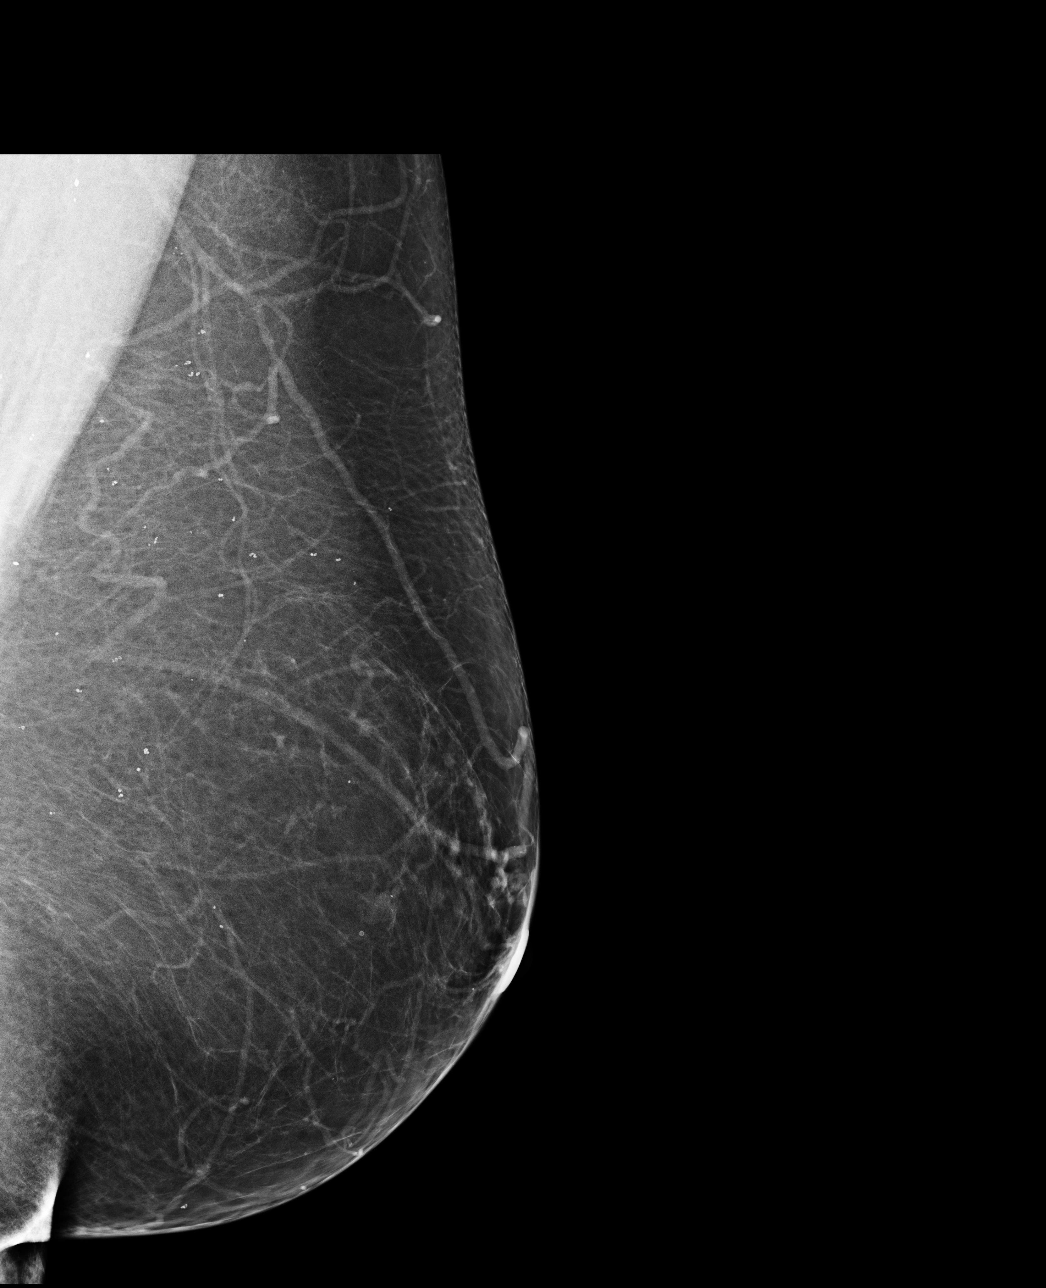

[R CC]
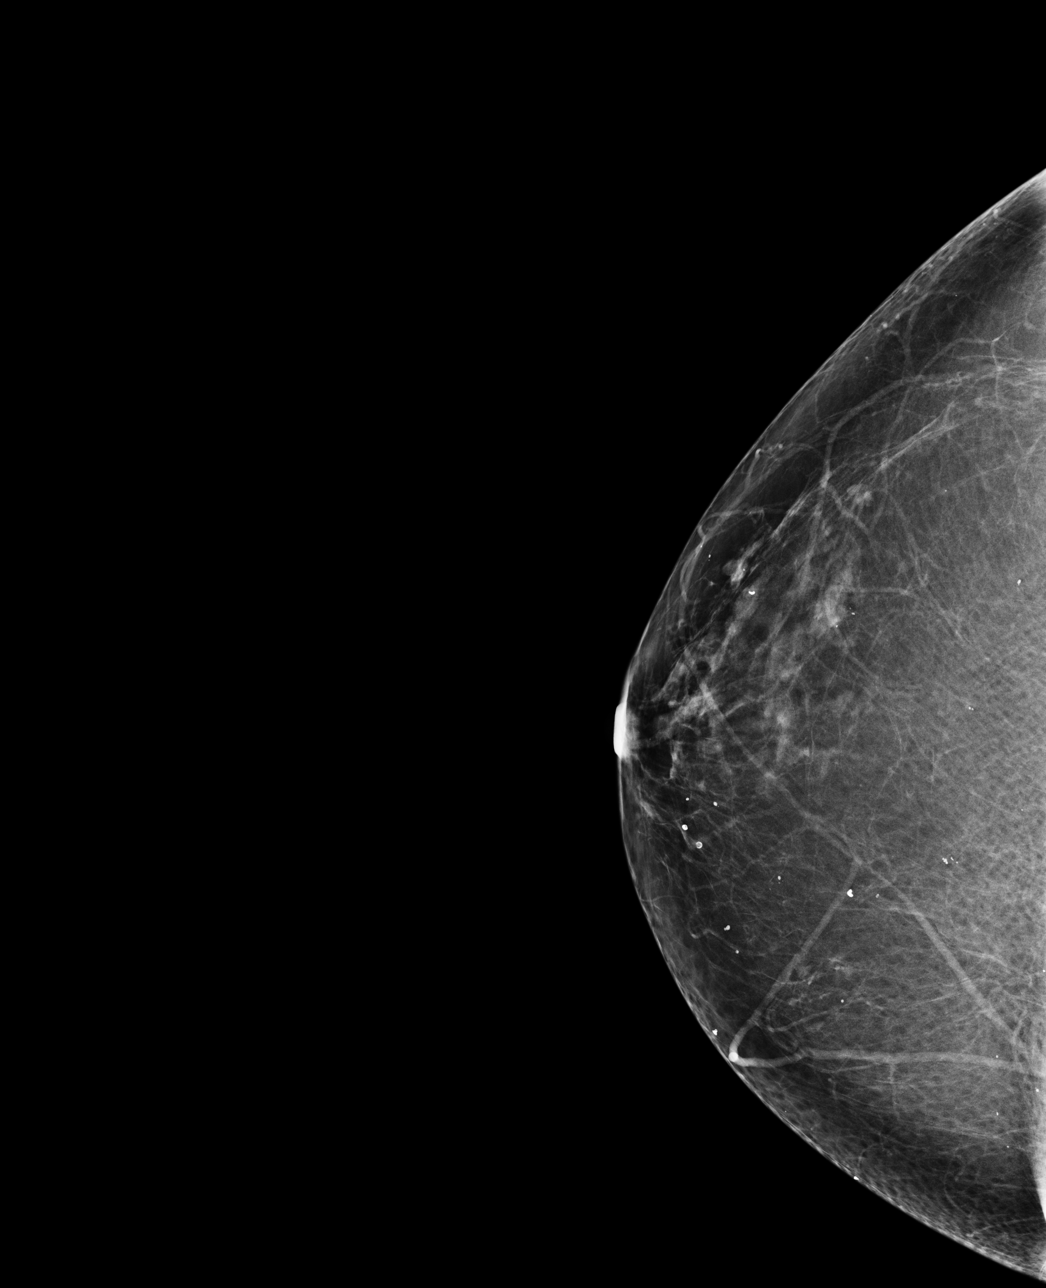

[R MLO]
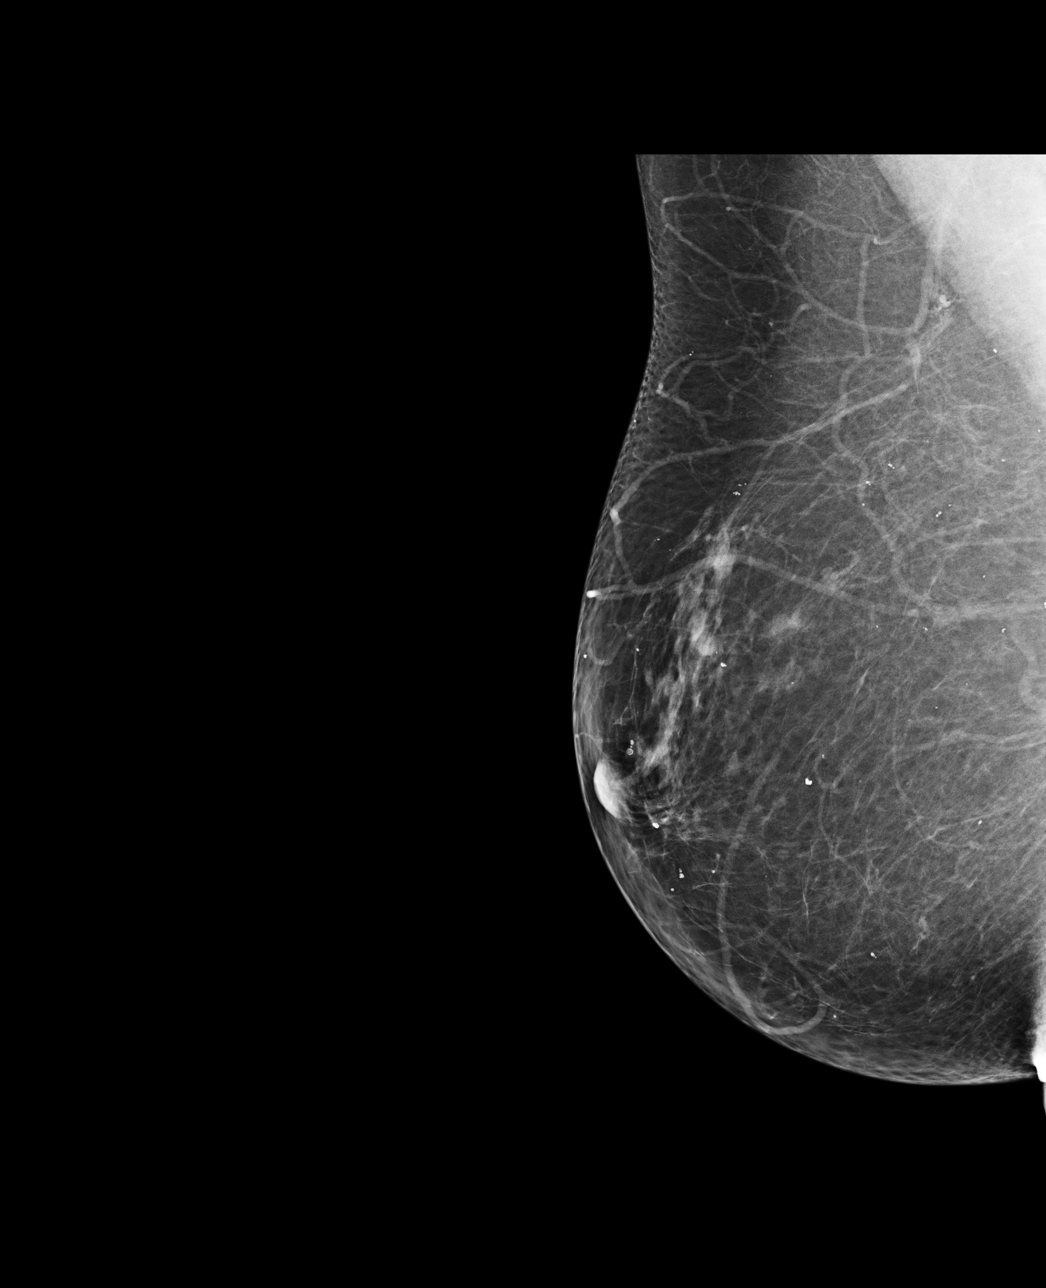

[4 of 4 positions shown; findings below may reference images not displayed]

IMPRESSION: No specific mammographic evidence of malignancy.  Next screening mammogram is recommended in one 
year.

A result letter of this screening mammogram will be mailed directly to the patient.

ASSESSMENT: Negative - BI-RADS 1

Screening mammogram in 1 year.
,

## 2013-04-29 ENCOUNTER — Ambulatory Visit (HOSPITAL_COMMUNITY)
Admission: RE | Admit: 2013-04-29 | Discharge: 2013-04-29 | Disposition: A | Discharge status: Home / Self Care | Payer: PRIVATE HEALTH INSURANCE | Source: Ambulatory Visit | Attending: Physician Assistant | Admitting: Physician Assistant

## 2013-04-29 ENCOUNTER — Other Ambulatory Visit (HOSPITAL_COMMUNITY): Payer: Self-pay | Admitting: Physician Assistant

## 2013-04-29 DIAGNOSIS — R1011 Right upper quadrant pain: Secondary | ICD-10-CM | POA: Insufficient documentation

## 2013-04-29 LAB — POCT I-STAT CREATININE: CREATININE: 0.8 mg/dL (ref 0.50–1.10)

## 2013-04-29 MED ORDER — IOHEXOL 300 MG/ML  SOLN
100.0000 mL | Freq: Once | INTRAMUSCULAR | Status: AC | PRN
  Administered 2013-04-29: 100 mL via INTRAVENOUS

## 2013-06-04 ENCOUNTER — Other Ambulatory Visit: Payer: Self-pay | Admitting: Obstetrics & Gynecology

## 2013-07-25 ENCOUNTER — Ambulatory Visit (INDEPENDENT_AMBULATORY_CARE_PROVIDER_SITE_OTHER): Payer: PRIVATE HEALTH INSURANCE | Admitting: Adult Health

## 2013-07-25 ENCOUNTER — Encounter: Payer: Self-pay | Admitting: Adult Health

## 2013-07-25 VITALS — BP 120/50 | Ht 66.0 in | Wt 226.5 lb

## 2013-07-25 DIAGNOSIS — N390 Urinary tract infection, site not specified: Secondary | ICD-10-CM | POA: Insufficient documentation

## 2013-07-25 HISTORY — DX: Urinary tract infection, site not specified: N39.0

## 2013-07-25 LAB — POCT URINALYSIS DIPSTICK: NITRITE UA: POSITIVE

## 2013-07-25 MED ORDER — SULFAMETHOXAZOLE-TMP DS 800-160 MG PO TABS: 1.0000 | ORAL_TABLET | Freq: Two times a day (BID) | ORAL | Status: DC

## 2013-07-25 NOTE — Patient Instructions (Signed)
Urinary Tract Infection Urinary tract infections (UTIs) can develop anywhere along your urinary tract. Your urinary tract is your body's drainage system for removing wastes and extra water. Your urinary tract includes two kidneys, two ureters, a bladder, and a urethra. Your kidneys are a pair of bean-shaped organs. Each kidney is about the size of your fist. They are located below your ribs, one on each side of your spine. CAUSES Infections are caused by microbes, which are microscopic organisms, including fungi, viruses, and bacteria. These organisms are so small that they can only be seen through a microscope. Bacteria are the microbes that most commonly cause UTIs. SYMPTOMS  Symptoms of UTIs may vary by age and gender of the patient and by the location of the infection. Symptoms in young women typically include a frequent and intense urge to urinate and a painful, burning feeling in the bladder or urethra during urination. Older women and men are more likely to be tired, shaky, and weak and have muscle aches and abdominal pain. A fever may mean the infection is in your kidneys. Other symptoms of a kidney infection include pain in your back or sides below the ribs, nausea, and vomiting. DIAGNOSIS To diagnose a UTI, your caregiver will ask you about your symptoms. Your caregiver also will ask to provide a urine sample. The urine sample will be tested for bacteria and white blood cells. White blood cells are made by your body to help fight infection. TREATMENT  Typically, UTIs can be treated with medication. Because most UTIs are caused by a bacterial infection, they usually can be treated with the use of antibiotics. The choice of antibiotic and length of treatment depend on your symptoms and the type of bacteria causing your infection. HOME CARE INSTRUCTIONS  If you were prescribed antibiotics, take them exactly as your caregiver instructs you. Finish the medication even if you feel better after you  have only taken some of the medication.  Drink enough water and fluids to keep your urine clear or pale yellow.  Avoid caffeine, tea, and carbonated beverages. They tend to irritate your bladder.  Empty your bladder often. Avoid holding urine for long periods of time.  Empty your bladder before and after sexual intercourse.  After a bowel movement, women should cleanse from front to back. Use each tissue only once. SEEK MEDICAL CARE IF:   You have back pain.  You develop a fever.  Your symptoms do not begin to resolve within 3 days. SEEK IMMEDIATE MEDICAL CARE IF:   You have severe back pain or lower abdominal pain.  You develop chills.  You have nausea or vomiting.  You have continued burning or discomfort with urination. MAKE SURE YOU:   Understand these instructions.  Will watch your condition.  Will get help right away if you are not doing well or get worse. Document Released: 11/23/2004 Document Revised: 08/15/2011 Document Reviewed: 03/24/2011 [facility] Patient Information 2014 [place]. Take septra ds Push fluids Follow up prn

## 2013-07-25 NOTE — Progress Notes (Signed)
Subjective:     Patient ID: Stacy Herrera, female   DOB: 05-03-1945, 68 y.o.   MRN: [id number]  HPI Aunesti is a  68 year old white female in complaining of burning with urination and frequency and some pain.She says it is a UTI.  Review of Systems See HPI Reviewed past medical,surgical, social and family history. Reviewed medications and allergies.     Objective:   Physical Exam BP 120/50  Ht 5\' 6"  (1.676 m)  Wt 226 lb 8 oz (102.74 kg)  BMI 36.58 kg/m2   urine 2+leuks,trace blood and + nitrates,  No fever or CVAT.  Assessment:     UTI    Plan:    UA C&S sent Rx septra ds 1 bid x 7 days Push fluids Follow up prn  Review handout on UTI

## 2013-07-26 LAB — URINALYSIS
Bilirubin Urine: NEGATIVE
GLUCOSE, UA: NEGATIVE mg/dL
HGB URINE DIPSTICK: NEGATIVE
KETONES UR: NEGATIVE mg/dL
Nitrite: POSITIVE — AB
PH: 6 (ref 5.0–8.0)
Protein, ur: NEGATIVE mg/dL
Specific Gravity, Urine: 1.015 (ref 1.005–1.030)
Urobilinogen, UA: 0.2 mg/dL (ref 0.0–1.0)

## 2013-07-28 LAB — URINE CULTURE: Colony Count: >=100000

## 2013-09-04 IMAGING — CR DG LUMBAR SPINE COMPLETE 4+V
5 series · 5 of 5 positions shown · non-contrast
Comparison: CT of the abdomen 10/18/2005

CLINICAL DATA: Diffuse bodyaches.  Fall.

LUMBAR SPINE - COMPLETE 4+ VIEW

[view not recorded (1 of 5)]
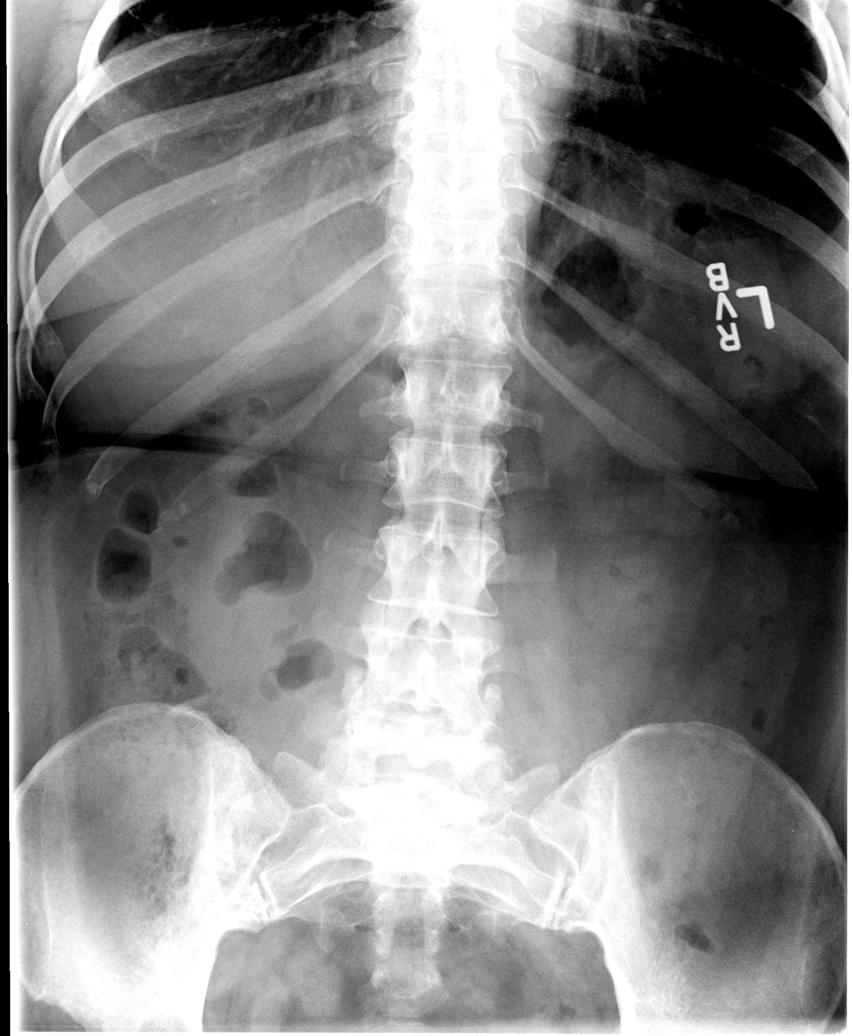

[view not recorded (2 of 5)]
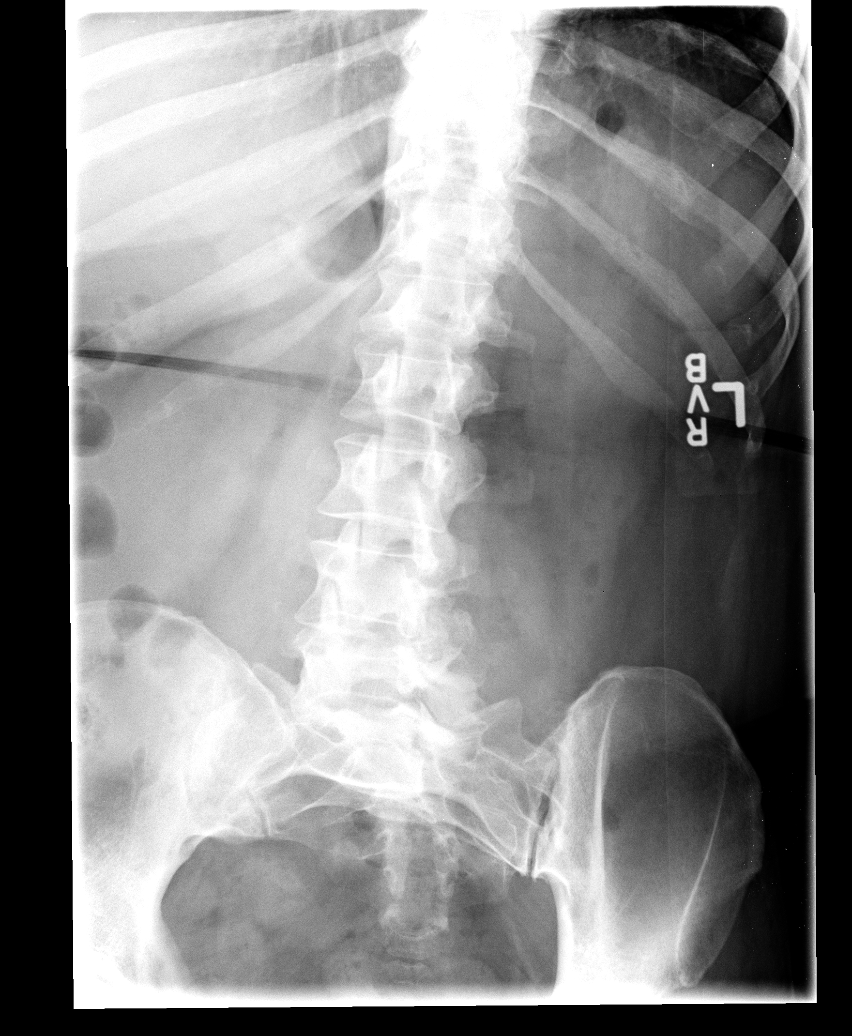

[view not recorded (3 of 5)]
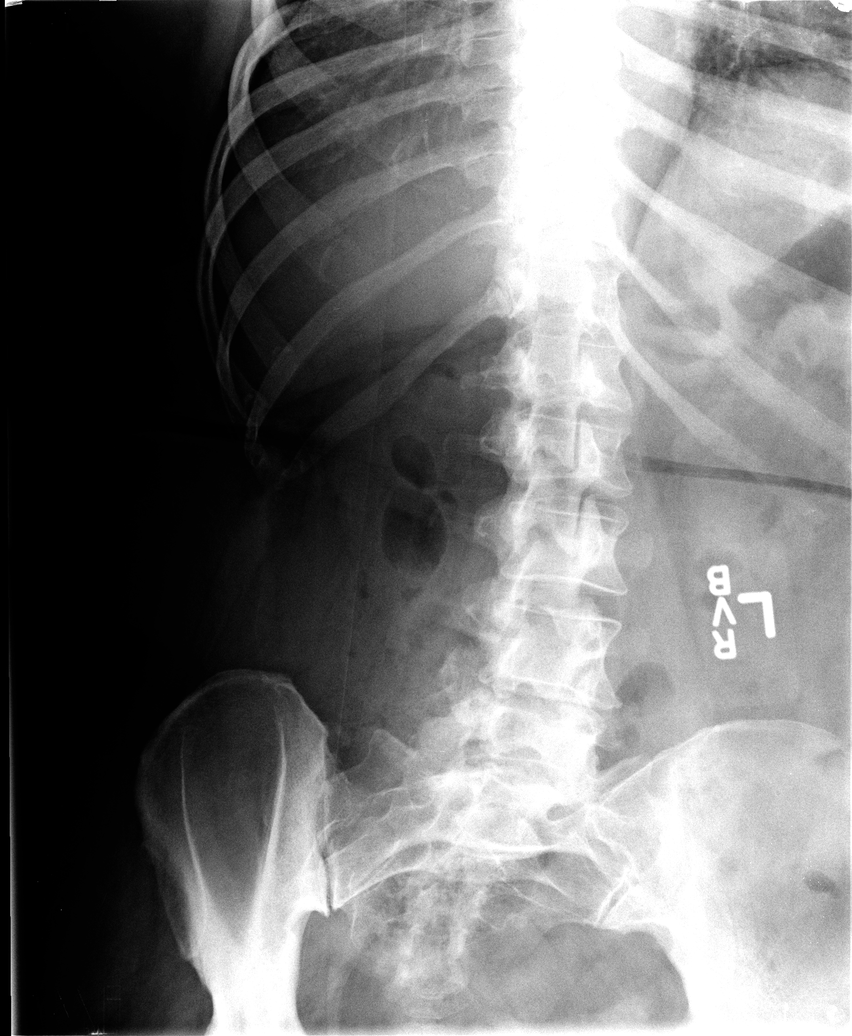

[view not recorded (4 of 5)]
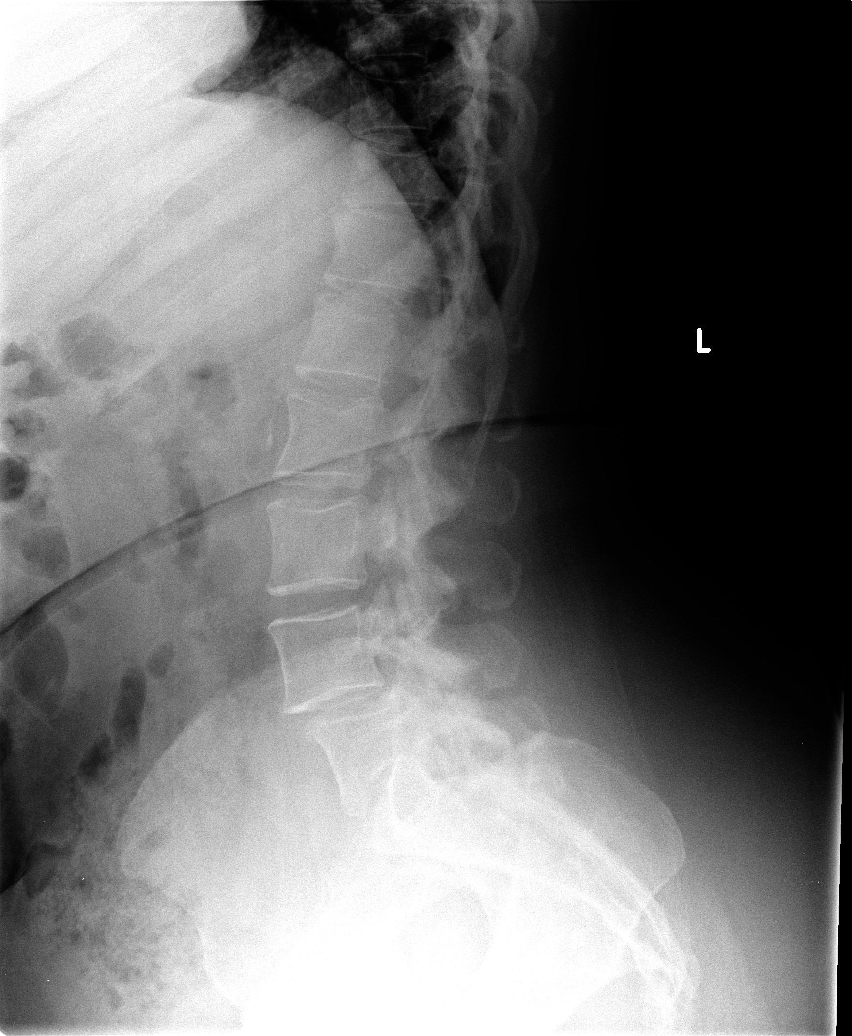

[view not recorded (5 of 5)]
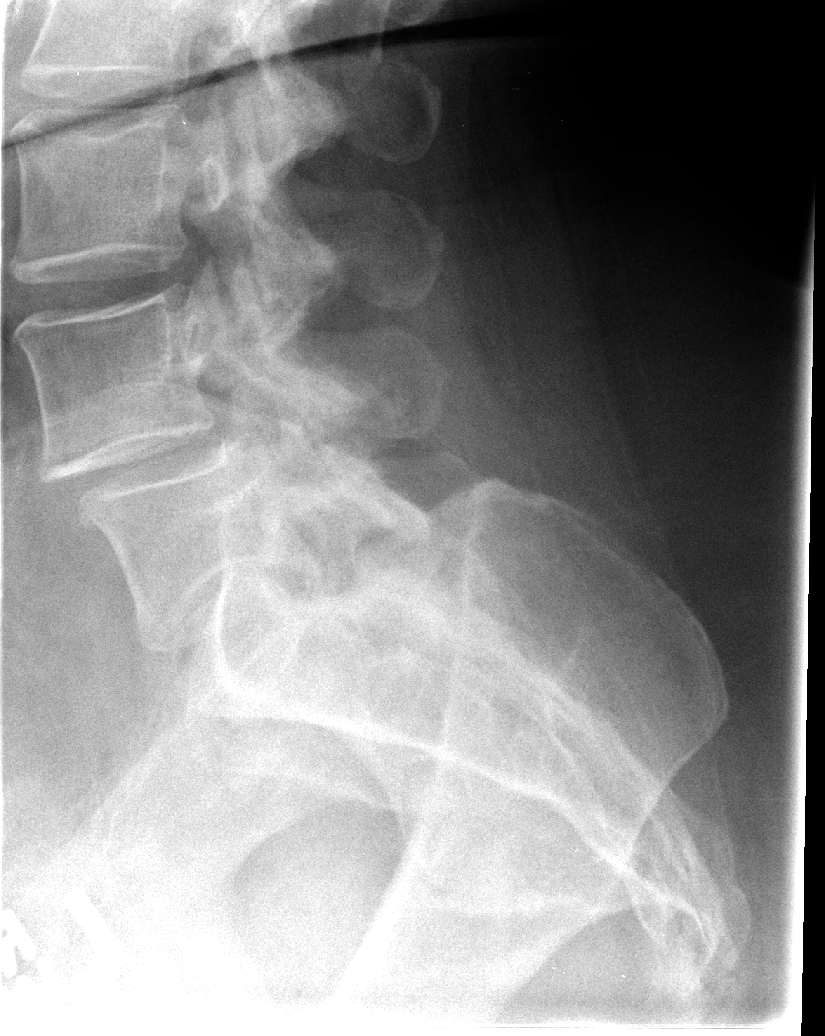

[5 of 5 positions shown; findings below may reference images not displayed]

FINDINGS: There is grade 1 anterolisthesis of L4 on all there is
grade 1 anterolisthesis of L4 on L5. Suspect L5 pars defects.  No
evidence for acute fracture.  No lytic or blastic lesion
identified.  Regional bowel gas pattern is nonobstructive.  There
is superior endplate fracture of L2, likely related to
osteoporosis.
IMPRESSION: 1.
1.  Anterolisthesis L4-L5, grade 1.
2.  Suspect L5 pars defects.
3.  Osteoporotic type endplate fracture of L2.

## 2013-09-04 IMAGING — CR DG CERVICAL SPINE COMPLETE 4+V
7 series · 7 of 7 positions shown · non-contrast
Comparison: None.

CLINICAL DATA: Fall.  Posterior neck pain.

CERVICAL SPINE - COMPLETE 4+ VIEW

[view not recorded (1 of 7)]
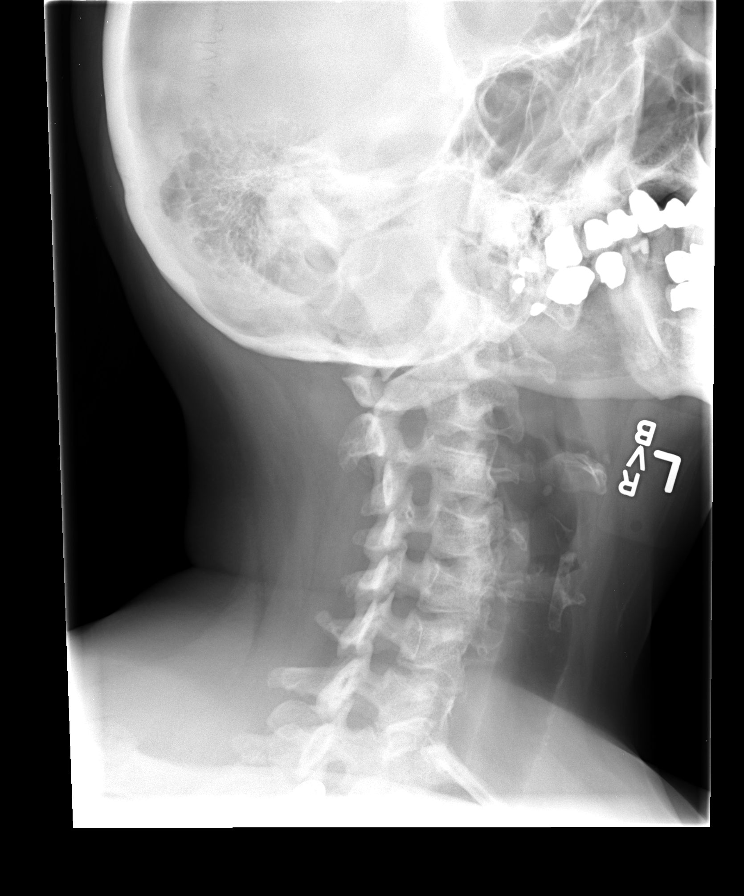

[view not recorded (2 of 7)]
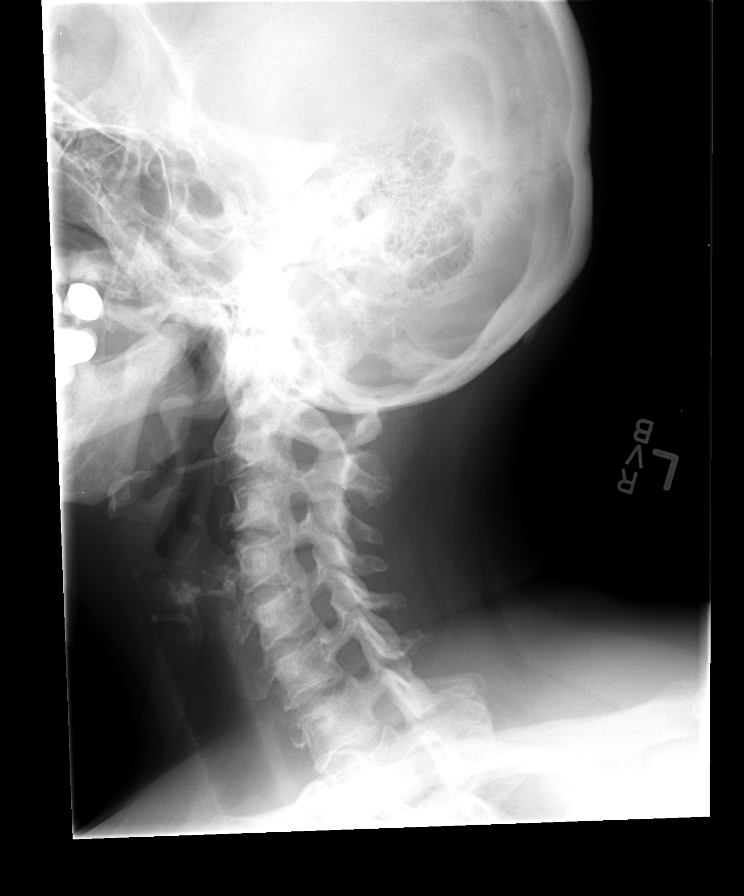

[view not recorded (3 of 7)]
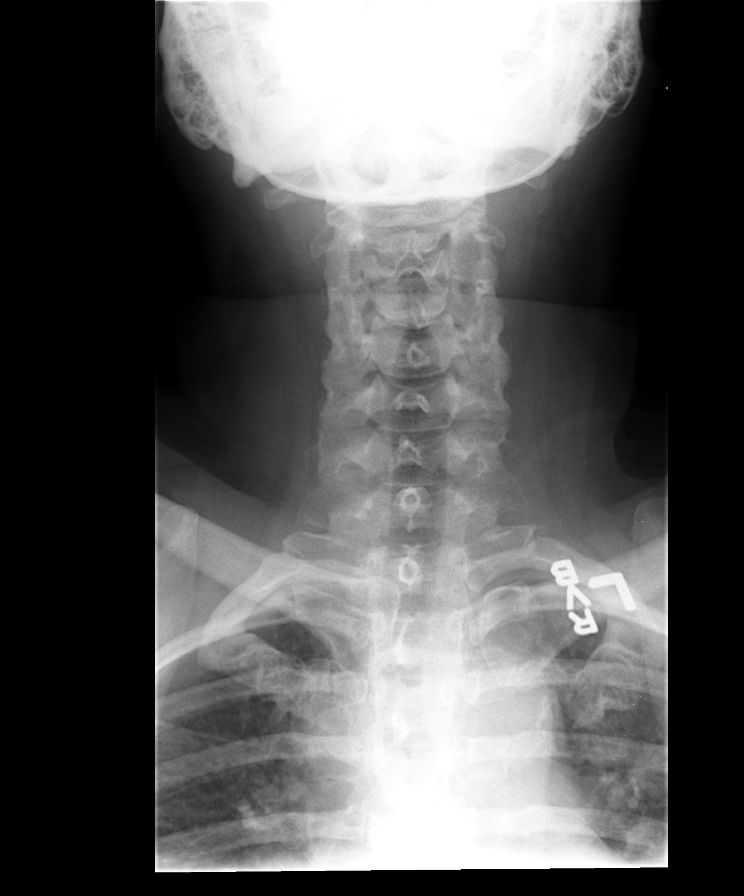

[view not recorded (4 of 7)]
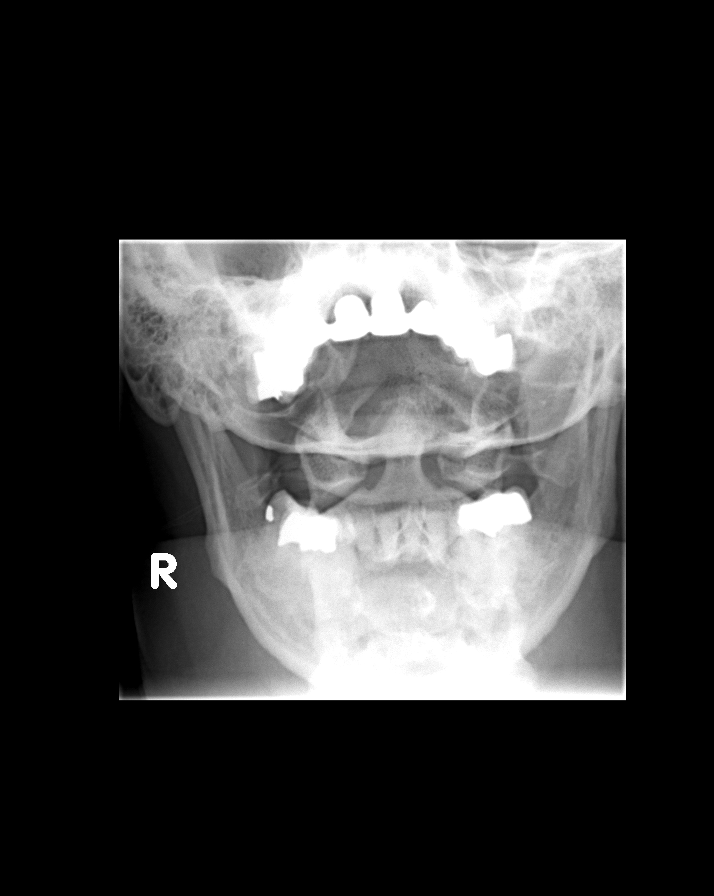

[view not recorded (5 of 7)]
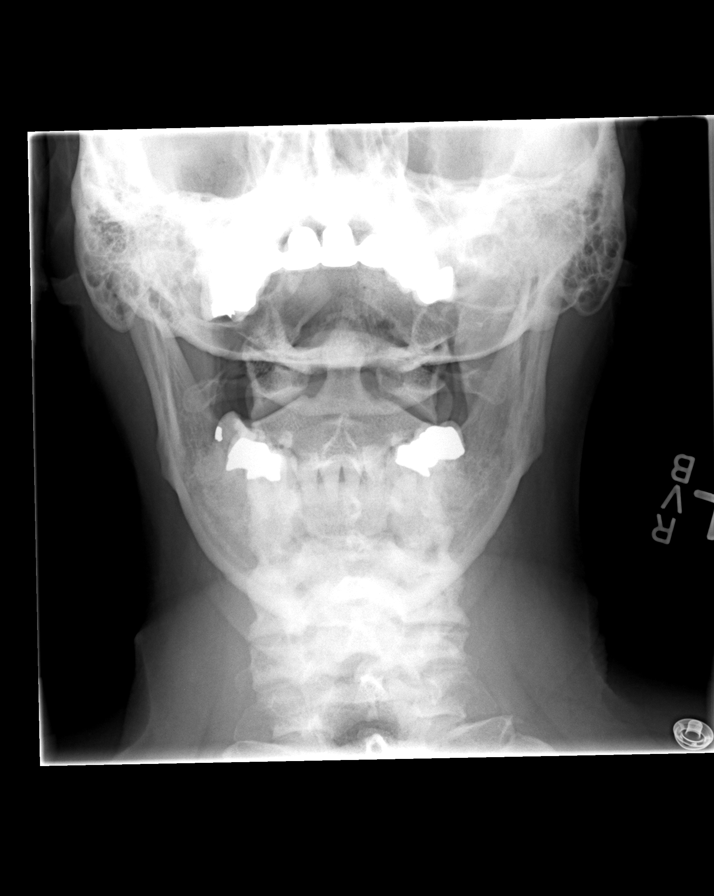

[view not recorded (6 of 7)]
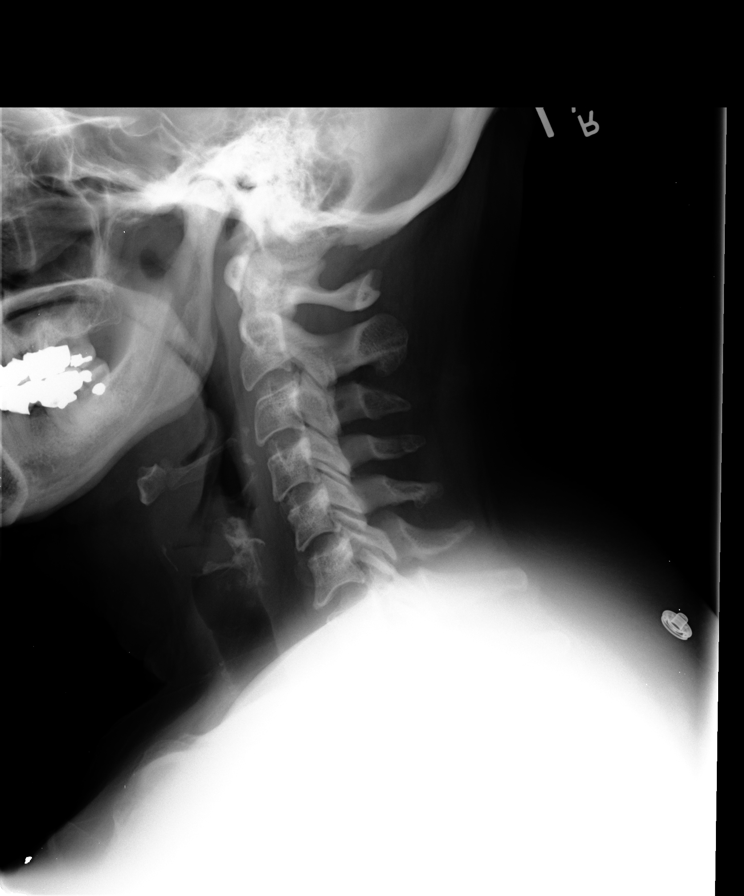

[view not recorded (7 of 7)]
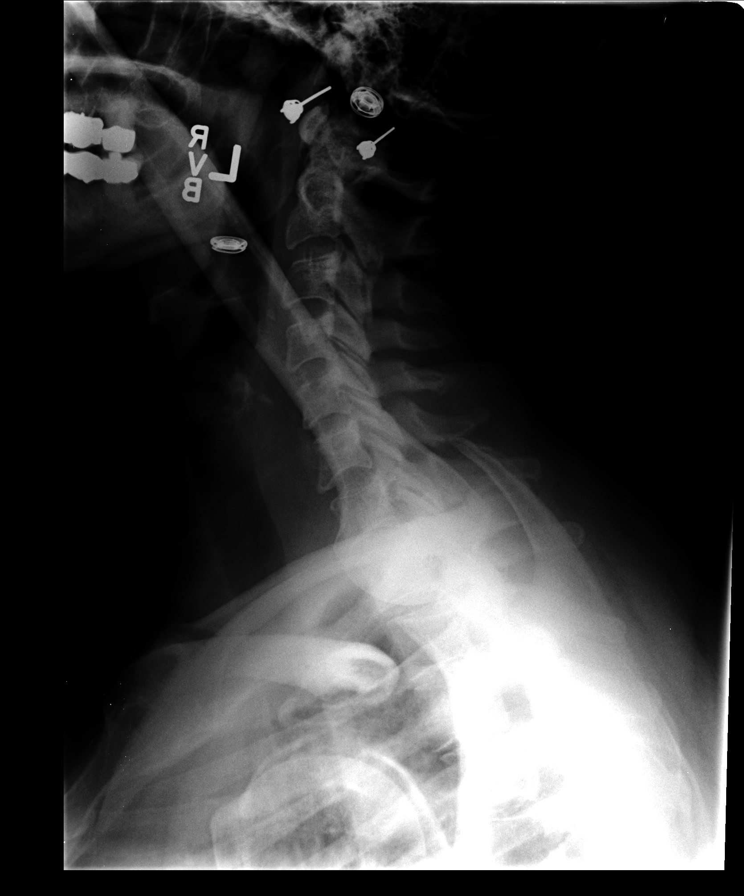

[7 of 7 positions shown; findings below may reference images not displayed]

FINDINGS: There is normal alignment of the cervical spine.  There
is no evidence for acute fracture or dislocation.  Prevertebral
soft tissues have a normal appearance.  Lung apices have a normal
appearance.
IMPRESSION: Negative exam.

## 2013-09-04 IMAGING — CR DG KNEE COMPLETE 4+V*L*
4 series · 4 of 4 positions shown · non-contrast
Comparison: None.

CLINICAL DATA: Diffuse bodyaches.  Fell.  History of prior surgery.

LEFT KNEE - COMPLETE 4+ VIEW

[view not recorded (1 of 4)]
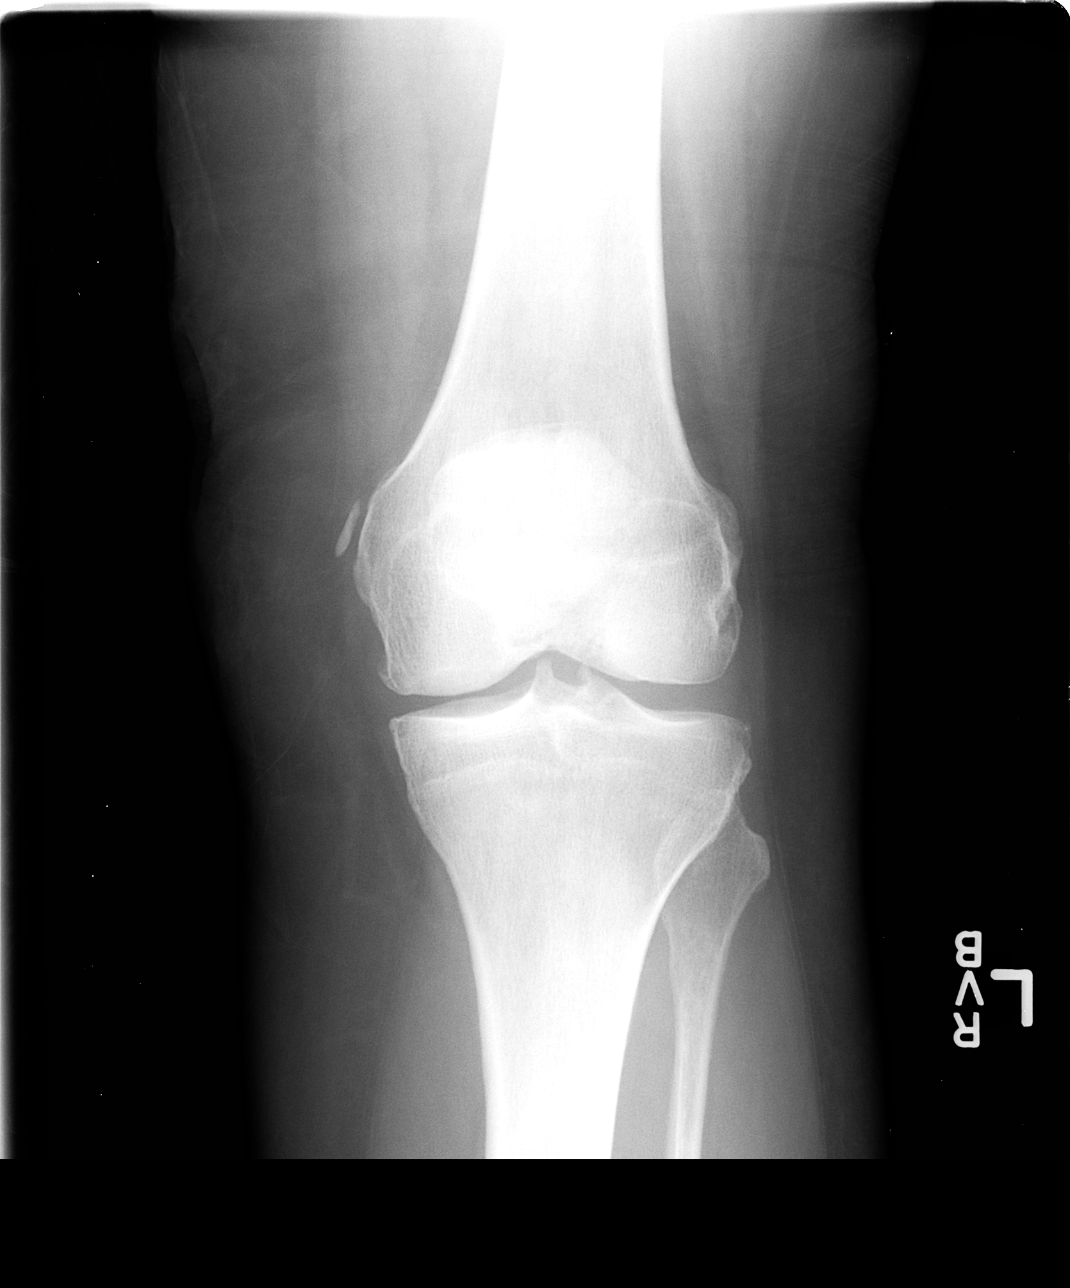

[view not recorded (2 of 4)]
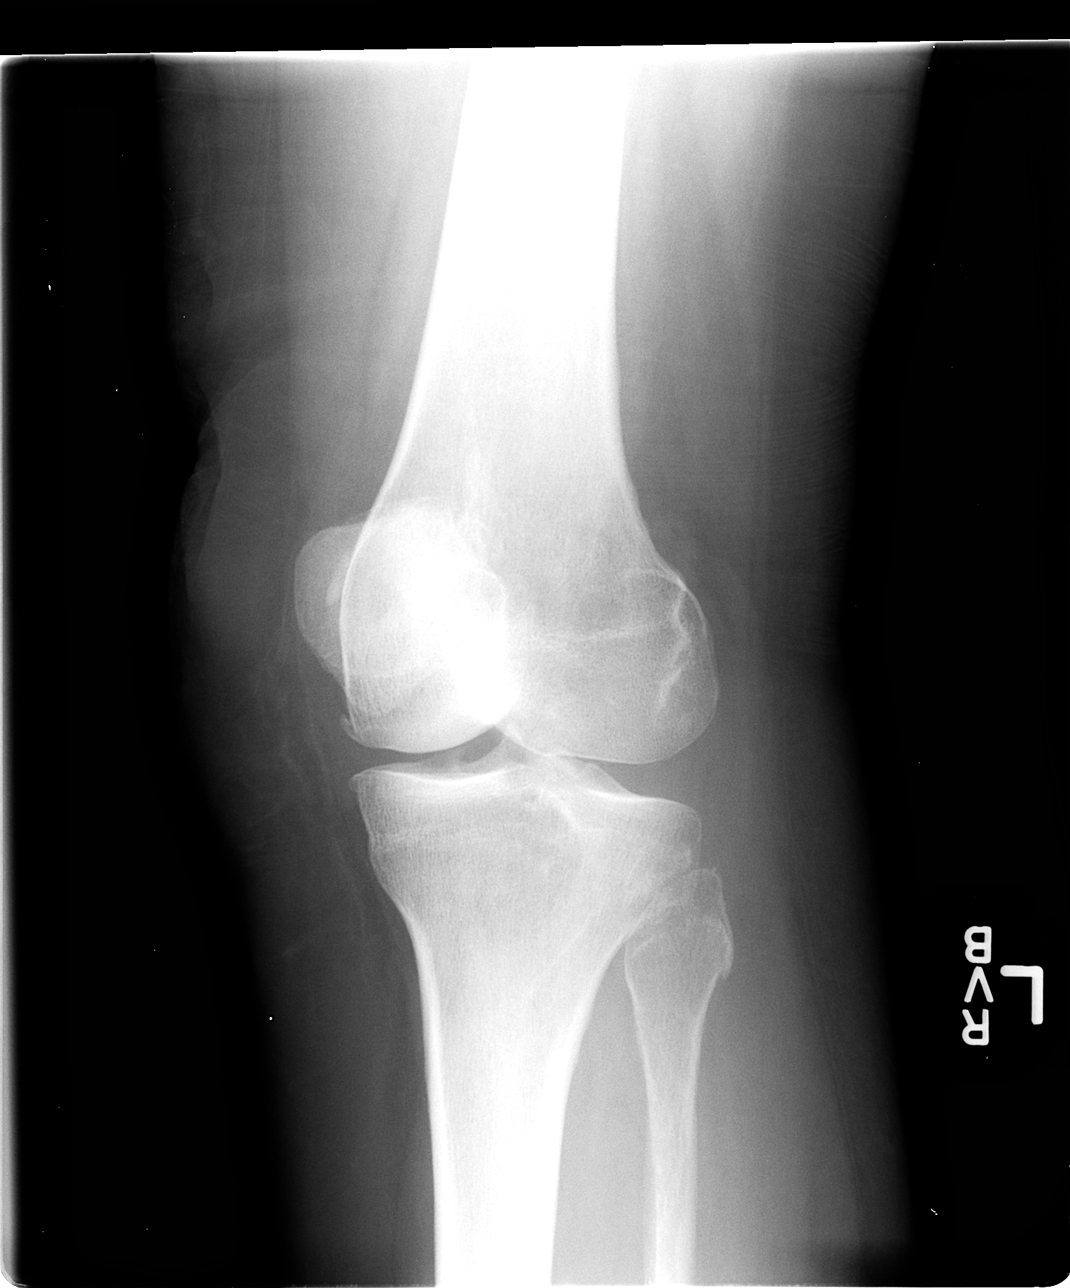

[view not recorded (3 of 4)]
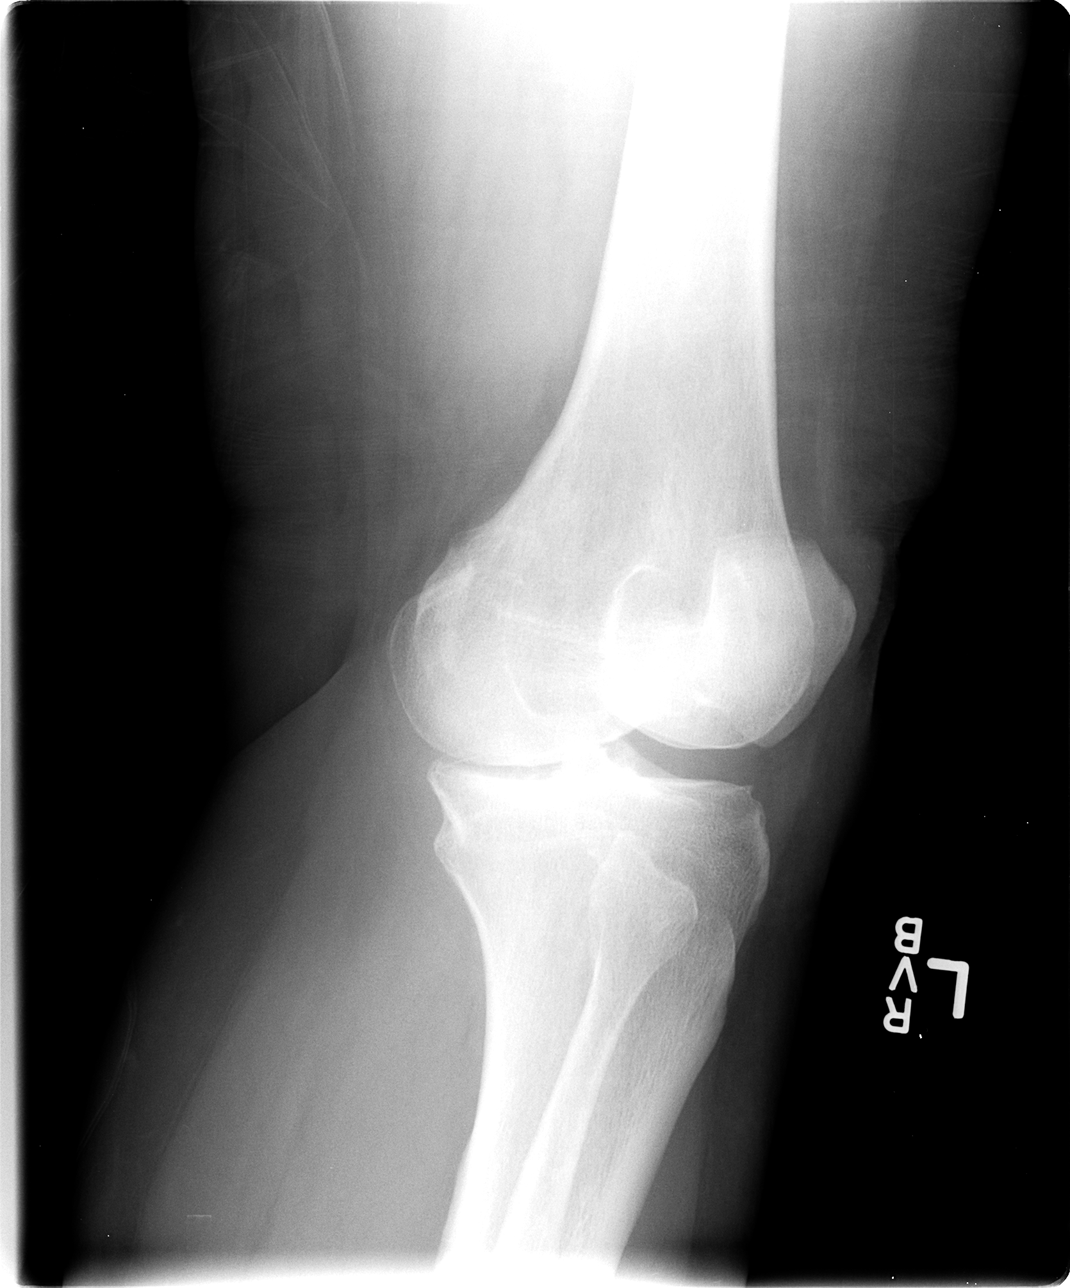

[view not recorded (4 of 4)]
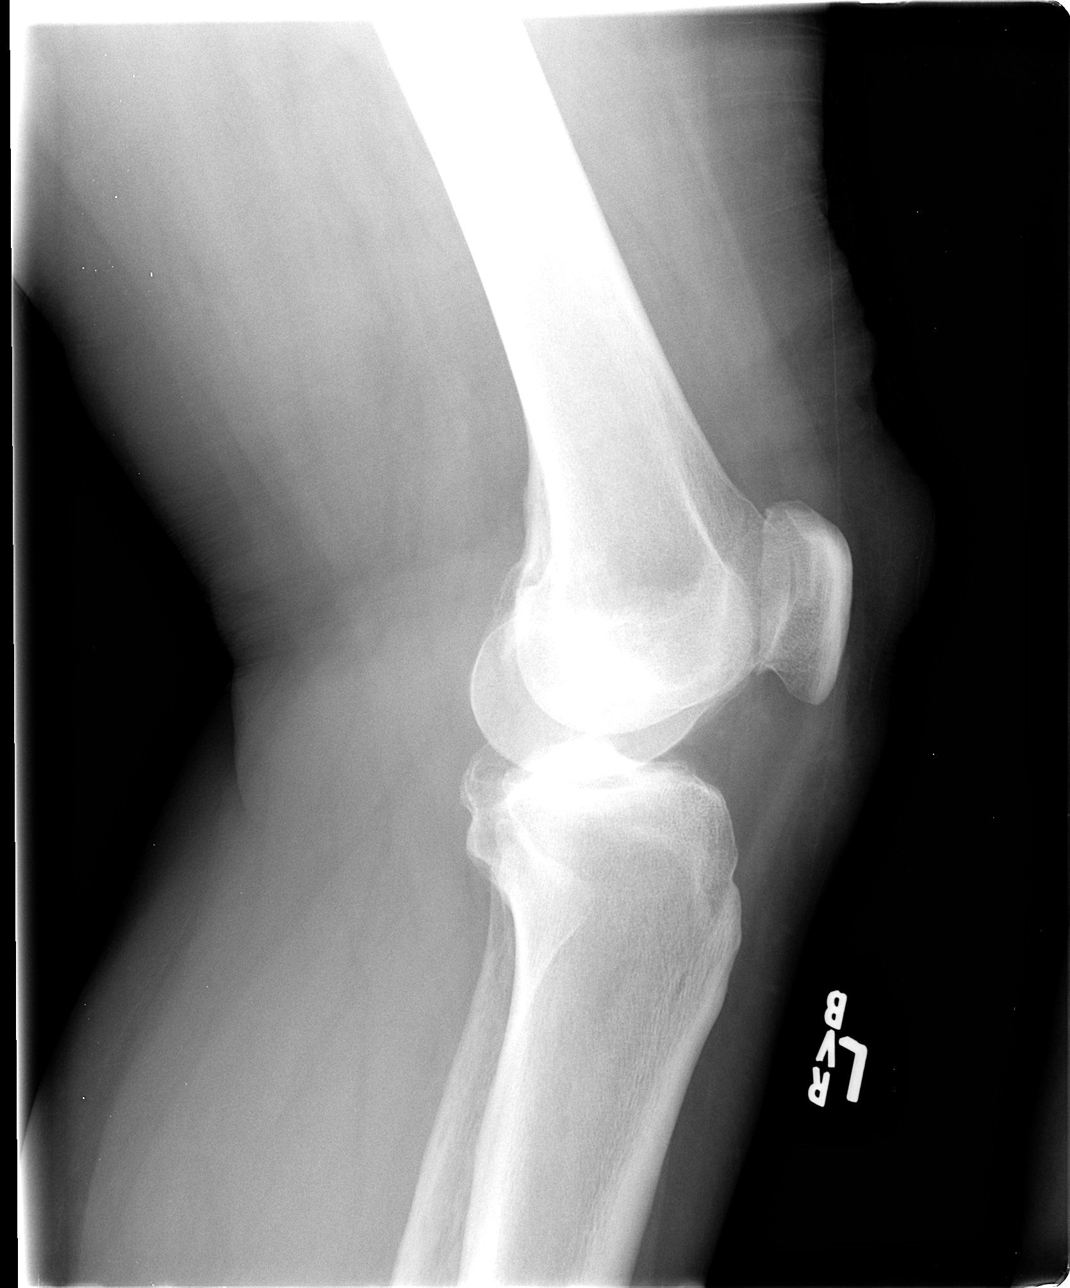

[4 of 4 positions shown; findings below may reference images not displayed]

FINDINGS: There are mild degenerative changes of the knee.  Changes
primarily involve the patellofemoral and medial compartments.  No
evidence for acute fracture or subluxation.  There may be small
joint effusion.  There is soft tissue edema along the anterior
aspect of the knee.
IMPRESSION: 1.  Degenerative changes.
2.  Suspect small effusion.
3.  Anterior soft tissue swelling.

## 2013-09-04 IMAGING — CR DG HIP (WITH OR WITHOUT PELVIS) 2-3V*L*
3 series · 3 of 3 positions shown · non-contrast
Comparison: None.

CLINICAL DATA: Fall.  Diffuse bodyaches.

LEFT HIP - COMPLETE 2+ VIEW

[view not recorded (1 of 3)]
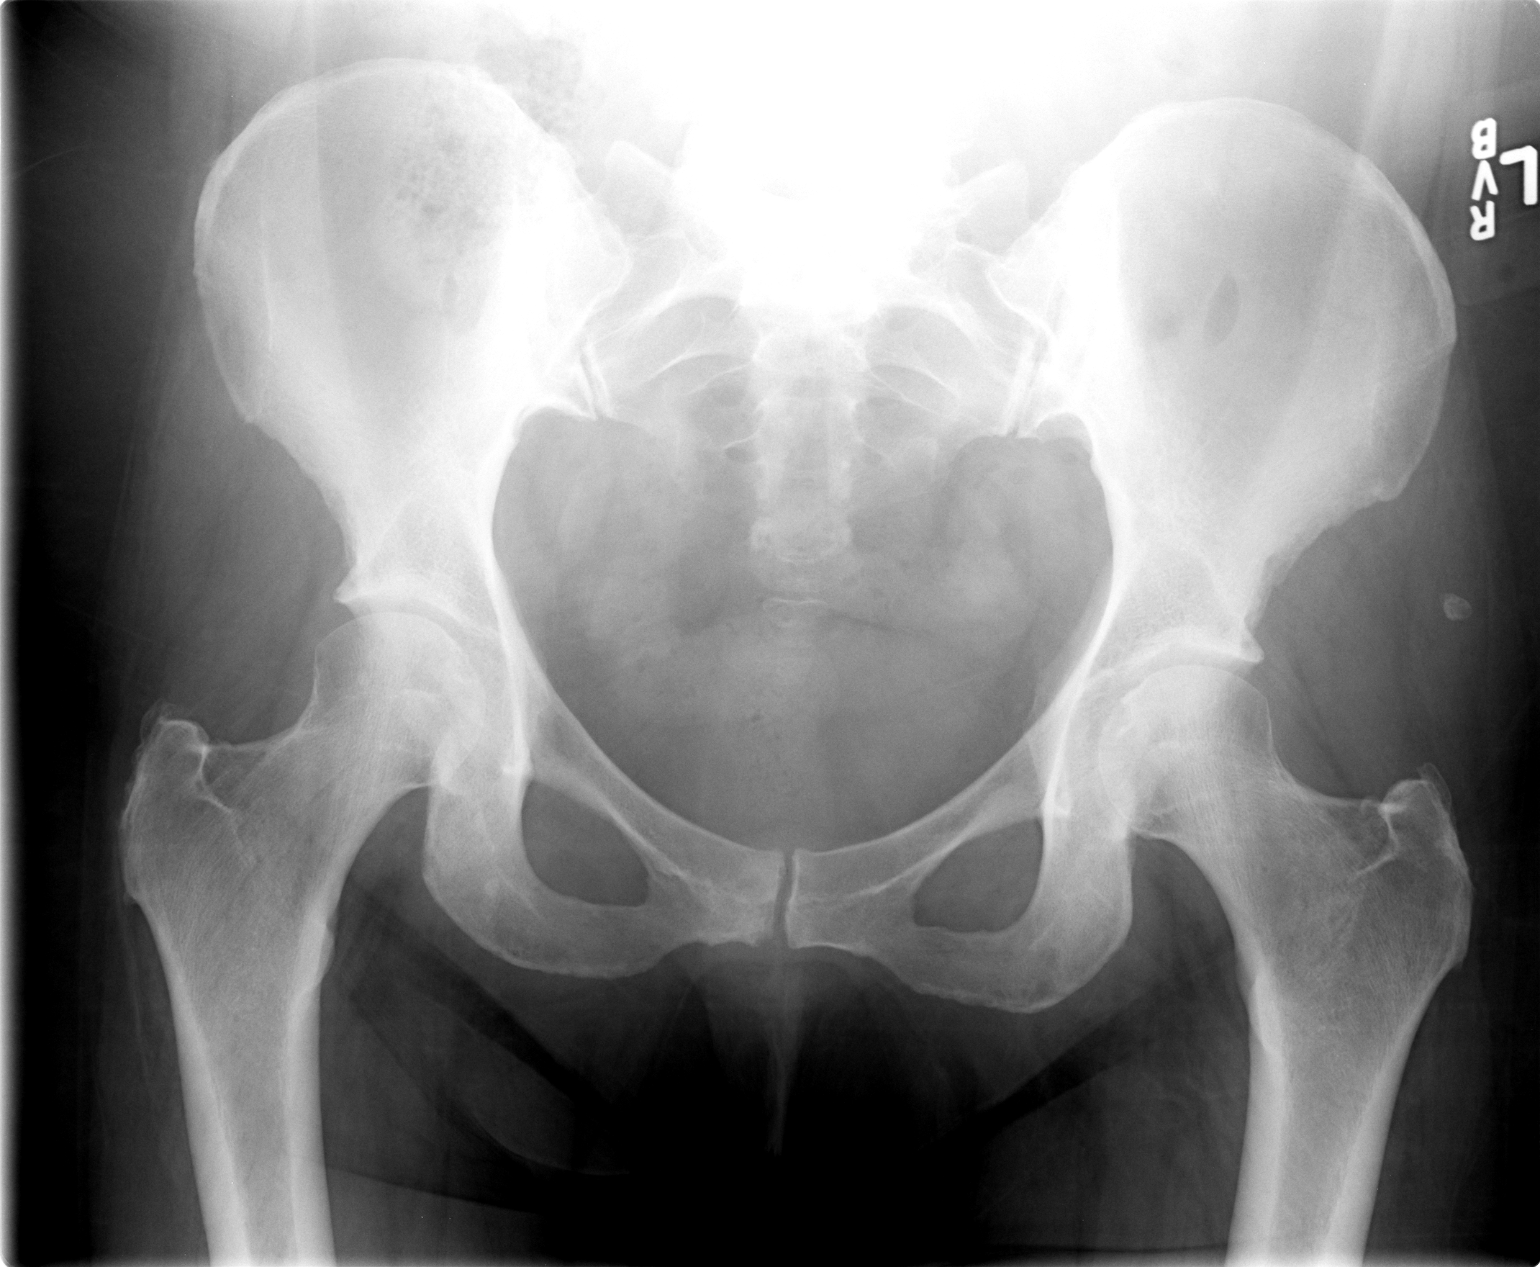

[view not recorded (2 of 3)]
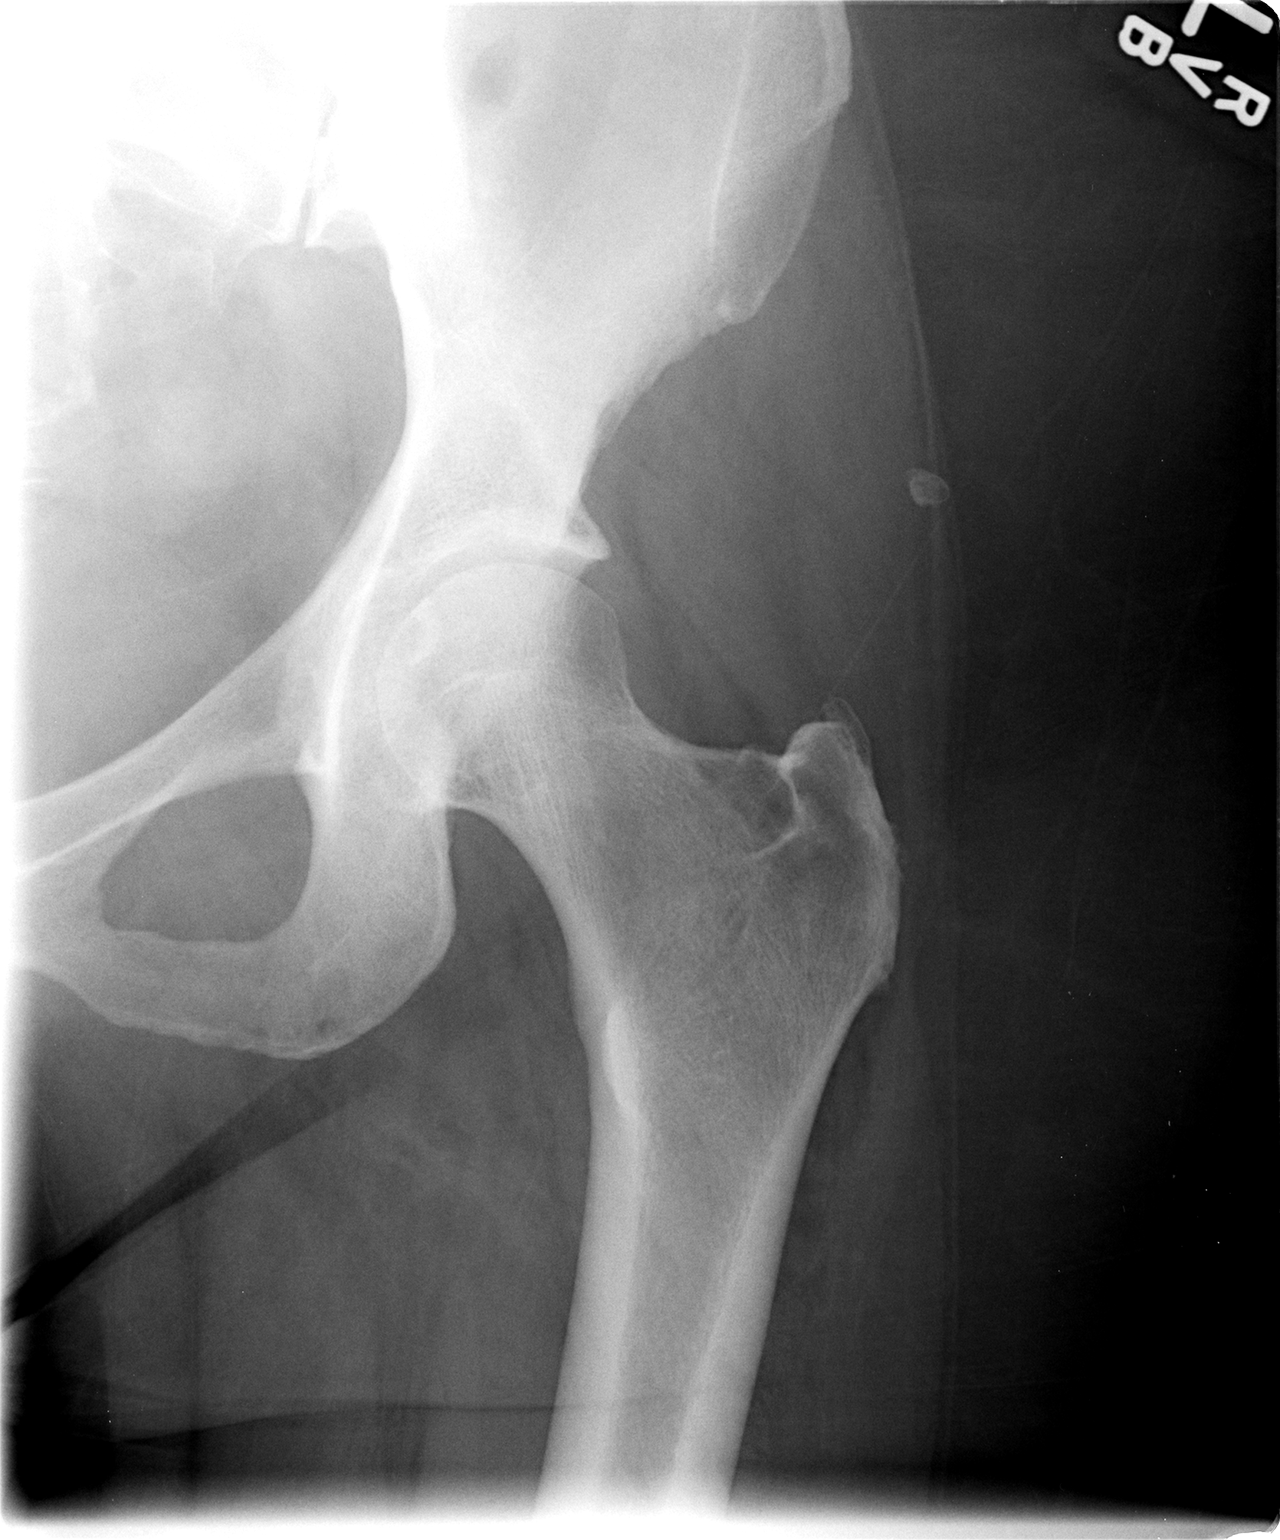

[view not recorded (3 of 3)]
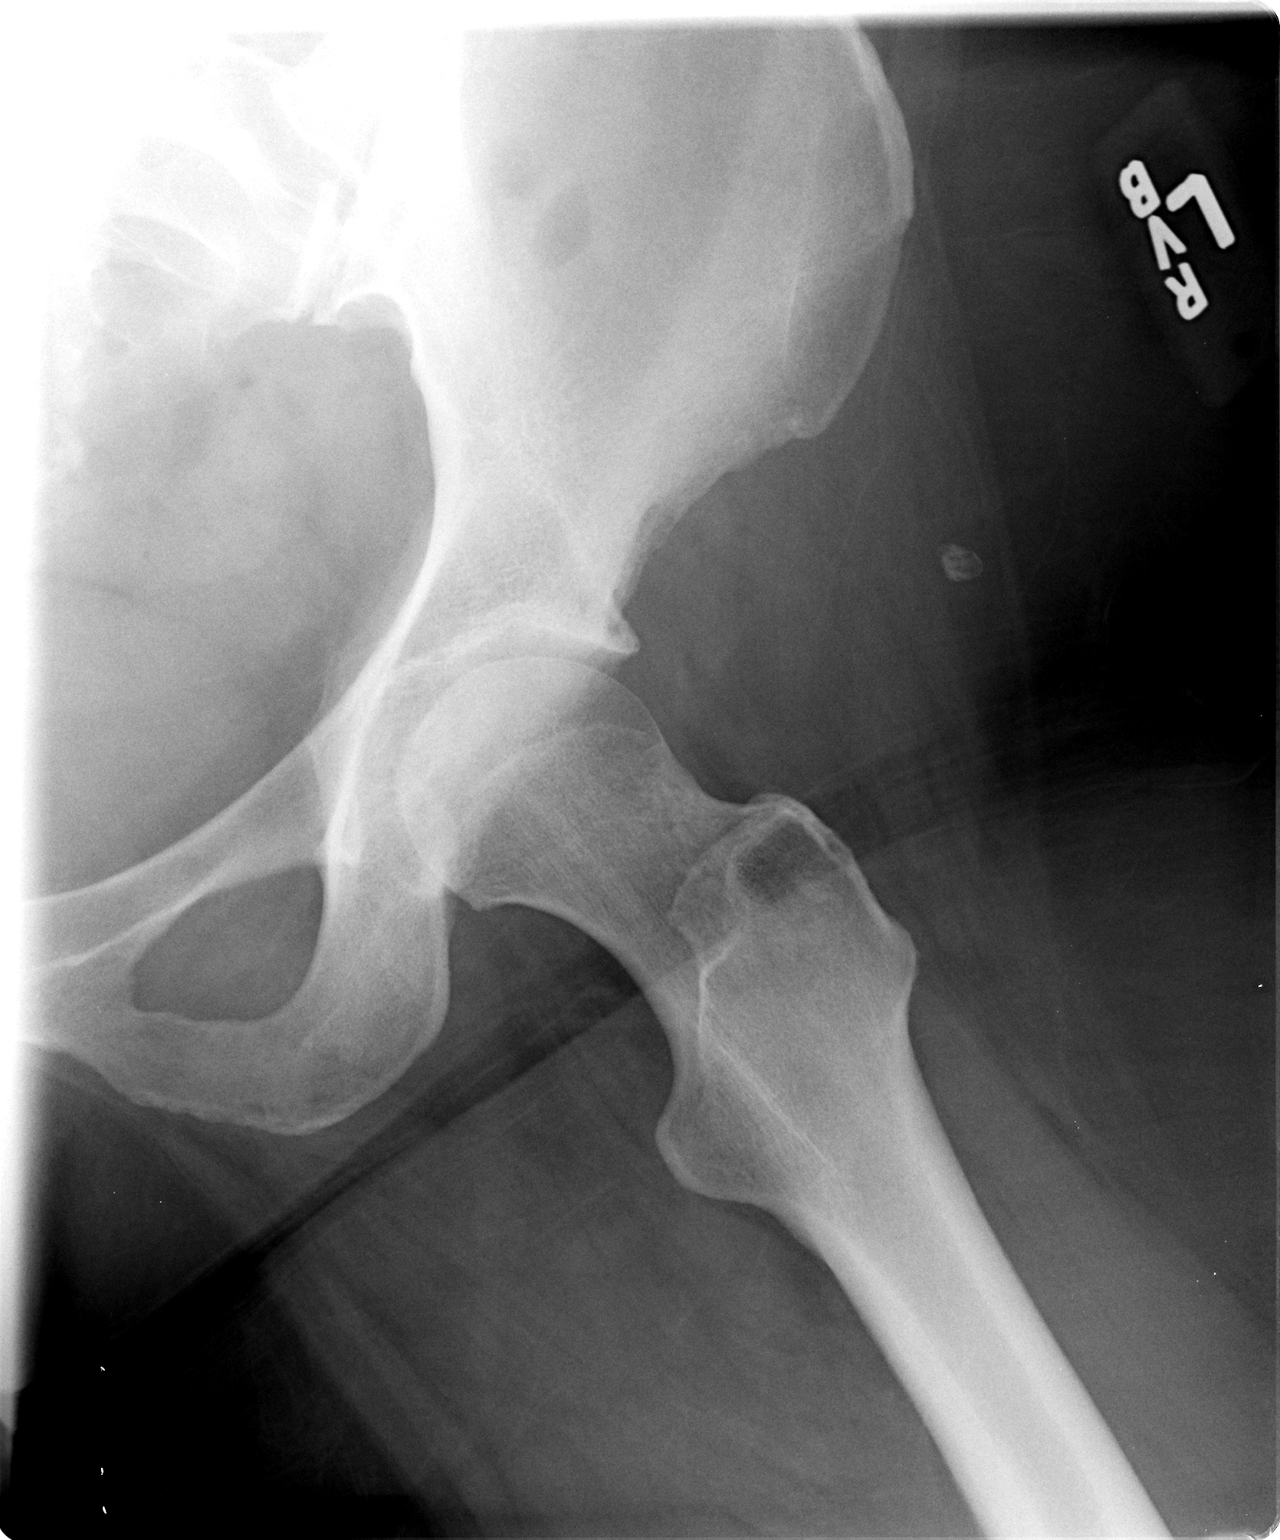

[3 of 3 positions shown; findings below may reference images not displayed]

FINDINGS: AP and lateral views of the left hip include AP view of
the pelvis. There is no evidence for acute fracture or dislocation.
No soft tissue foreign body or gas identified.
IMPRESSION: Negative exam.

## 2013-09-19 ENCOUNTER — Encounter: Payer: Self-pay | Admitting: Podiatrist

## 2013-09-19 ENCOUNTER — Ambulatory Visit (INDEPENDENT_AMBULATORY_CARE_PROVIDER_SITE_OTHER): Payer: PRIVATE HEALTH INSURANCE | Admitting: Podiatrist

## 2013-09-19 ENCOUNTER — Ambulatory Visit (INDEPENDENT_AMBULATORY_CARE_PROVIDER_SITE_OTHER): Payer: PRIVATE HEALTH INSURANCE

## 2013-09-19 VITALS — BP 125/80 | HR 80 | Resp 17 | Ht 66.0 in | Wt 200.0 lb

## 2013-09-19 DIAGNOSIS — M2040 Other hammer toe(s) (acquired), unspecified foot: Secondary | ICD-10-CM

## 2013-09-19 NOTE — Progress Notes (Signed)
   Subjective:    Patient ID: Stacy Herrera, female    DOB: [birth year], 68 y.o.   MRN: [id number]  HPI Comments: Pt states she has had problems and pain with the right 2, 3, 4th hammer toes for about 10 years.  Pt states her right 2, 3rd hammer toes have worsened in the last 2 months, and she has trimmed the callouses on the tips.     Review of Systems  Genitourinary: Positive for urgency.  All other systems reviewed and are negative.      Objective:   Physical Exam Patient is awake, alert, and oriented x 3.  In no acute distress.  Vascular status is intact with palpable pedal pulses at 2/4 DP and PT bilateral and capillary refill time within normal limits. Neurological sensation is also intact bilaterally via Semmes Weinstein monofilament at 5/5 sites. Light touch, vibratory sensation, Achilles tendon reflex is intact. Dermatological exam reveals skin color, turger and texture as normal. No open lesions present.  Musculature intact with dorsiflexion, plantarflexion, inversion, eversion.  She has rigid hammertoes on digits 2, 3, 4 of the right foot. Calluses are forming at the tips and they are painful. Mild prominent metatarsals are present bilateral. Left foot is within normal limits.      Assessment & Plan:  Hammertoe digits 2, 3, 4 right foot  Discussed conservative versus surgical options. Recommended a hammertoe repair digits 2, 3, 4 right foot with screw or pin fixation. The consent form was discussed and all three pages were signed and the patient's questions were encouraged and answered to the best of my ability. Risks of the surgery were discussed including but not limited to continued pain, infection, swelling, elevated toe, decreased range of motion,  suture or implant reaction, bleeding, decreased function, etc. Preoperative instructions were also dispensed to the patient as well as a preoperative surgical pamphlet to go along with the instructions. Surgery will be  scheduled at the patients convenience and patient will be seen at [facility] specialty surgery center on outpatient basis.The patient is instructed to call if any questions or concerns arise.

## 2013-09-19 NOTE — Patient Instructions (Signed)
Hammer Toes Hammer toes is a condition in which one or more of your toes is permanently flexed. CAUSES  This happens when a muscle imbalance or abnormal bone length makes your small toes buckle. This causes the toe joint to contract and the strong cord-like bands that attach muscles to the bones (tendons) in your toes to shorten.  SIGNS AND SYMPTOMS  Common symptoms of flexible hammer toes include:   A buildup of skin cells (corns). Corns occur where boney bumps come in frequent contact with hard surfaces. For example, where your shoes press and rub.  Irritation.  Inflammation.  Pain.  Limited motion in your toes. DIAGNOSIS  Hammer toes are diagnosed through a physical exam of your toes. During the exam, your health care provider may try to reproduce your symptoms by manipulating your foot. Often, X-ray exams are done to determine the degree of deformity and to make sure that the cause is not a fracture.  TREATMENT  Hammer toes can be treated with corrective surgery. There are several types of surgical procedures that can treat hammer toes. The most common procedures include:  Arthroplasty--A portion of the joint is surgically removed and your toe is straightened. The gap fills in with fibrous tissue. This procedure helps treat pain and deformity and helps restore function.  Fusion--Cartilage between the two bones of the affected joint is taken out and the bones fuse together into one longer bone. This helps keep your toe stable and reduces pain but leaves your toe stiff, yet straight.  Implantation--A portion of your bone is removed and replaced with an implant to restore motion.  Flexor tendon transfers--This procedure repositions the tendons that curl the toes down (flexor tendons). This may be done to release the deforming force that causes your toe to buckle. Several of these procedures require fixing your toe with a pin that is visible at the tip of your toe. The pin keeps the toe  straight during healing. Your health care provider will remove the pin usually within 4-8 weeks after the procedure.  Document Released: 02/11/2000 Document Revised: 02/18/2013 Document Reviewed: 10/21/2012 Exit[employer] Patient Information 2015 [employer]. This information is not intended to replace advice given to you by your health care provider. Make sure you discuss any questions you have with your health care provider. Pre-Operative Instructions  Congratulations, you have decided to take an important step to improving your quality of life.  You can be assured that the doctors of [facility] will be with you every step of the way.  1. Plan to be at the surgery center/hospital at least 1 (one) hour prior to your scheduled time unless otherwise directed by the surgical center/hospital staff.  You must have a responsible adult accompany you, remain during the surgery and drive you home.  Make sure you have directions to the surgical center/hospital and know how to get there on time. 2. For hospital based surgery you will need to obtain a history and physical form from your family physician within 1 month prior to the date of surgery- we will give you a form for you primary physician.  3. We make every effort to accommodate the date you request for surgery.  There are however, times where surgery dates or times have to be moved.  We will contact you as soon as possible if a change in schedule is required.   4. No Aspirin/Ibuprofen for one week before surgery.  If you are on aspirin, any non-steroidal anti-inflammatory medications (Mobic,   Aleve, Ibuprofen) you should stop taking it 7 days prior to your surgery.  You make take Tylenol  For pain prior to surgery.  5. Medications- If you are taking daily heart and blood pressure medications, seizure, reflux, allergy, asthma, anxiety, pain or diabetes medications, make sure the surgery center/hospital is aware before the day of surgery so they may  notify you which medications to take or avoid the day of surgery. 6. No food or drink after midnight the night before surgery unless directed otherwise by surgical center/hospital staff. 7. No alcoholic beverages 24 hours prior to surgery.  No smoking 24 hours prior to or 24 hours after surgery. 8. Wear loose pants or shorts- loose enough to fit over bandages, boots, and casts. 9. No slip on shoes, sneakers are best. 10. Bring your boot with you to the surgery center/hospital.  Also bring crutches or a walker if your physician has prescribed it for you.  If you do not have this equipment, it will be provided for you after surgery. 11. If you have not been contracted by the surgery center/hospital by the day before your surgery, call to confirm the date and time of your surgery. 12. Leave-time from work may vary depending on the type of surgery you have.  Appropriate arrangements should be made prior to surgery with your employer. 13. Prescriptions will be provided immediately following surgery by your doctor.  Have these filled as soon as possible after surgery and take the medication as directed. 14. Remove nail polish on the operative foot. 15. Wash the night before surgery.  The night before surgery wash the foot and leg well with the antibacterial soap provided and water paying special attention to beneath the toenails and in between the toes.  Rinse thoroughly with water and dry well with a towel.  Perform this wash unless told not to do so by your physician.  Enclosed: 1 Ice pack (please put in freezer the night before surgery)   1 Hibiclens skin cleaner   Pre-op Instructions  If you have any questions regarding the instructions, do not hesitate to call our office.  [place]: [address] [place], [place] [address] [phone]  [place]: [address] [place], [place] [address] [phone]  [place]: [address]  [place], [place] [address] [phone]  Dr. Richard Tuchman DPM, Dr.  Norman Regal DPM Dr. Richard Sikora DPM, Dr. M. Todd Hyatt DPM, Dr. Kathryn Egerton DPM 

## 2013-09-30 ENCOUNTER — Telehealth: Payer: Self-pay | Admitting: *Deleted

## 2013-09-30 NOTE — Telephone Encounter (Signed)
I need to reschedule surgery.  Call me and I'll see if I can provide any further information.  Thank You.

## 2013-09-30 NOTE — Telephone Encounter (Signed)
I'd like to reschedule my surgery from 10/29/2013 to 11/14/2013 which is a Friday.  I told her Dr. Valentina Lucks can't do surgery on Fridays, Wednesday is her surgical date.  She stated can we do it on the 23rd.  I told her that is fine.

## 2013-11-04 ENCOUNTER — Telehealth: Payer: Self-pay | Admitting: *Deleted

## 2013-11-04 ENCOUNTER — Other Ambulatory Visit: Payer: Self-pay | Admitting: Adult Health

## 2013-11-04 NOTE — Telephone Encounter (Signed)
I'm calling about my surgery that is scheduled for September 23, thank you.

## 2013-11-05 NOTE — Telephone Encounter (Signed)
I called her back.  She stated, "I just want to make sure my surgery is scheduled for September 23.  I told her it is scheduled.  She asked, "Do I need to see her again before I have the surgery or do I just show up?  I asked her if she has signed consent forms.  She stated, "No."  I checked and she had already signed the consent form.  She asked, what time she has to be there.  I told her the surgical center will call her a day or two before and let her know exactly what time to be there.  I told her not to eat or drink anything after midnight the night before and that someone will need to be with her the day of surgery due to her being under local/ IV sedation.  She asked where they are located.  I told her [facility] [employer].  I gave her the phone number.  I asked her to go on-line and fill out the necessary forms for the surgical center.  She stated, "I think you answered all my questions, thanks for calling."

## 2013-11-19 DIAGNOSIS — E785 Hyperlipidemia, unspecified: Secondary | ICD-10-CM | POA: Diagnosis not present

## 2013-11-19 DIAGNOSIS — M2040 Other hammer toe(s) (acquired), unspecified foot: Secondary | ICD-10-CM

## 2013-11-21 ENCOUNTER — Telehealth: Payer: Self-pay

## 2013-11-21 NOTE — Telephone Encounter (Signed)
Left message for pt to call with questions or concerns

## 2013-11-26 ENCOUNTER — Ambulatory Visit (INDEPENDENT_AMBULATORY_CARE_PROVIDER_SITE_OTHER): Payer: PRIVATE HEALTH INSURANCE | Admitting: Podiatrist

## 2013-11-26 ENCOUNTER — Encounter: Payer: PRIVATE HEALTH INSURANCE | Admitting: Podiatrist

## 2013-11-26 ENCOUNTER — Ambulatory Visit (INDEPENDENT_AMBULATORY_CARE_PROVIDER_SITE_OTHER): Payer: PRIVATE HEALTH INSURANCE

## 2013-11-26 VITALS — BP 115/66 | HR 75 | Resp 16

## 2013-11-26 DIAGNOSIS — M2040 Other hammer toe(s) (acquired), unspecified foot: Secondary | ICD-10-CM

## 2013-11-26 DIAGNOSIS — M2041 Other hammer toe(s) (acquired), right foot: Secondary | ICD-10-CM

## 2013-11-26 MED ORDER — HYDROCODONE-ACETAMINOPHEN 10-325 MG PO TABS: 1.0000 | ORAL_TABLET | ORAL | Status: DC | PRN

## 2013-11-26 NOTE — Progress Notes (Signed)
Subjective: Patient presents today1 week status post foot surgery of the right foot.  Date of surgery 11-19-13 - Hammertoe repair 2nd with screw fixation and hammertoe with pin fixation digits 3,4. Patient denies nausea, vomiting, fevers, chills or night sweats.  Denies calf pain or tenderness to the operative side. States the pain medication work for the pain however it makes her itch. She would like to know if there is an equal strength hydrocodone and she does well with hydrocodone.  Objective:  Neurovascular status is intact with palpable pedal pulses DP and PT bilateral at 2+ out of 4. Neurological sensation is intact and unchanged as per prior to surgery. Excellent appearance of the postoperative foot is noted. Rectus alignment of digits 2, 3, 4 noted both clinically and radiographically. Bruising at the distal tip of the right fourth toe is noted consistent with the screw fixation. Pin fixation in good position and alignment.  Assessment: Status post hammertoe repair digits 2, 3, 4 right foot  Plan:  X-rays taken the foot was redressed with a dry, sterile and compressive dressing. I switched her pain medication to hydrocodone 10/325. She will stay in her Darco shoe and sutures will be removed at the next visit. I will then see her 3 weeks after that for pin removal. If any problems or concerns arise prior that visit she will call.

## 2013-12-05 ENCOUNTER — Other Ambulatory Visit: Payer: PRIVATE HEALTH INSURANCE

## 2013-12-18 ENCOUNTER — Ambulatory Visit (INDEPENDENT_AMBULATORY_CARE_PROVIDER_SITE_OTHER): Payer: PRIVATE HEALTH INSURANCE | Admitting: Podiatrist

## 2013-12-18 DIAGNOSIS — M2041 Other hammer toe(s) (acquired), right foot: Secondary | ICD-10-CM

## 2013-12-19 ENCOUNTER — Encounter: Payer: PRIVATE HEALTH INSURANCE | Admitting: Podiatrist

## 2013-12-19 NOTE — Progress Notes (Signed)
Subjective: Patient presents today 4 weeks status post foot surgery of the right foot. Date of surgery 11-19-13 - Hammertoe repair 2nd with screw fixation and hammertoe with pin fixation digits 3,4. Patient denies nausea, vomiting, fevers, chills or night sweats. Denies calf pain or tenderness to the operative side.    Objective: Neurovascular status is intact with palpable pedal pulses DP and PT bilateral at 2+ out of 4. Neurological sensation is intact and unchanged as per prior to surgery. Excellent appearance of the postoperative foot is noted. Rectus alignment of digits 2, 3, 4 noted both clinically and radiographically. Bruising at the distal tip of the right second toe is noted consistent with the screw fixation. Pin fixation in good position and alignment.   Assessment: Status post hammertoe repair digits 2, 3, 4 right foot   Plan:  Pin fixation was removed.  Compressive dressing applied.  Gave instructions for aftercare.  i will see her back in 2 weeks for recheck.  If any problems or concerns arise prior that visit she will call.

## 2013-12-29 ENCOUNTER — Encounter: Payer: Self-pay | Admitting: Podiatrist

## 2014-01-02 ENCOUNTER — Encounter: Payer: Self-pay | Admitting: Podiatrist

## 2014-01-02 ENCOUNTER — Ambulatory Visit (INDEPENDENT_AMBULATORY_CARE_PROVIDER_SITE_OTHER): Payer: PRIVATE HEALTH INSURANCE | Admitting: Podiatrist

## 2014-01-02 ENCOUNTER — Ambulatory Visit (INDEPENDENT_AMBULATORY_CARE_PROVIDER_SITE_OTHER): Payer: PRIVATE HEALTH INSURANCE

## 2014-01-02 VITALS — BP 128/62 | HR 75 | Resp 16

## 2014-01-02 DIAGNOSIS — M2041 Other hammer toe(s) (acquired), right foot: Secondary | ICD-10-CM

## 2014-01-02 DIAGNOSIS — Z9889 Other specified postprocedural states: Secondary | ICD-10-CM

## 2014-01-02 MED ORDER — HYDROCODONE-ACETAMINOPHEN 10-325 MG PO TABS: 1.0000 | ORAL_TABLET | Freq: Four times a day (QID) | ORAL | Status: DC | PRN

## 2014-01-02 MED ORDER — SULFAMETHOXAZOLE-TRIMETHOPRIM 800-160 MG PO TABS: 1.0000 | ORAL_TABLET | Freq: Two times a day (BID) | ORAL | Status: DC

## 2014-01-02 NOTE — Patient Instructions (Addendum)
Keep the dressing on your toe for the next week-- i called in an antibiotic to your pharmacy-- go ahead and start taking that medication.

## 2014-01-06 NOTE — Progress Notes (Signed)
Subjective: Patient presents today with her son for postoperative follow-up status post hammertoe surgery digits 2, 3, 4 of the right foot. She states the right second toe is been painful and appears to be swelling at the tip. She denies any trauma or injury to the toe.  Objective: Neurovascular status is intact and unchanged. The tip of the right second toe is swollen and red. X-rays are taken it does appear that the screw is starting to become more prominent the tip of the toe and is uncomfortable.  Assessment: Status post hammertoe repair with screw fixation second digit right foot and removablepin fixation digits 3and #4 right foot  Plan: Recommended removal of the screw at today's visit. The patient agreed and the toe was prepped with alcohol and anesthetized with lidocaine and Marcaine plain. Once anesthetized he was prepped with Betadine and a sterile prep was performed. Utilizing a Coker the screw was removed with some difficulty. It was however finally removed and the skin was reapproximated with 5-0 nylon. I will see her back in 1 week for follow-up in the meantime she will keep the toe clean and dry. I refilled her pain medication and ready prescription for antibiotics to take. If she has any concerns or problems she will call otherwise I'll see her back in a week

## 2014-01-09 ENCOUNTER — Encounter: Payer: Self-pay | Admitting: Podiatrist

## 2014-01-09 ENCOUNTER — Ambulatory Visit (INDEPENDENT_AMBULATORY_CARE_PROVIDER_SITE_OTHER): Payer: PRIVATE HEALTH INSURANCE | Admitting: Podiatrist

## 2014-01-09 VITALS — BP 112/73 | HR 77 | Resp 16

## 2014-01-09 DIAGNOSIS — Z9889 Other specified postprocedural states: Secondary | ICD-10-CM

## 2014-01-09 DIAGNOSIS — M2041 Other hammer toe(s) (acquired), right foot: Secondary | ICD-10-CM

## 2014-01-09 MED ORDER — HYDROMORPHONE HCL 2 MG PO TABS: 2.0000 mg | ORAL_TABLET | ORAL | Status: DC | PRN

## 2014-01-09 NOTE — Progress Notes (Signed)
Subjective: Patient presents today for follow-up of right second toe and hammertoe correction digits 3 and 4 of the right foot. We removed the screw at the last visit and she states that the toe is still severely painful. She states it keeps her up at night and she is unable to rest. Her daughter also states she still unable to walk well without limping.  Objective: Second toe appearance is improved. Swelling is still present within the toe itself. Suture at the tip of the toe is removed as the incision site is well coapted. No redness is noted. No drainage is seen. Overall normal appearance status post screw removal of the second toe.  Assessment: Status post hammertoe corrective surgery and removal of screw in office11/07/2013  Plan: Sutures removed at today's visit. Increased her pain medication to die lauded for nighttime discomfort. Recommended wrapping the toe in a flex wrapping gave her instructions on how to do this. Also recommended continuation of the surgical shoe until she feels comfortable in a normal shoe. Likely her pain is due to the swelling of the toe itself and this will resolve since we removed the screw at the last visit. I will See her back for follow-up and for a postop check.

## 2014-01-13 ENCOUNTER — Other Ambulatory Visit: Payer: Self-pay | Admitting: Obstetrics & Gynecology

## 2014-01-13 DIAGNOSIS — Z1231 Encounter for screening mammogram for malignant neoplasm of breast: Secondary | ICD-10-CM

## 2014-01-30 ENCOUNTER — Encounter: Payer: PRIVATE HEALTH INSURANCE | Admitting: Podiatrist

## 2014-02-04 ENCOUNTER — Encounter: Payer: Self-pay | Admitting: Podiatrist

## 2014-02-04 ENCOUNTER — Ambulatory Visit (INDEPENDENT_AMBULATORY_CARE_PROVIDER_SITE_OTHER): Payer: PRIVATE HEALTH INSURANCE

## 2014-02-04 ENCOUNTER — Ambulatory Visit (INDEPENDENT_AMBULATORY_CARE_PROVIDER_SITE_OTHER): Payer: PRIVATE HEALTH INSURANCE | Admitting: Podiatrist

## 2014-02-04 VITALS — BP 112/68 | HR 79 | Resp 12

## 2014-02-04 DIAGNOSIS — Z9889 Other specified postprocedural states: Secondary | ICD-10-CM

## 2014-02-04 DIAGNOSIS — M2041 Other hammer toe(s) (acquired), right foot: Secondary | ICD-10-CM

## 2014-02-04 MED ORDER — EFINACONAZOLE 10 % EX SOLN: 1.0000 [drp] | Freq: Every day | CUTANEOUS | Status: DC

## 2014-02-11 ENCOUNTER — Ambulatory Visit (HOSPITAL_COMMUNITY)
Admission: RE | Admit: 2014-02-11 | Discharge: 2014-02-11 | Disposition: A | Discharge status: Home / Self Care | Payer: PRIVATE HEALTH INSURANCE | Source: Ambulatory Visit | Attending: Obstetrics & Gynecology | Admitting: Obstetrics & Gynecology

## 2014-02-11 DIAGNOSIS — Z1231 Encounter for screening mammogram for malignant neoplasm of breast: Secondary | ICD-10-CM | POA: Diagnosis not present

## 2014-02-16 NOTE — Progress Notes (Signed)
Subjective: Patient presents today for follow-up of right second toe and hammertoe correction digits 3 and 4 of the right foot. We removed the screw at a previous visit and she states that the toe has improved however continues to swell more than the other 2 digits that we worked on.   Objective: Second toe appearance is improved. Swelling is still present within the toe itself.  No redness is noted. No drainage is seen. Overall normal swollen appearance status post arthroplasty and subsequent screw removal of the second toe.  Assessment: Status post hammertoe corrective surgery and removal of screw  Plan:  Recommended continued wrapping the toe in a flex wrapping gave her instructions on how to do this. She may wear her normal shoes as long as she is comfortable. Recommended milking out the swelling on the toe daily. May consider a steroid injection at the next visit if swelling is still present. At this point appears to be improving on its own.

## 2014-03-04 ENCOUNTER — Ambulatory Visit (INDEPENDENT_AMBULATORY_CARE_PROVIDER_SITE_OTHER): Payer: PRIVATE HEALTH INSURANCE | Admitting: Podiatrist

## 2014-03-04 ENCOUNTER — Encounter: Payer: Self-pay | Admitting: Podiatrist

## 2014-03-04 ENCOUNTER — Ambulatory Visit (INDEPENDENT_AMBULATORY_CARE_PROVIDER_SITE_OTHER): Payer: PRIVATE HEALTH INSURANCE

## 2014-03-04 VITALS — BP 114/54 | HR 76 | Resp 12

## 2014-03-04 DIAGNOSIS — M2041 Other hammer toe(s) (acquired), right foot: Secondary | ICD-10-CM

## 2014-03-04 DIAGNOSIS — Z9889 Other specified postprocedural states: Secondary | ICD-10-CM

## 2014-03-04 MED ORDER — EFINACONAZOLE 10 % EX SOLN: 1.0000 [drp] | Freq: Every day | CUTANEOUS | Status: DC

## 2014-03-09 NOTE — Progress Notes (Signed)
Subjective: Patient presents today for follow-up of right second toe and hammertoe correction digits 3 and 4 of the right foot. We removed the screw at a previous visit and she states that the toe has improved however continues to swell more than the other 2 digits that we worked on. This is consistent since her last visit however the second toe has decreased significantly since I saw her last. Overall she's is pleased with the progress albeit slow.  Objective: Second toe appearance is improved. Swelling is still present within the toe itself especially when compared to the third and fourth toe however it has decreased significantly since the last visit.  No redness is noted. No drainage is seen. Overall normal swollen appearance status post arthroplasty and subsequent screw removal of the second toe. X-ray show healing of the second toe with bridging at the fusion site  Assessment: Status post hammertoe corrective surgery and removal of screw  Plan:  Recommended continued wrapping the toe in a flex wrapping gave her instructions on how to do this. May continue to wear her normal shoes. If she has any problems or concerns in the future she'll call her if the swelling does not continue to resolve she will call otherwise she is discharged at this point postoperatively.

## 2014-04-27 IMAGING — MG MM DIGITAL SCREENING BILAT
4 series · 4 of 4 positions shown · non-contrast
Comparison: Previous exams.

CLINICAL DATA: Screening.

DIGITAL BILATERAL SCREENING MAMMOGRAM WITH CAD

[L CC]
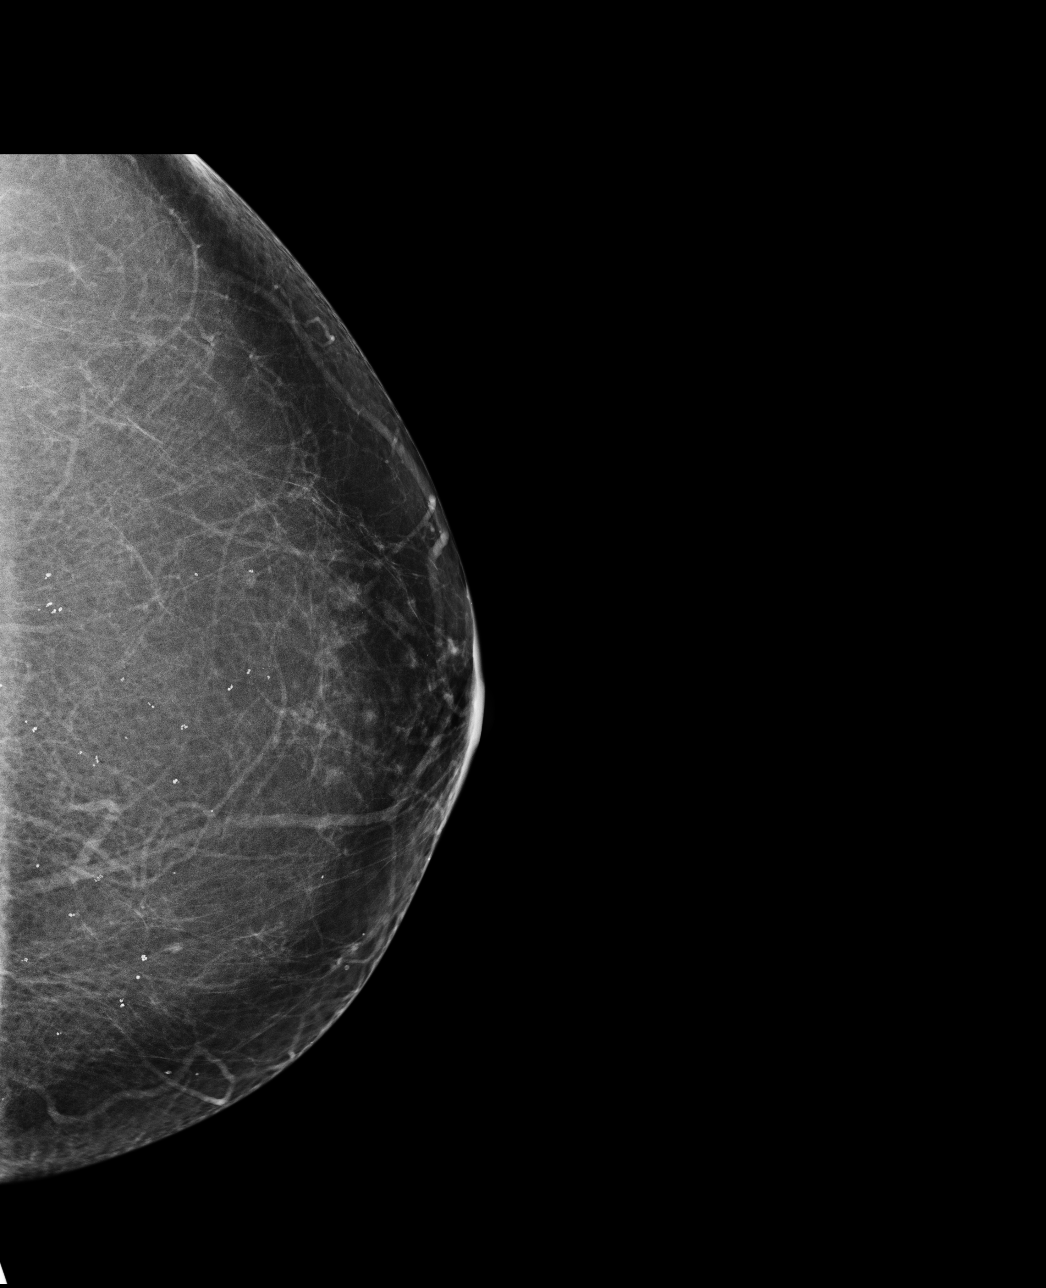

[L MLO]
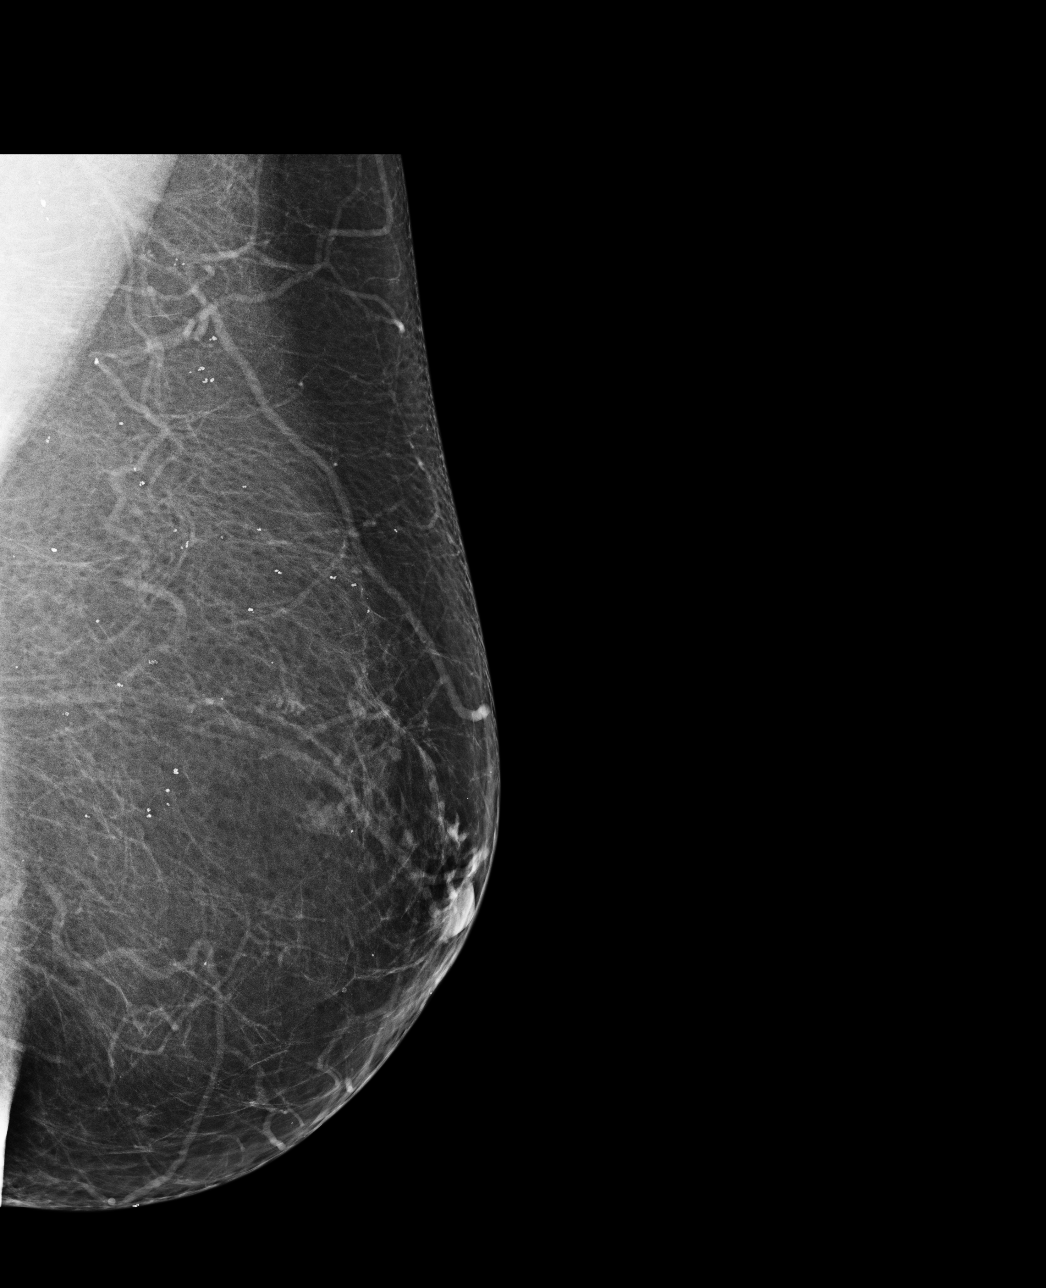

[R CC]
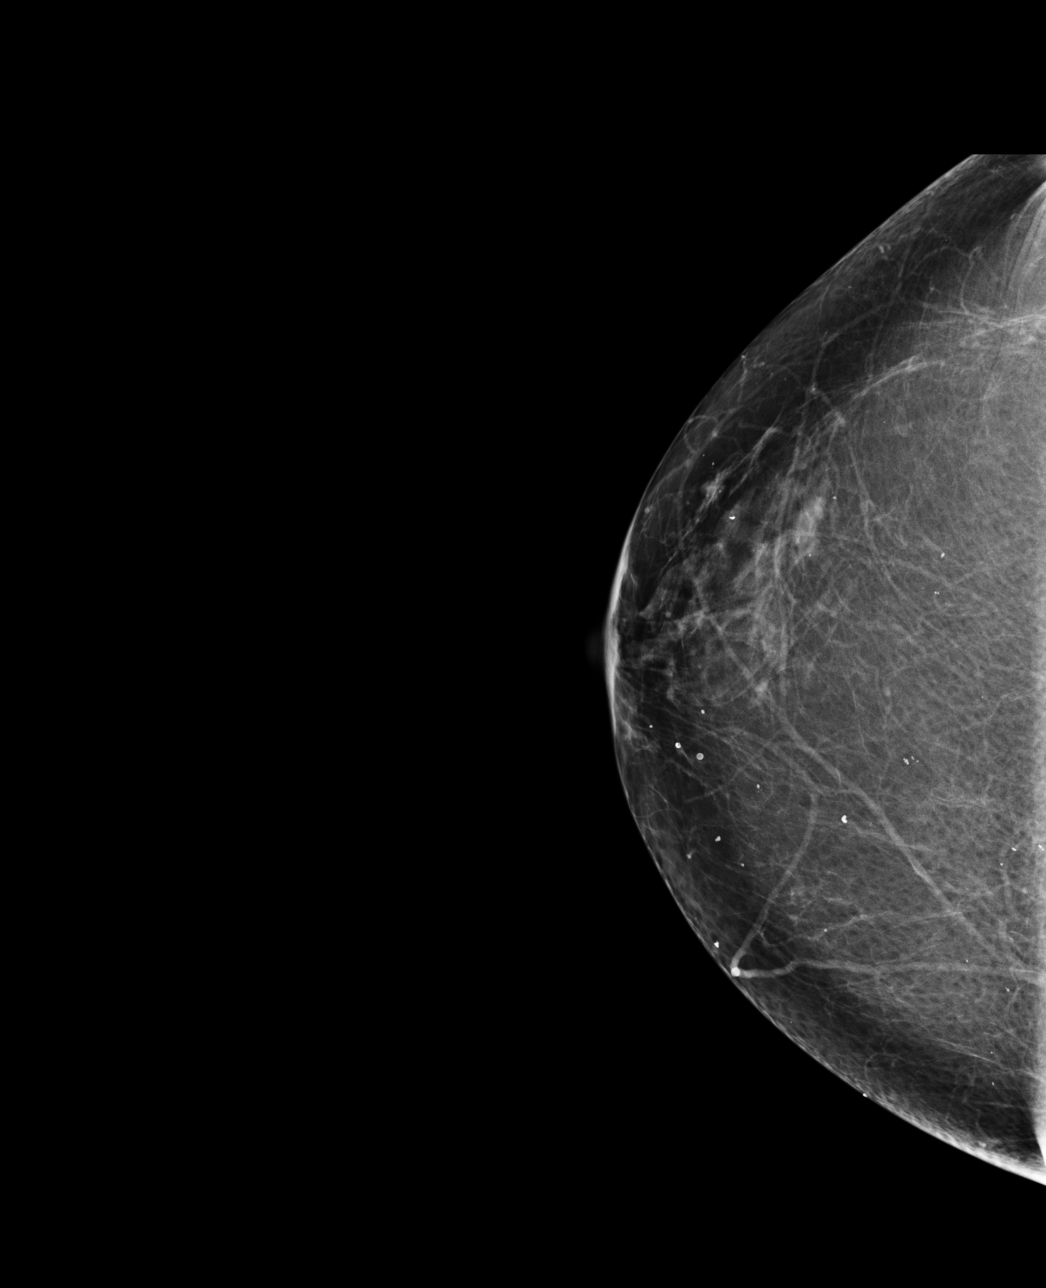

[R MLO]
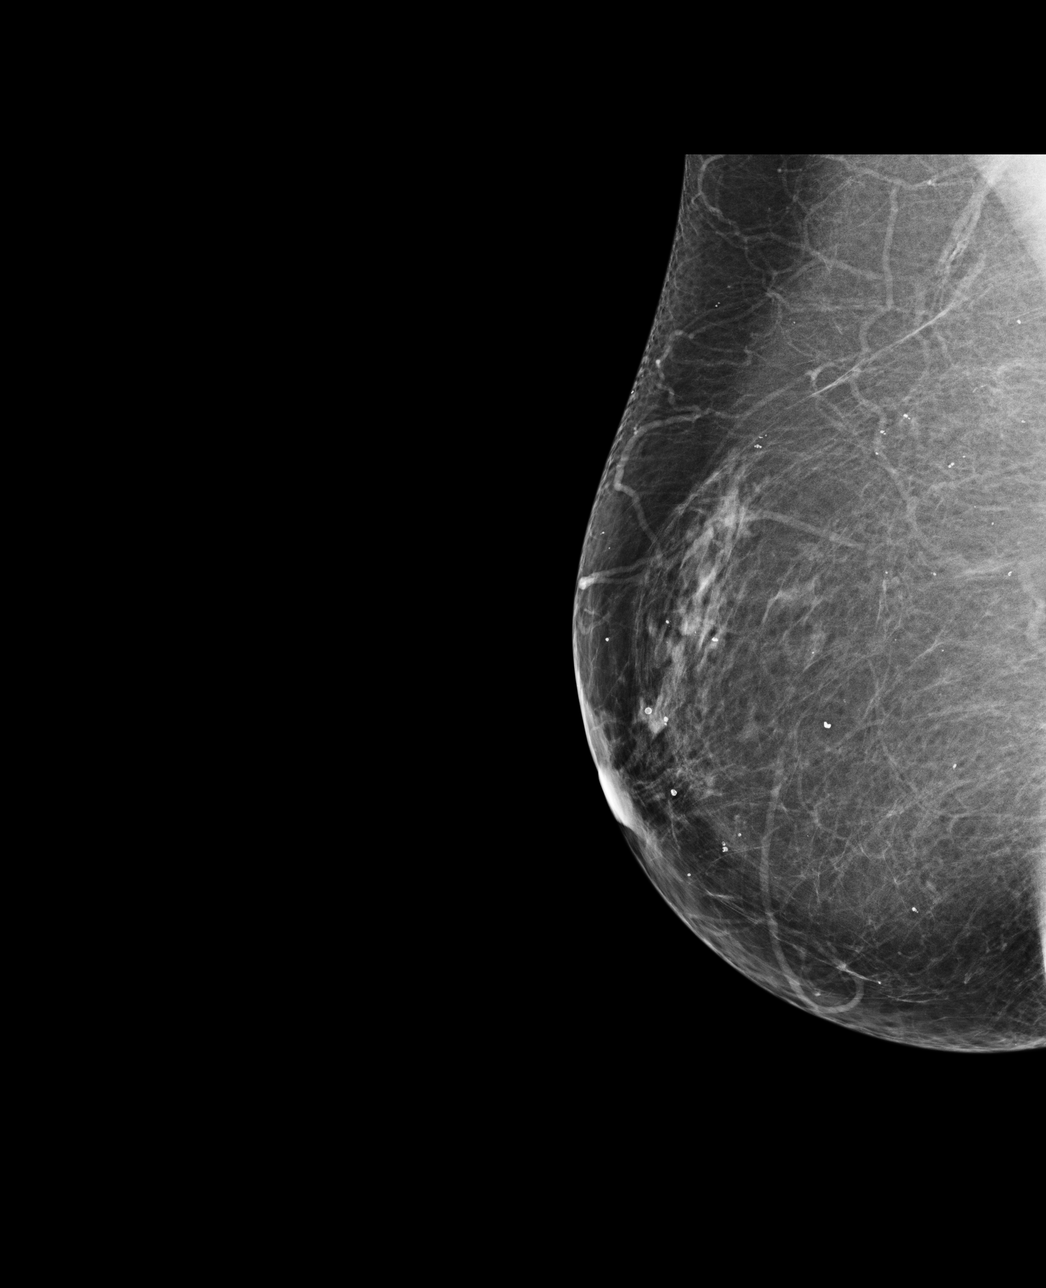

[4 of 4 positions shown; findings below may reference images not displayed]

FINDINGS: ACR Breast Density Category 1: The breast tissue is almost entirely
fatty.

No suspicious masses, architectural distortion, or calcifications
are present.

Images were processed with CAD.
IMPRESSION: No mammographic evidence of malignancy.

A result letter of this screening mammogram will be mailed directly
to the patient.

RECOMMENDATION:
Screening mammogram in one year. (Code:GD-5-HCE)

BI-RADS CATEGORY 1:  Negative.

## 2014-04-29 DIAGNOSIS — I87323 Chronic venous hypertension (idiopathic) with inflammation of bilateral lower extremity: Secondary | ICD-10-CM | POA: Diagnosis not present

## 2014-05-21 DIAGNOSIS — Z85828 Personal history of other malignant neoplasm of skin: Secondary | ICD-10-CM | POA: Diagnosis not present

## 2014-05-21 DIAGNOSIS — I781 Nevus, non-neoplastic: Secondary | ICD-10-CM | POA: Diagnosis not present

## 2014-05-21 DIAGNOSIS — Z08 Encounter for follow-up examination after completed treatment for malignant neoplasm: Secondary | ICD-10-CM | POA: Diagnosis not present

## 2014-05-21 DIAGNOSIS — X32XXXD Exposure to sunlight, subsequent encounter: Secondary | ICD-10-CM | POA: Diagnosis not present

## 2014-05-21 DIAGNOSIS — L570 Actinic keratosis: Secondary | ICD-10-CM | POA: Diagnosis not present

## 2014-05-27 ENCOUNTER — Other Ambulatory Visit: Payer: Self-pay | Admitting: Obstetrics & Gynecology

## 2014-05-28 DIAGNOSIS — I87323 Chronic venous hypertension (idiopathic) with inflammation of bilateral lower extremity: Secondary | ICD-10-CM | POA: Diagnosis not present

## 2014-06-04 IMAGING — MR MR LUMBAR SPINE W/O CM
4 of 5 series · 13 of 48 positions shown · non-contrast
Comparison: Radiographs dated 01/08/2012 and CT scan of the abdomen
dated 10/18/2005

CLINICAL DATA: Low back pain radiating into the left buttock and
hip.

MRI LUMBAR SPINE WITHOUT CONTRAST
TECHNIQUE: Multiplanar and multiecho pulse sequences of the lumbar
spine were obtained without intravenous contrast.

[Series 3: T2 · sagittal · 4.0mm · 0.34mm/px · 4 of 12 slices shown (1 of 2)]
[im 1/12]
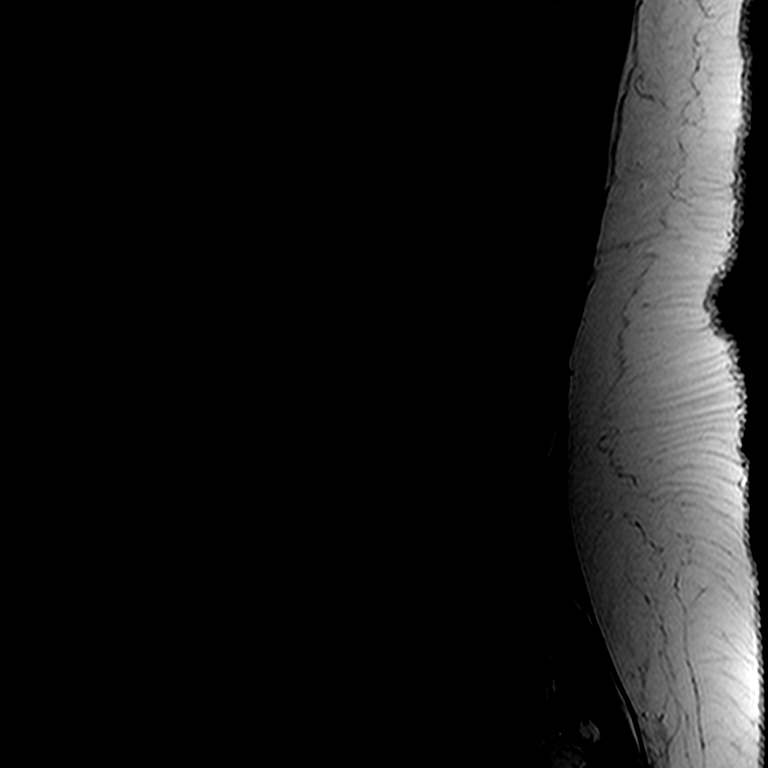
[im 3/12]
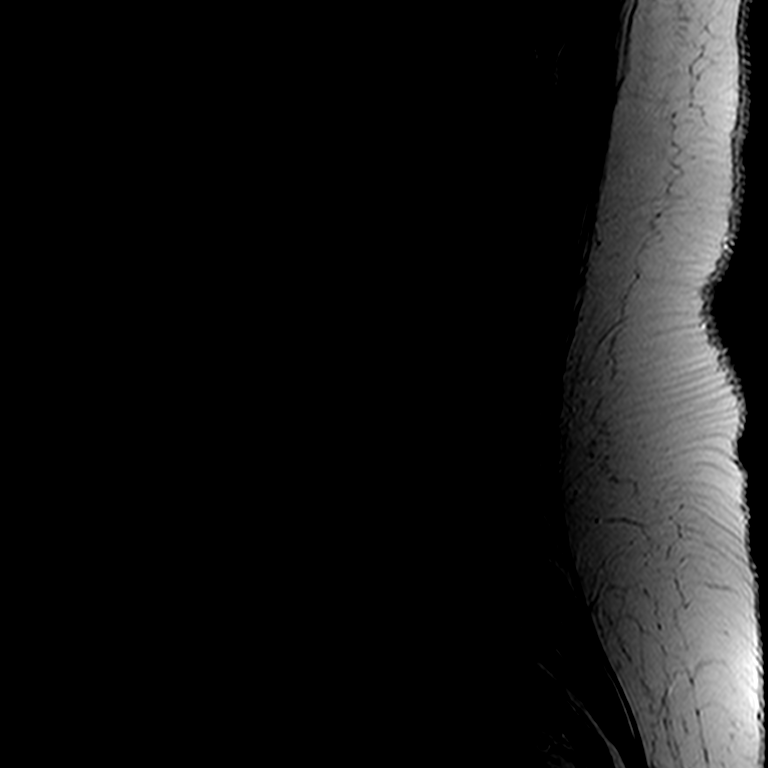
[im 6/12]
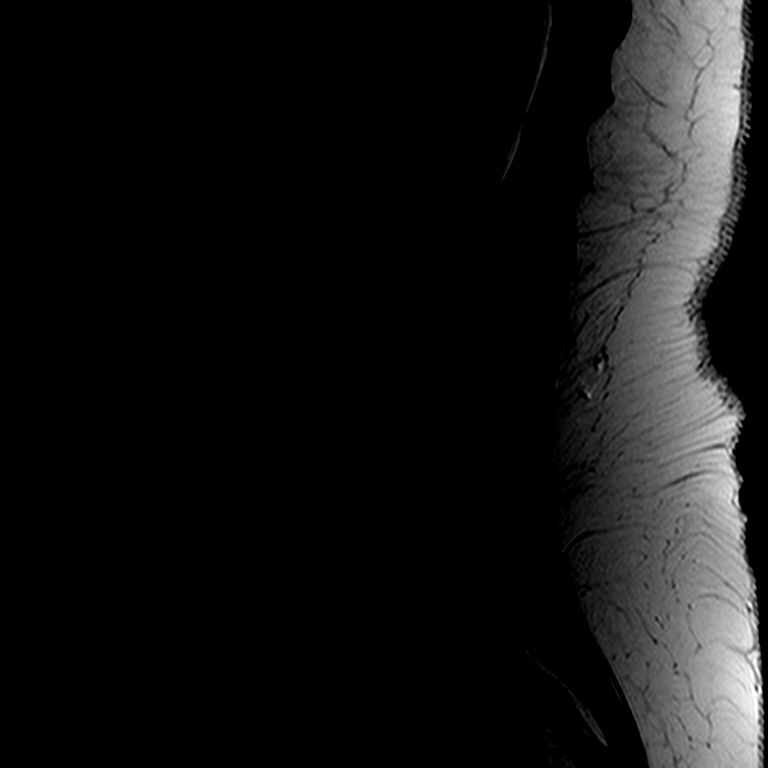
[im 12/12]
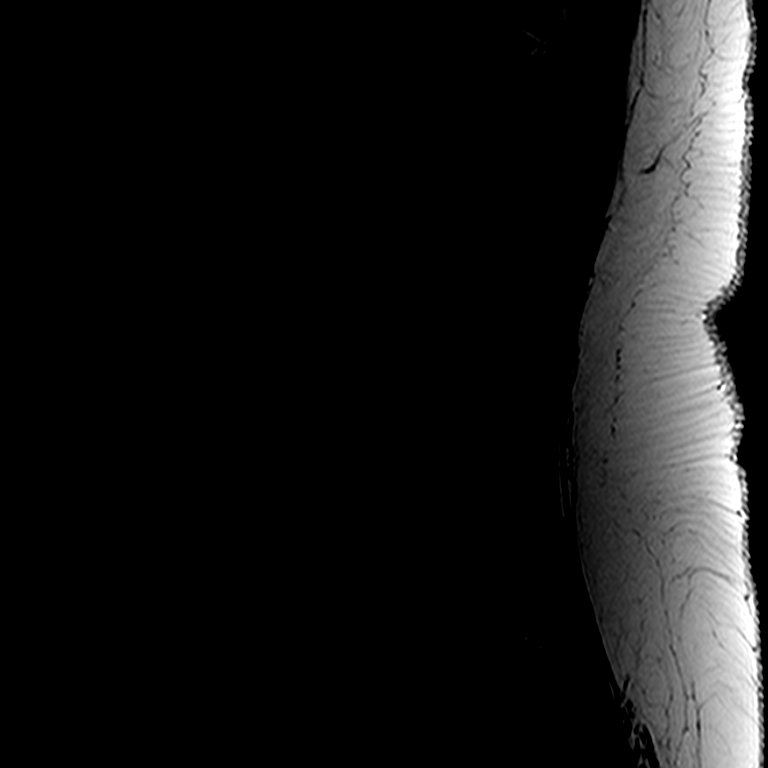

[Series 4: T1 · sagittal · 4.0mm · 0.40mm/px · 3 of 12 slices shown (1 of 2)]
[im 1/12]
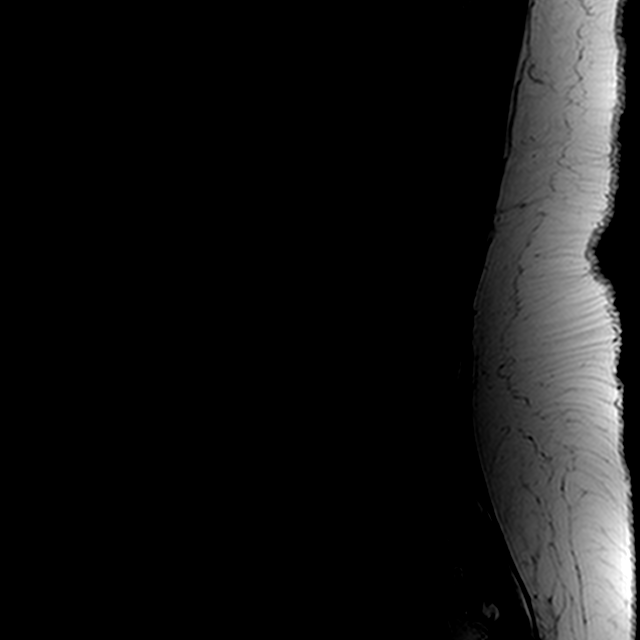
[im 6/12]
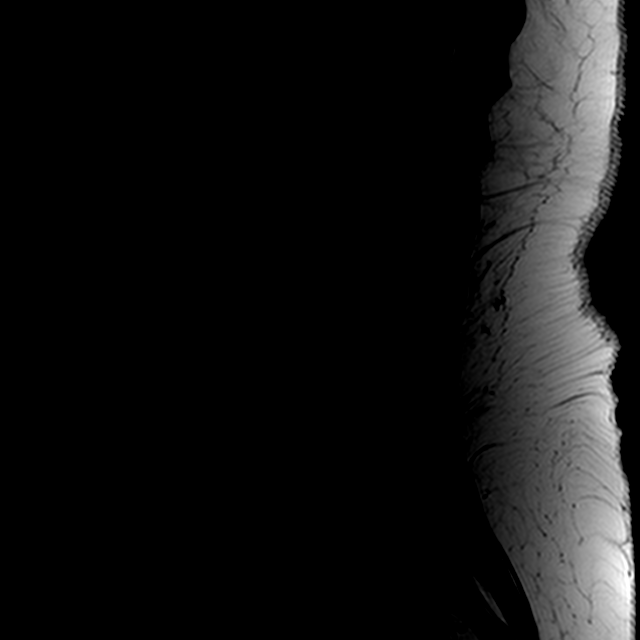
[im 12/12]
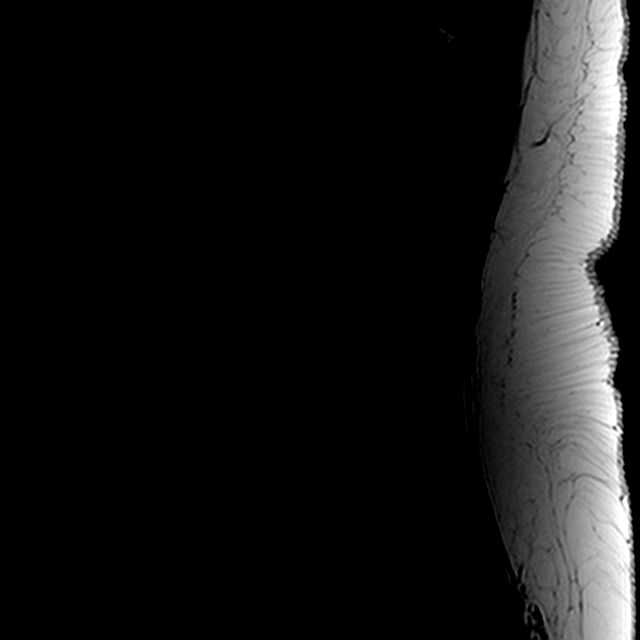

[Series 6: T2 · axial · 4.0mm · 0.24mm/px · z∈[-76,+49]mm · 3 of 34 slices shown (2 of 2)]
[im 5/34]
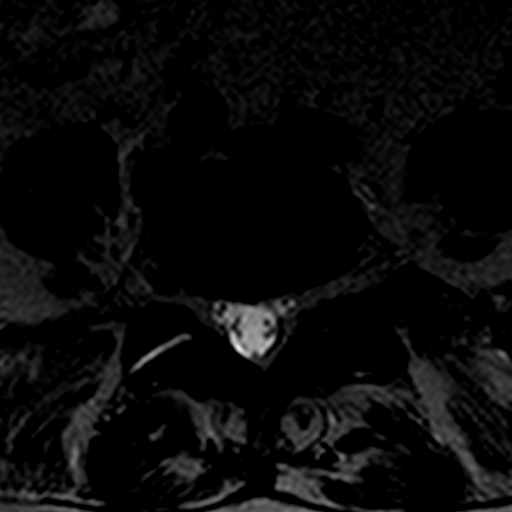
[im 18/34]
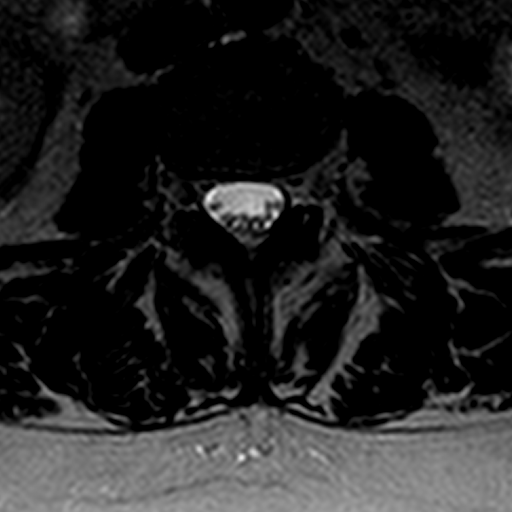
[im 29/34]
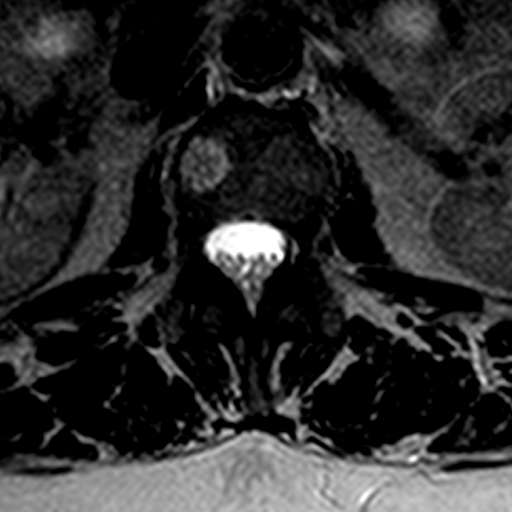

[Series 7: T1 · axial · 4.0mm · 0.24mm/px · z∈[-77,+50]mm · 3 of 34 slices shown (2 of 2)]
[im 5/34]
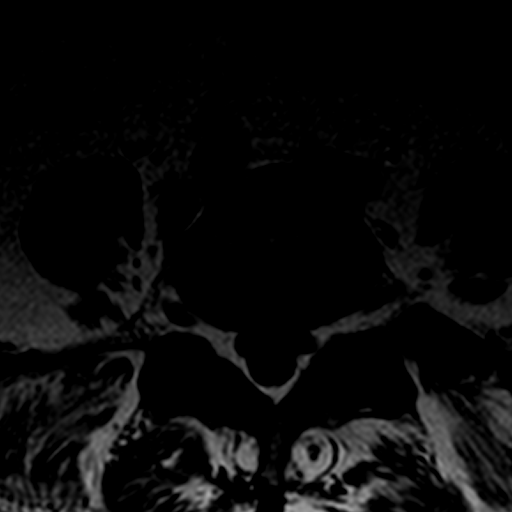
[im 18/34]
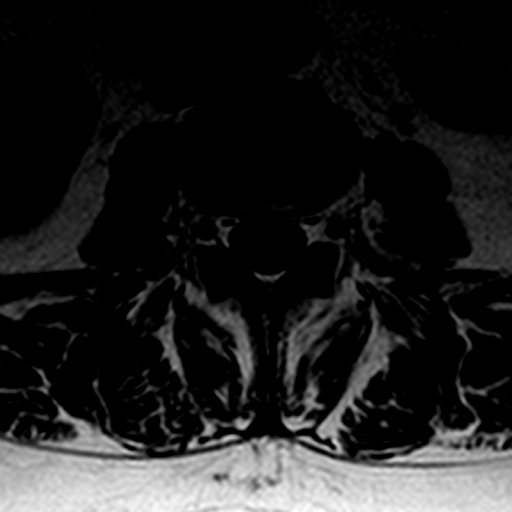
[im 29/34]
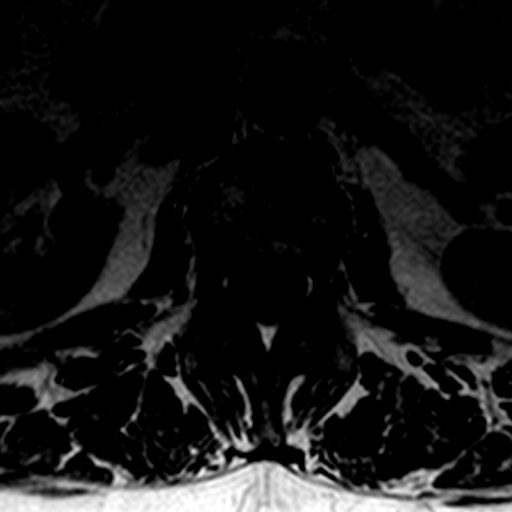

[13 of 48 positions shown; findings below may reference images not displayed]

FINDINGS: Tip of the conus is at L1-2 and appears normal.  Normal
paraspinal soft tissues.

 T12-L1 through L3-4: Normal discs.  Benign hemangioma in the L2
vertebral body.  Slight left facet arthritis at L2-3 and slight
bilateral facet arthritis at L3-4.

L4-5: Severe bilateral facet arthritis at L4-5 with a 3.5 mm
spondylolisthesis.  Minimal bulging of the uncovered disc without
neural impingement. Minimal narrowing of the spinal canal.  No
foraminal stenosis.

L5-S1:  The disc is normal.  Tiny effusion of the right facet
joint.
IMPRESSION: 1.  Severe bilateral facet arthritis at L4-5 with grade 1
spondylolisthesis but without significant spinal stenosis or neural
impingement.
2.  Slight facet arthritis at L2-3 on the left and bilaterally at
L3-4.

## 2014-06-29 ENCOUNTER — Other Ambulatory Visit: Payer: Self-pay | Admitting: Adult Health

## 2014-10-08 ENCOUNTER — Ambulatory Visit (HOSPITAL_COMMUNITY)
Admission: RE | Admit: 2014-10-08 | Discharge: 2014-10-08 | Disposition: A | Discharge status: Home / Self Care | Payer: PRIVATE HEALTH INSURANCE | Source: Ambulatory Visit | Attending: Family Medicine | Admitting: Family Medicine

## 2014-10-08 ENCOUNTER — Other Ambulatory Visit (HOSPITAL_COMMUNITY): Payer: Self-pay | Admitting: Family Medicine

## 2014-10-08 ENCOUNTER — Encounter: Payer: Self-pay | Admitting: Podiatry

## 2014-10-08 ENCOUNTER — Ambulatory Visit (INDEPENDENT_AMBULATORY_CARE_PROVIDER_SITE_OTHER): Payer: PRIVATE HEALTH INSURANCE | Admitting: Podiatry

## 2014-10-08 VITALS — BP 120/70 | HR 68 | Resp 16

## 2014-10-08 DIAGNOSIS — M79672 Pain in left foot: Secondary | ICD-10-CM | POA: Diagnosis not present

## 2014-10-08 DIAGNOSIS — M79671 Pain in right foot: Secondary | ICD-10-CM

## 2014-10-08 DIAGNOSIS — M722 Plantar fascial fibromatosis: Secondary | ICD-10-CM | POA: Diagnosis not present

## 2014-10-08 DIAGNOSIS — M7732 Calcaneal spur, left foot: Secondary | ICD-10-CM | POA: Diagnosis not present

## 2014-10-08 MED ORDER — TRIAMCINOLONE ACETONIDE 10 MG/ML IJ SUSP
10.0000 mg | Freq: Once | INTRAMUSCULAR | Status: AC
  Administered 2014-10-08: 10 mg

## 2014-10-08 NOTE — Progress Notes (Signed)
Subjective:     Patient ID: Stacy Herrera, female   DOB: [birth year], 69 y.o.   MRN: [id number]  HPI patient states she's developed a lot of pain in the plantar left heel and she is due to go on vacation and cannot walk on it. States that she went to urgent care had x-rays which were negative and that she needs something to try to help her   Review of Systems     Objective:   Physical Exam Neurovascular status intact muscle strength was adequate and I noted there is good digital perfusion. Patient has exquisite discomfort left plantar fascial insertional point of the plantar fascia into the calcaneus with moderate depression of the arch also noted    Assessment:     Plantar fasciitis left acute in nature along with moderate depression of the arch    Plan:     H&P and I reviewed her x-rays and that I went ahead and injected the left plantar fascia 3 mg Kenalog 5 mg Xylocaine Marcaine mixture and I dispensed a fascial brace to lift the arch up. Patient will be seen back to recheck

## 2014-11-04 ENCOUNTER — Encounter: Payer: Self-pay | Admitting: Podiatry

## 2014-11-04 ENCOUNTER — Ambulatory Visit (INDEPENDENT_AMBULATORY_CARE_PROVIDER_SITE_OTHER): Payer: PRIVATE HEALTH INSURANCE | Admitting: Podiatry

## 2014-11-04 VITALS — BP 109/66 | HR 71 | Resp 16

## 2014-11-04 DIAGNOSIS — M722 Plantar fascial fibromatosis: Secondary | ICD-10-CM

## 2014-11-04 MED ORDER — TRIAMCINOLONE ACETONIDE 10 MG/ML IJ SUSP
10.0000 mg | Freq: Once | INTRAMUSCULAR | Status: AC
  Administered 2014-11-04: 10 mg

## 2014-11-04 NOTE — Progress Notes (Signed)
Subjective:     Patient ID: Stacy Herrera, female   DOB: 04-08-1945, 69 y.o.   MRN: [id number]  HPI patient presents with pain in the left heel at the insertion of the tendon into the calcaneus with fluid buildup noted. Patient states that it has gradually become more bothersome and especially when she gets up in the morning   Review of Systems     Objective:   Physical Exam Neurovascular status intact with discomfort still noted plantar aspect left heel insertional point tendon the calcaneus with moderate depression of the arch noted upon weightbearing    Assessment:     Acute plantar fasciitis left with problem with stretching and pain when getting up in the morning or after periods of sitting    Plan:     Reinjected the plantar fascia 3 mg Kenalog 5 mg Xylocaine and applied night splint with instructions on usage

## 2014-11-05 DIAGNOSIS — L820 Inflamed seborrheic keratosis: Secondary | ICD-10-CM | POA: Diagnosis not present

## 2014-11-05 DIAGNOSIS — I781 Nevus, non-neoplastic: Secondary | ICD-10-CM | POA: Diagnosis not present

## 2014-11-05 DIAGNOSIS — L570 Actinic keratosis: Secondary | ICD-10-CM | POA: Diagnosis not present

## 2014-11-05 DIAGNOSIS — D225 Melanocytic nevi of trunk: Secondary | ICD-10-CM | POA: Diagnosis not present

## 2014-11-05 DIAGNOSIS — X32XXXD Exposure to sunlight, subsequent encounter: Secondary | ICD-10-CM | POA: Diagnosis not present

## 2014-11-25 ENCOUNTER — Ambulatory Visit (INDEPENDENT_AMBULATORY_CARE_PROVIDER_SITE_OTHER): Payer: PRIVATE HEALTH INSURANCE | Admitting: Podiatry

## 2014-11-25 ENCOUNTER — Encounter: Payer: Self-pay | Admitting: Podiatry

## 2014-11-25 DIAGNOSIS — M722 Plantar fascial fibromatosis: Secondary | ICD-10-CM | POA: Diagnosis not present

## 2014-11-25 NOTE — Progress Notes (Signed)
Subjective:     Patient ID: Stacy Herrera, female   DOB: [birth year], 69 y.o.   MRN: [id number]  HPI patient states my heel is still bothering me but some improved from previous visit   Review of Systems     Objective:   Physical Exam Neurovascular status intact with continued discomfort in the plantar fascial left at the insertional point tendon into the calcaneus with inflammation fluid still noted    Assessment:     Plantar fasciitis left still present but improved from previous    Plan:     Advised on physical therapy and by inflammatory's and supportive shoe gear usage. At this time oriented discharge and less symptoms were to worsen

## 2015-01-11 ENCOUNTER — Other Ambulatory Visit: Payer: Self-pay | Admitting: Obstetrics & Gynecology

## 2015-01-11 DIAGNOSIS — Z1231 Encounter for screening mammogram for malignant neoplasm of breast: Secondary | ICD-10-CM

## 2015-02-18 ENCOUNTER — Other Ambulatory Visit: Payer: Self-pay | Admitting: Obstetrics & Gynecology

## 2015-02-18 ENCOUNTER — Ambulatory Visit (HOSPITAL_COMMUNITY): Payer: PRIVATE HEALTH INSURANCE

## 2015-02-18 ENCOUNTER — Ambulatory Visit (HOSPITAL_COMMUNITY)
Admission: RE | Admit: 2015-02-18 | Discharge: 2015-02-18 | Disposition: A | Discharge status: Home / Self Care | Payer: PRIVATE HEALTH INSURANCE | Source: Ambulatory Visit | Attending: Obstetrics & Gynecology | Admitting: Obstetrics & Gynecology

## 2015-02-18 DIAGNOSIS — Z1231 Encounter for screening mammogram for malignant neoplasm of breast: Secondary | ICD-10-CM | POA: Diagnosis not present

## 2015-03-30 ENCOUNTER — Other Ambulatory Visit: Payer: Self-pay | Admitting: Adult Health

## 2015-04-29 IMAGING — MG MM DIGITAL SCREENING
5 series · 5 of 5 positions shown · non-contrast
Comparison: 02/06/2012, 01/27/2011, 01/24/2010, 12/30/2008

ACR Breast Density Category a: The breast tissue is almost entirely
fatty.

CLINICAL DATA: Screening.

EXAM:
DIGITAL SCREENING BILATERAL MAMMOGRAM WITH CAD

[L CC]
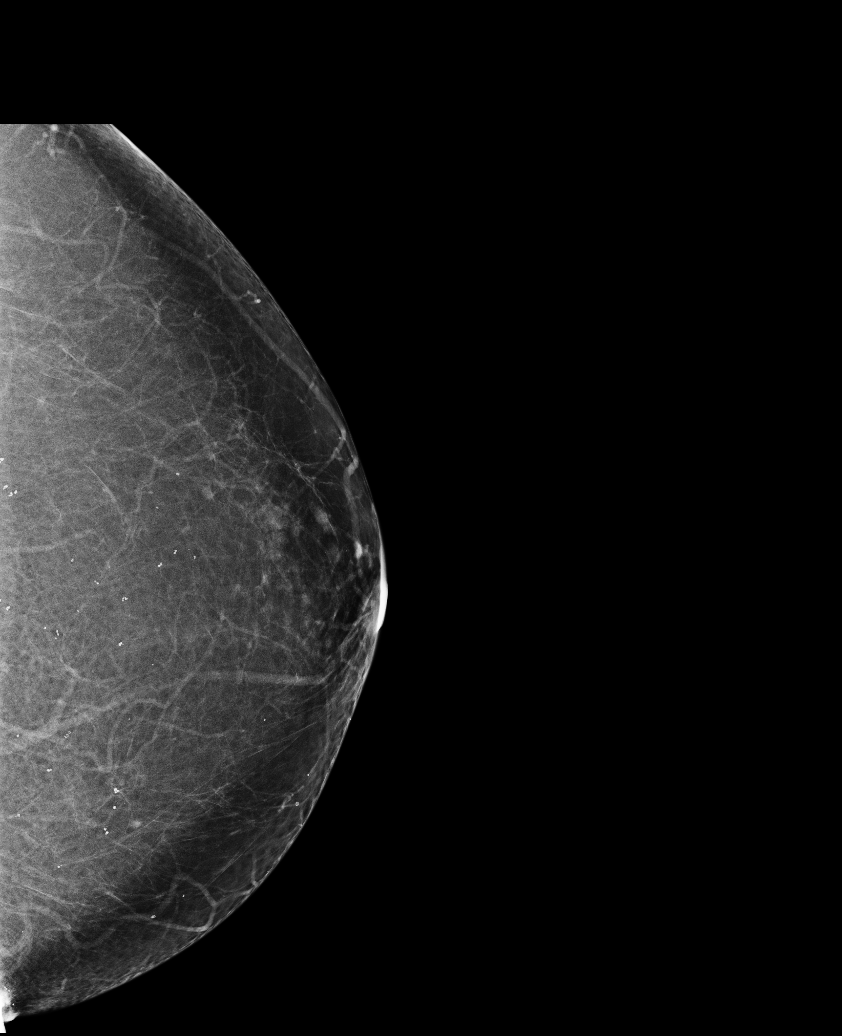

[L MLO (1 of 2)]
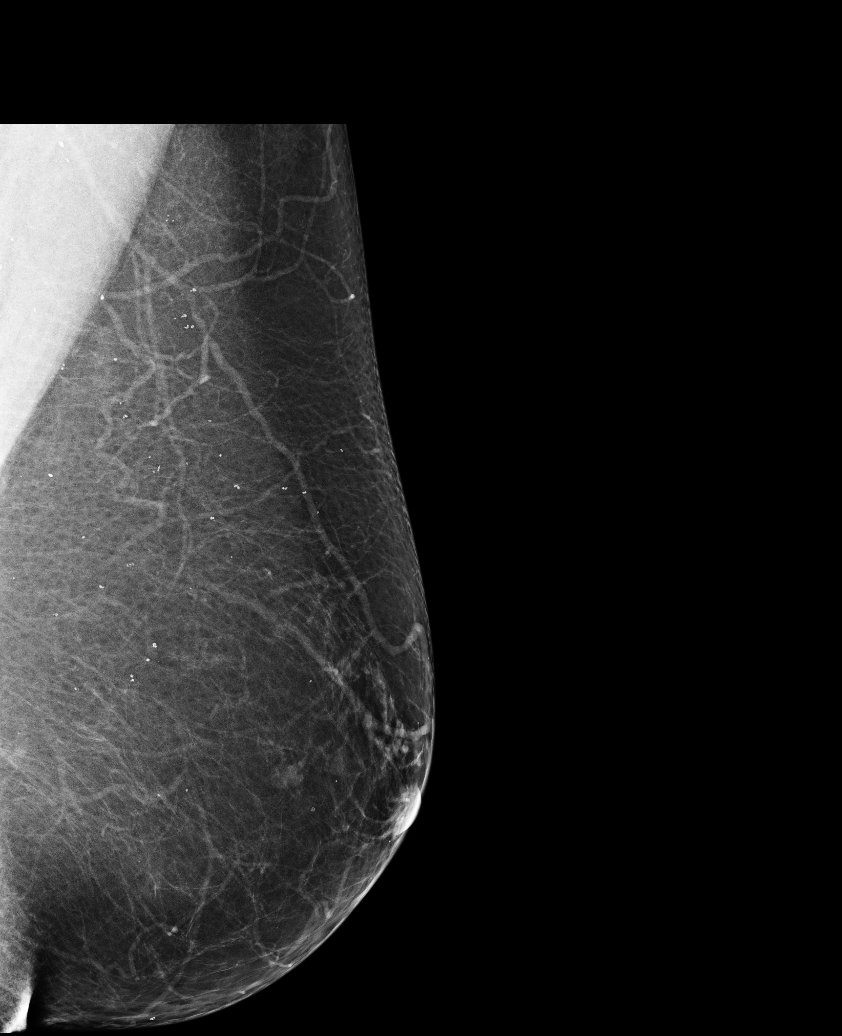

[R CC]
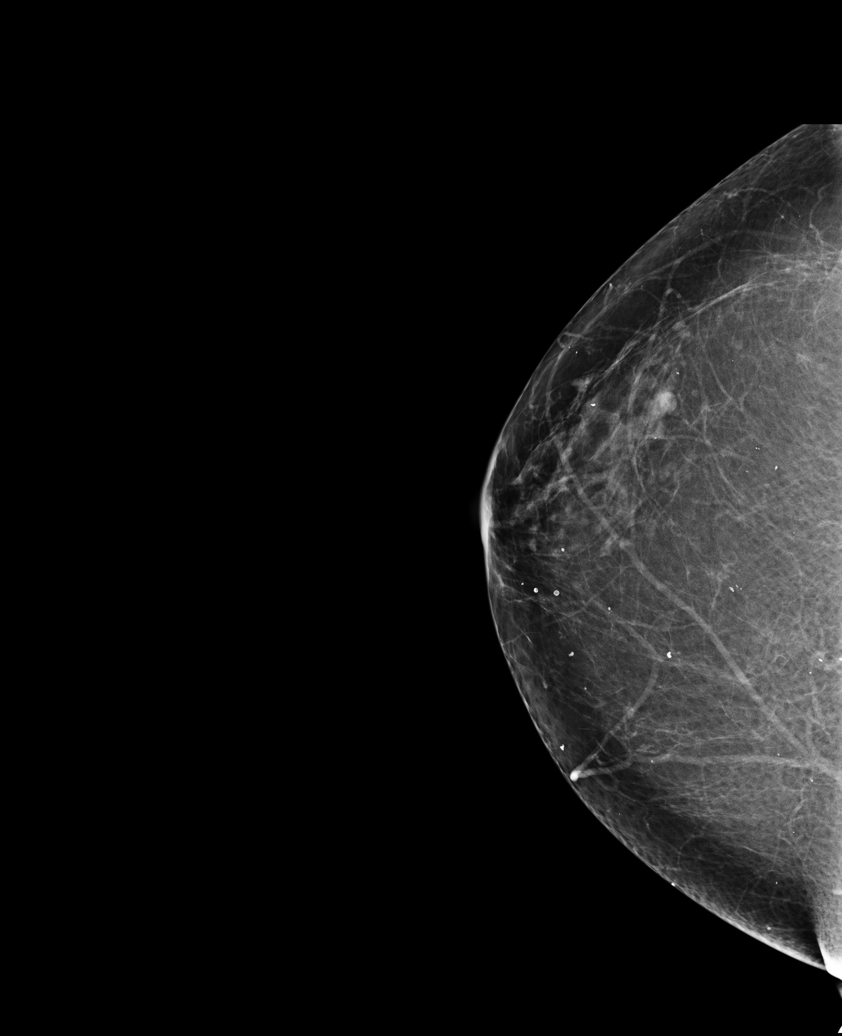

[R MLO]
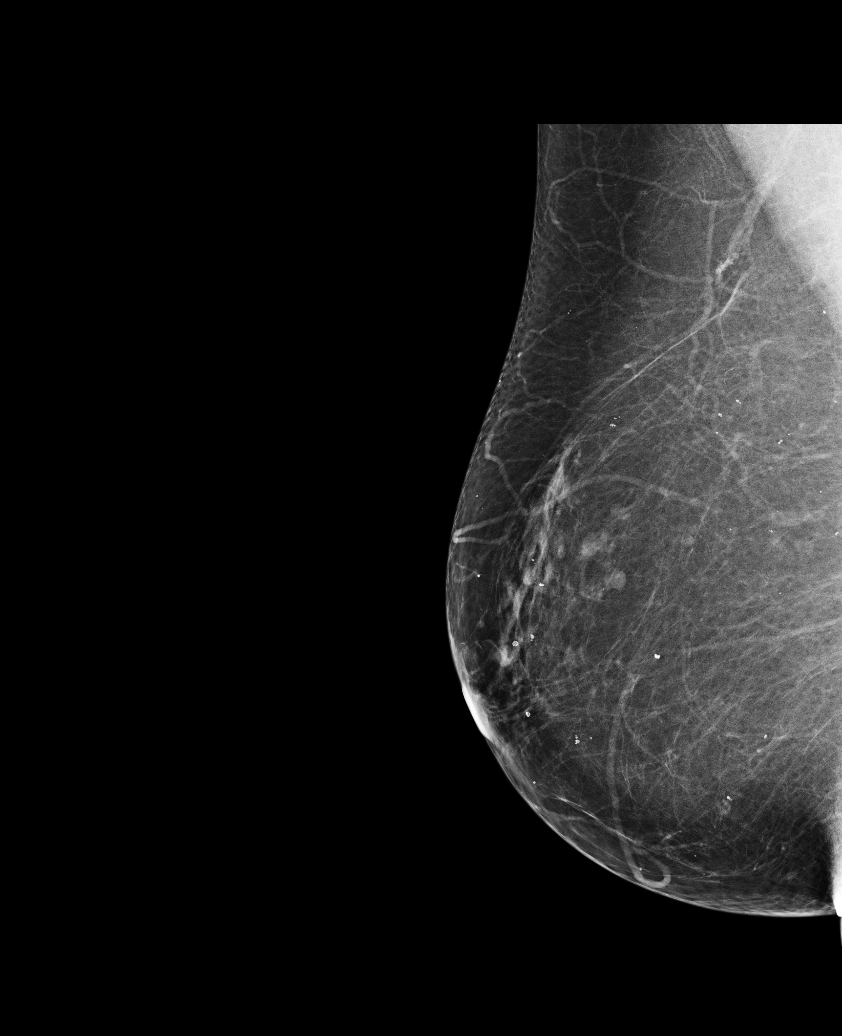

[L MLO (2 of 2)]
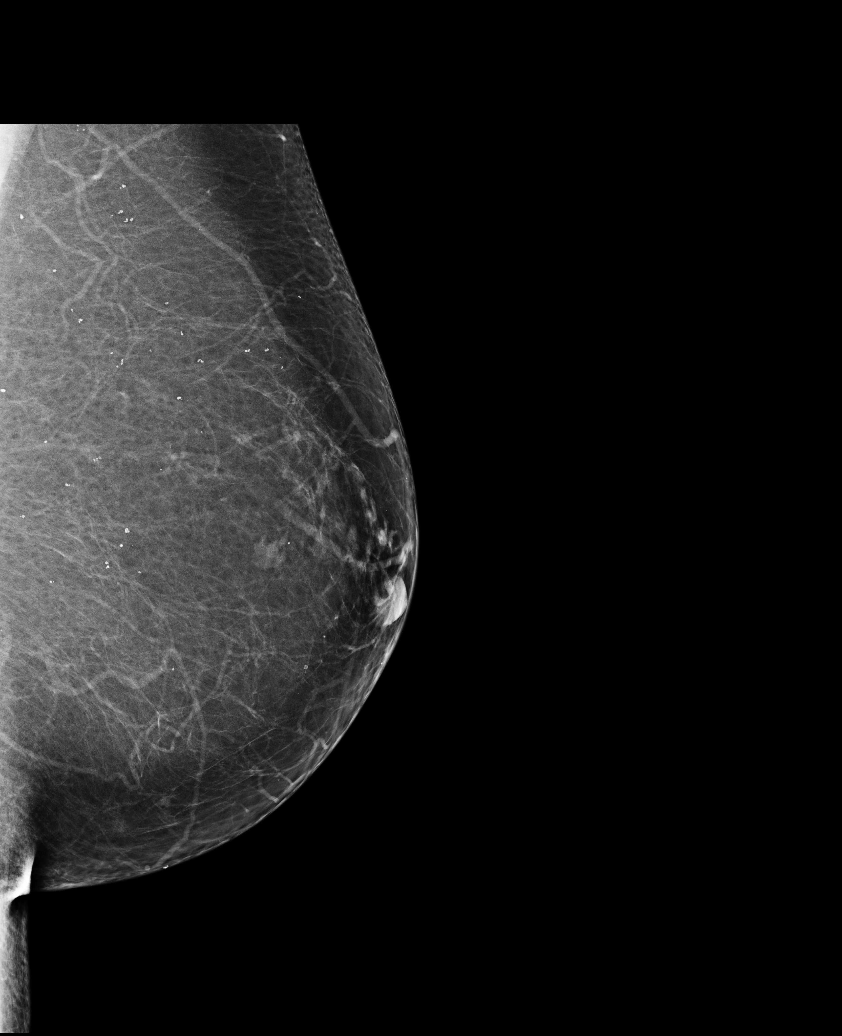

[5 of 5 positions shown; findings below may reference images not displayed]

FINDINGS: There are no findings suspicious for malignancy. Reduction
mammoplasty changes are present. Images were processed with CAD.
IMPRESSION: No mammographic evidence of malignancy. A result letter of this
screening mammogram will be mailed directly to the patient.

RECOMMENDATION:
Screening mammogram in one year. (Code:DG-I-X4N)

BI-RADS CATEGORY  1: Negative

## 2015-05-06 DIAGNOSIS — J209 Acute bronchitis, unspecified: Secondary | ICD-10-CM | POA: Diagnosis not present

## 2015-05-06 DIAGNOSIS — J069 Acute upper respiratory infection, unspecified: Secondary | ICD-10-CM | POA: Diagnosis not present

## 2015-05-06 DIAGNOSIS — Z0001 Encounter for general adult medical examination with abnormal findings: Secondary | ICD-10-CM | POA: Diagnosis not present

## 2015-05-06 DIAGNOSIS — Z6836 Body mass index (BMI) 36.0-36.9, adult: Secondary | ICD-10-CM | POA: Diagnosis not present

## 2015-05-11 DIAGNOSIS — Z1389 Encounter for screening for other disorder: Secondary | ICD-10-CM | POA: Diagnosis not present

## 2015-05-11 DIAGNOSIS — Z6836 Body mass index (BMI) 36.0-36.9, adult: Secondary | ICD-10-CM | POA: Diagnosis not present

## 2015-05-11 DIAGNOSIS — E6609 Other obesity due to excess calories: Secondary | ICD-10-CM | POA: Diagnosis not present

## 2015-05-11 DIAGNOSIS — E782 Mixed hyperlipidemia: Secondary | ICD-10-CM | POA: Diagnosis not present

## 2015-05-11 DIAGNOSIS — R7309 Other abnormal glucose: Secondary | ICD-10-CM | POA: Diagnosis not present

## 2015-05-11 DIAGNOSIS — I10 Essential (primary) hypertension: Secondary | ICD-10-CM | POA: Diagnosis not present

## 2015-05-18 ENCOUNTER — Other Ambulatory Visit: Payer: Self-pay | Admitting: Family Medicine

## 2015-05-20 ENCOUNTER — Other Ambulatory Visit (HOSPITAL_COMMUNITY): Payer: Self-pay | Admitting: Family Medicine

## 2015-06-02 ENCOUNTER — Other Ambulatory Visit: Payer: Self-pay | Admitting: Obstetrics & Gynecology

## 2015-06-17 ENCOUNTER — Other Ambulatory Visit: Payer: Self-pay | Admitting: Adult Health

## 2015-07-19 IMAGING — CT CT ABDOMEN W/ CM
2 of 4 series · 16 of 46 positions shown, 18 images · IV contrast (Omnipaque 300)
Comparison: CT Abdomen and Pelvis 10/18/2005. Lumbar MRI
03/15/2012.

CLINICAL DATA: 67-year-old female with right upper quadrant
abdominal pain. Initial encounter.

EXAM:
CT ABDOMEN WITH CONTRAST
TECHNIQUE: Multidetector CT imaging of the abdomen was performed using the
standard protocol following bolus administration of intravenous
contrast.
CONTRAST:  100mL OMNIPAQUE IOHEXOL 300 MG/ML  SOLN

[Series 2: abd_pel_with 5.0 b40f · axial · 0.73mm/px · z∈[-454,-190]mm · 13 of 61 slices shown, 15 images]
[im 4/61  soft-tissue]
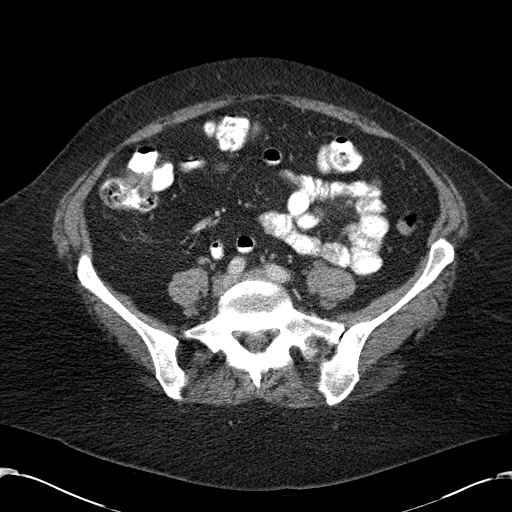
[im 4/61  bone]
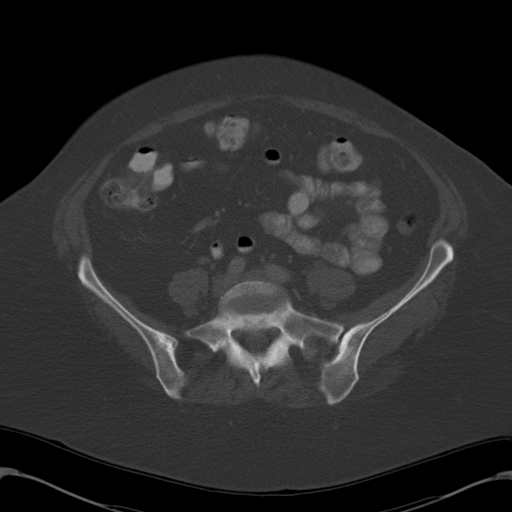
[im 8/61  soft-tissue]
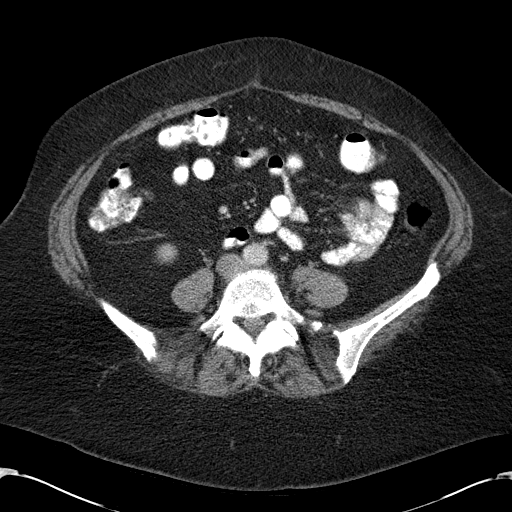
[im 15/61  soft-tissue]
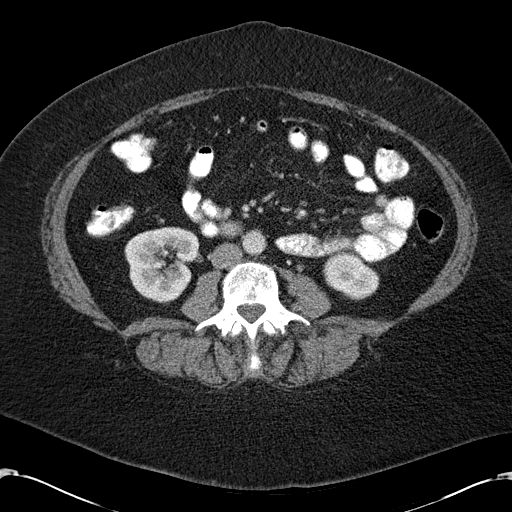
[im 18/61  soft-tissue]
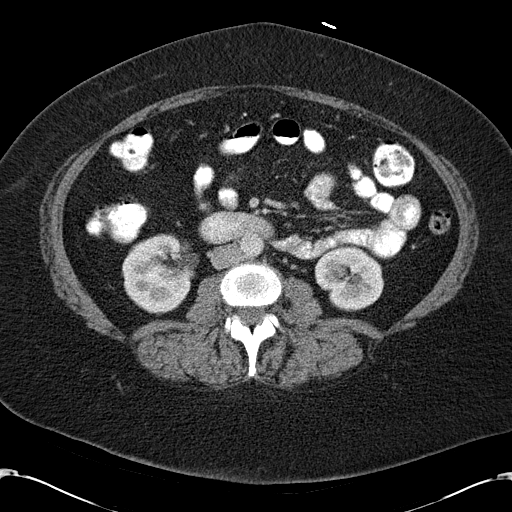
[im 22/61  soft-tissue]
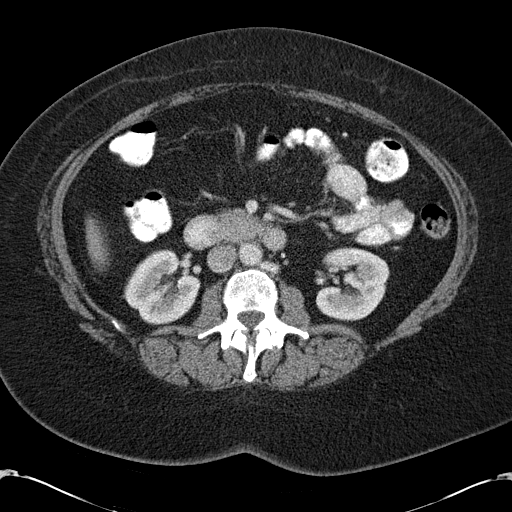
[im 25/61  soft-tissue]
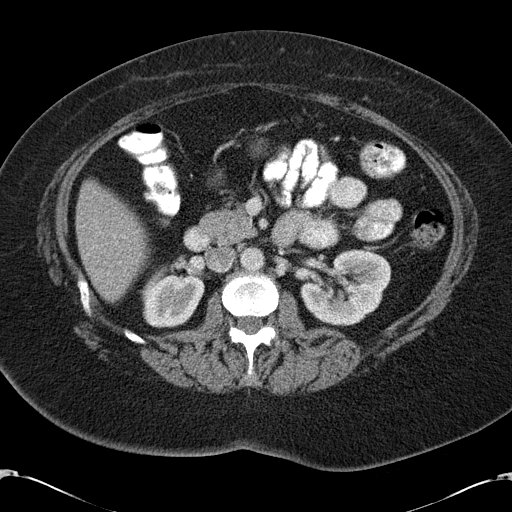
[im 32/61  soft-tissue]
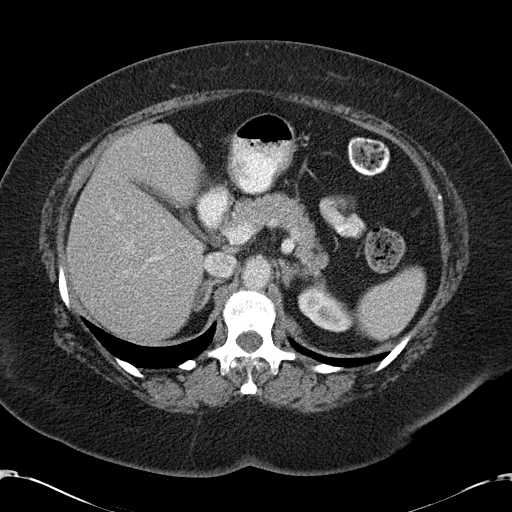
[im 36/61  soft-tissue]
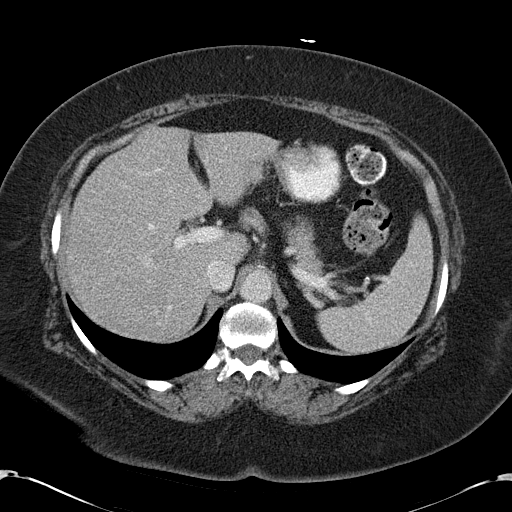
[im 39/61  soft-tissue]
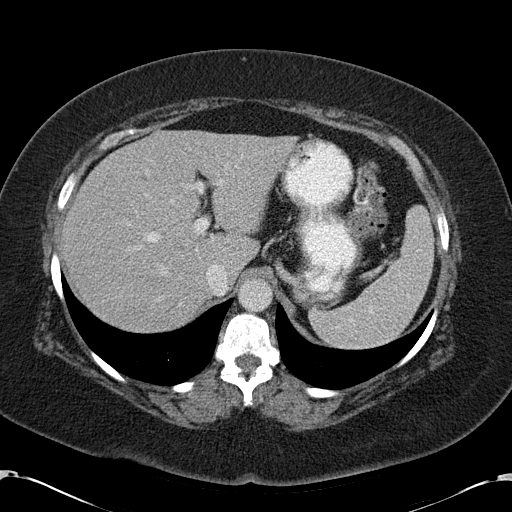
[im 39/61  bone]
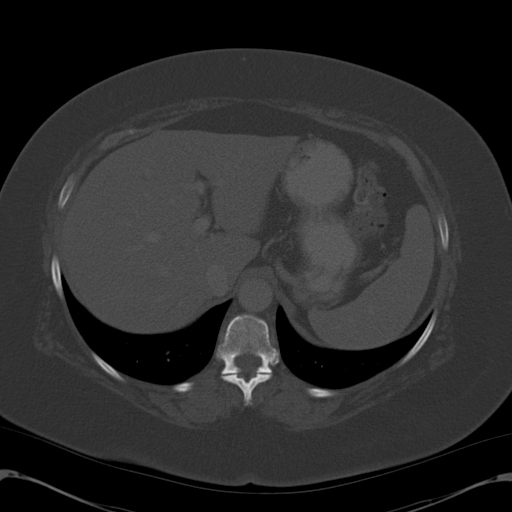
[im 43/61  soft-tissue]
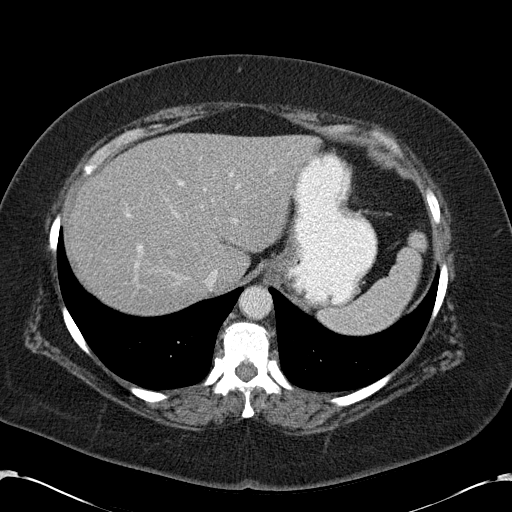
[im 46/61  soft-tissue]
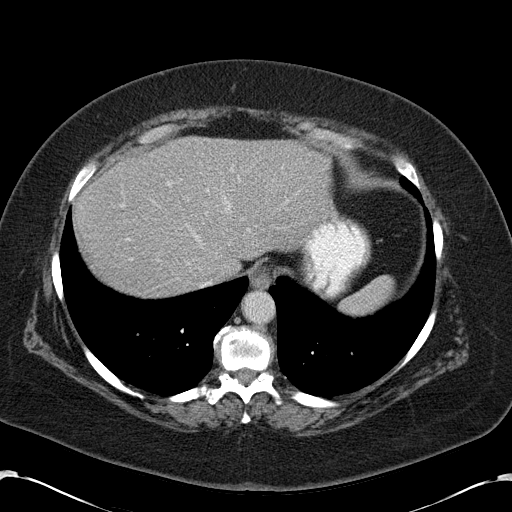
[im 53/61  soft-tissue]
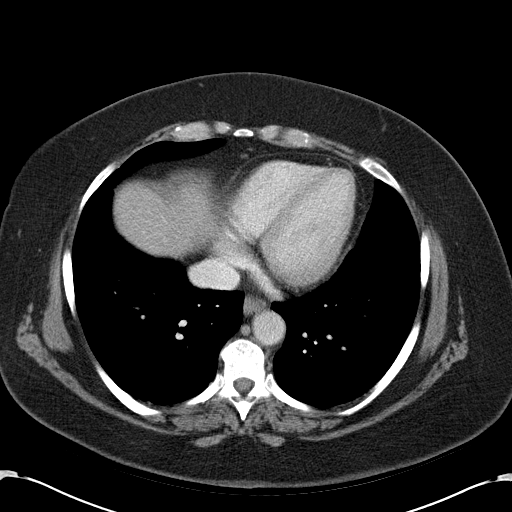
[im 57/61  soft-tissue]
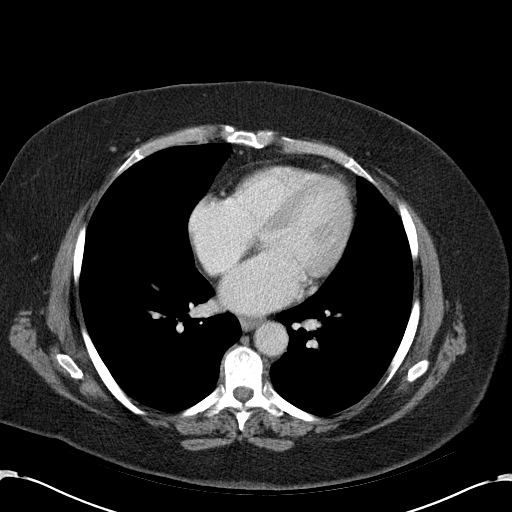

[Series 4: abd_pel_with 3.0 spo cor · coronal · 0.61mm/px · 3 of 97 slices shown]
[im 33/97  soft-tissue]
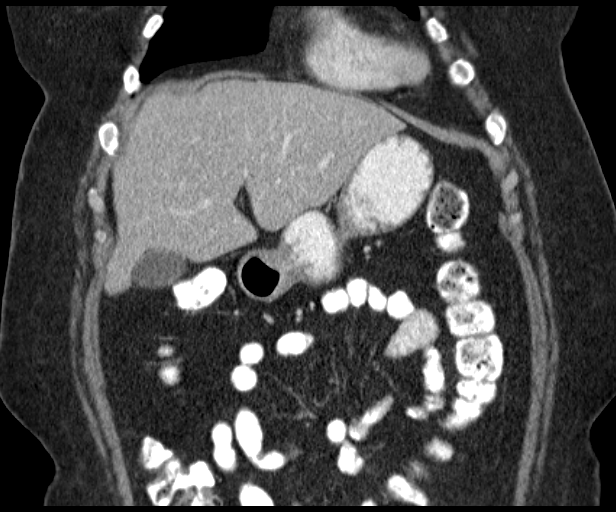
[im 43/97  soft-tissue]
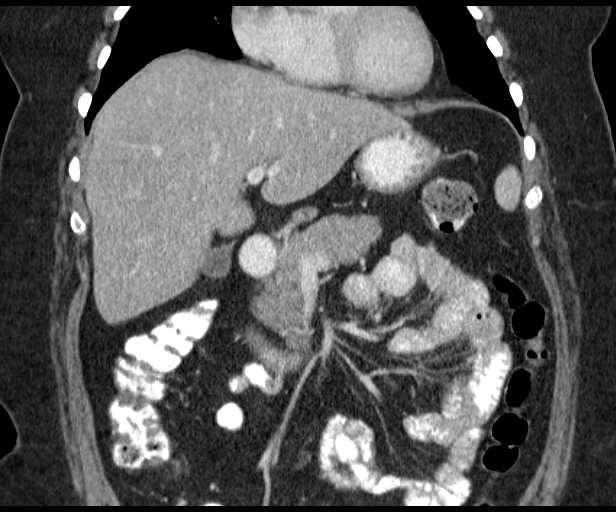
[im 54/97  soft-tissue]
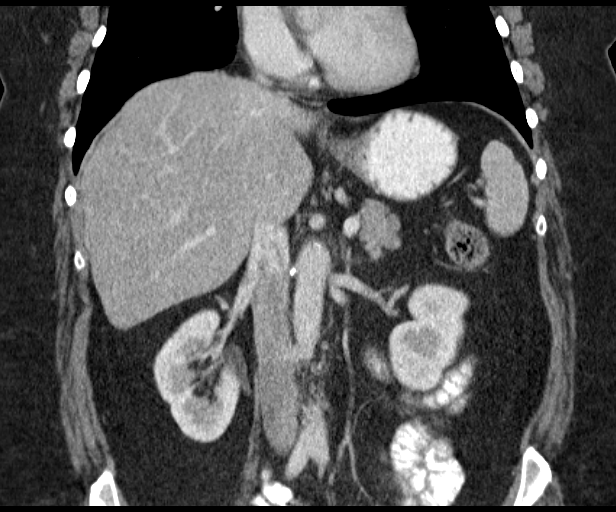

[16 of 46 positions shown; findings below may reference images not displayed]

FINDINGS: No pericardial or pleural effusion.  Negative lung bases.

No acute osseous abnormality identified. Chronic grade 1
spondylolisthesis at L4-L5 associated with advanced facet
arthropathy (bilateral vacuum facet phenomena), and advanced disc
degeneration with vacuum disc phenomena.

Visible sacrum and pelvis appear stable.

Negative visible left colon. Oral contrast has reached the splenic
flexure. Negative visible transverse colon. Negative hepatic flexure
and visible right colon. Ileocecal valve partially visualized. No
dilated small bowel. Negative stomach and duodenum.

No abdominal free fluid. Mildly decreased density throughout the
liver. Otherwise negative liver parenchyma. Gallbladder, spleen,
pancreas, adrenal glands, kidneys, visualized proximally ureters,
and visualized portal venous system appear normal. Visualized
abdominal aorta and major arterial structures are patent. No
inflammatory stranding identified. No lymphadenopathy. No
pneumoperitoneum.

Just to the right of the emboli kiss there is a small 2 cm
subcutaneous fat lipoma or tiny fact containing hernia (possible
defect on series 2, image 52). There is no associated inflammation.
Otherwise negative ventral abdominal wall. Otherwise negative
umbilicus.
IMPRESSION: 1. Small/subtle 2 cm right para umbilical simple fact containing
hernia or less likely lipoma. No evidence of incarceration or
inflammation.
2. Otherwise negative with no acute or inflammatory process
identified in the abdomen. Negative CT appearance of the
gallbladder.

## 2015-07-20 ENCOUNTER — Other Ambulatory Visit: Payer: Self-pay | Admitting: Adult Health

## 2015-08-22 ENCOUNTER — Other Ambulatory Visit: Payer: Self-pay | Admitting: Adult Health

## 2015-09-27 ENCOUNTER — Other Ambulatory Visit: Payer: Self-pay | Admitting: Adult Health

## 2015-09-27 DIAGNOSIS — M9901 Segmental and somatic dysfunction of cervical region: Secondary | ICD-10-CM | POA: Diagnosis not present

## 2015-09-27 DIAGNOSIS — M5033 Other cervical disc degeneration, cervicothoracic region: Secondary | ICD-10-CM | POA: Diagnosis not present

## 2015-09-27 NOTE — Telephone Encounter (Signed)
Tell pt she needs appt for more refills

## 2015-09-28 DIAGNOSIS — M9901 Segmental and somatic dysfunction of cervical region: Secondary | ICD-10-CM | POA: Diagnosis not present

## 2015-09-28 DIAGNOSIS — M5033 Other cervical disc degeneration, cervicothoracic region: Secondary | ICD-10-CM | POA: Diagnosis not present

## 2015-09-29 DIAGNOSIS — M5033 Other cervical disc degeneration, cervicothoracic region: Secondary | ICD-10-CM | POA: Diagnosis not present

## 2015-09-29 DIAGNOSIS — M9901 Segmental and somatic dysfunction of cervical region: Secondary | ICD-10-CM | POA: Diagnosis not present

## 2015-10-04 DIAGNOSIS — M9901 Segmental and somatic dysfunction of cervical region: Secondary | ICD-10-CM | POA: Diagnosis not present

## 2015-10-04 DIAGNOSIS — M5033 Other cervical disc degeneration, cervicothoracic region: Secondary | ICD-10-CM | POA: Diagnosis not present

## 2015-10-06 DIAGNOSIS — M5033 Other cervical disc degeneration, cervicothoracic region: Secondary | ICD-10-CM | POA: Diagnosis not present

## 2015-10-06 DIAGNOSIS — M9901 Segmental and somatic dysfunction of cervical region: Secondary | ICD-10-CM | POA: Diagnosis not present

## 2015-10-07 DIAGNOSIS — M9901 Segmental and somatic dysfunction of cervical region: Secondary | ICD-10-CM | POA: Diagnosis not present

## 2015-10-07 DIAGNOSIS — M5033 Other cervical disc degeneration, cervicothoracic region: Secondary | ICD-10-CM | POA: Diagnosis not present

## 2015-10-11 DIAGNOSIS — M9901 Segmental and somatic dysfunction of cervical region: Secondary | ICD-10-CM | POA: Diagnosis not present

## 2015-10-11 DIAGNOSIS — M5033 Other cervical disc degeneration, cervicothoracic region: Secondary | ICD-10-CM | POA: Diagnosis not present

## 2015-10-12 DIAGNOSIS — M5033 Other cervical disc degeneration, cervicothoracic region: Secondary | ICD-10-CM | POA: Diagnosis not present

## 2015-10-12 DIAGNOSIS — M9901 Segmental and somatic dysfunction of cervical region: Secondary | ICD-10-CM | POA: Diagnosis not present

## 2015-10-14 DIAGNOSIS — M5033 Other cervical disc degeneration, cervicothoracic region: Secondary | ICD-10-CM | POA: Diagnosis not present

## 2015-10-14 DIAGNOSIS — M9901 Segmental and somatic dysfunction of cervical region: Secondary | ICD-10-CM | POA: Diagnosis not present

## 2015-10-18 DIAGNOSIS — M5033 Other cervical disc degeneration, cervicothoracic region: Secondary | ICD-10-CM | POA: Diagnosis not present

## 2015-10-18 DIAGNOSIS — M9901 Segmental and somatic dysfunction of cervical region: Secondary | ICD-10-CM | POA: Diagnosis not present

## 2015-10-19 DIAGNOSIS — M9901 Segmental and somatic dysfunction of cervical region: Secondary | ICD-10-CM | POA: Diagnosis not present

## 2015-10-19 DIAGNOSIS — M5033 Other cervical disc degeneration, cervicothoracic region: Secondary | ICD-10-CM | POA: Diagnosis not present

## 2015-10-27 ENCOUNTER — Other Ambulatory Visit: Payer: Self-pay | Admitting: Adult Health

## 2015-11-05 ENCOUNTER — Telehealth: Payer: Self-pay | Admitting: Obstetrics & Gynecology

## 2015-11-05 MED ORDER — ESTRADIOL 0.5 MG PO TABS: 0.5000 mg | ORAL_TABLET | Freq: Every day | ORAL | 0 refills | Status: DC

## 2015-11-05 NOTE — Telephone Encounter (Signed)
Pt aware estrace rx sent, has appt next week

## 2015-11-05 NOTE — Telephone Encounter (Signed)
Pt requesting refill on Estradiol, states she does have an appt with Derrek Monaco, NP on 11/11/2015. Requesting a refill due to being out of med for several days and now having hot flashes. Please advise.

## 2015-11-11 ENCOUNTER — Ambulatory Visit (INDEPENDENT_AMBULATORY_CARE_PROVIDER_SITE_OTHER): Payer: PRIVATE HEALTH INSURANCE | Admitting: Adult Health

## 2015-11-11 ENCOUNTER — Ambulatory Visit: Payer: PRIVATE HEALTH INSURANCE | Admitting: Obstetrics & Gynecology

## 2015-11-11 ENCOUNTER — Encounter: Payer: Self-pay | Admitting: Adult Health

## 2015-11-11 VITALS — BP 130/78 | HR 80 | Ht 66.0 in | Wt 228.0 lb

## 2015-11-11 DIAGNOSIS — Z5181 Encounter for therapeutic drug level monitoring: Secondary | ICD-10-CM | POA: Diagnosis not present

## 2015-11-11 DIAGNOSIS — Z79890 Hormone replacement therapy (postmenopausal): Principal | ICD-10-CM

## 2015-11-11 DIAGNOSIS — N951 Menopausal and female climacteric states: Secondary | ICD-10-CM | POA: Diagnosis not present

## 2015-11-11 MED ORDER — ESTRADIOL 0.5 MG PO TABS
0.5000 mg | ORAL_TABLET | Freq: Every day | ORAL | 12 refills | Status: DC
Start: 2015-11-11 — End: 2016-12-18

## 2015-11-11 NOTE — Progress Notes (Signed)
Subjective:     Patient ID: Stacy Herrera, female   DOB: 09/08/1945, 70 y.o.   MRN: [id number]  HPI Stacy Herrera is a 70 year old white female in for refills on estrace, she was out for a few weeks and had hot flashes, she wants it refilled.  Review of Systems Patient denies any headaches, hearing loss, fatigue, blurred vision, shortness of breath, chest pain, abdominal pain, problems with bowel movements, urination, or intercourse(not having sex). No joint pain or mood swings. +hot flashes when off estrace Reviewed past medical,surgical, social and family history. Reviewed medications and allergies.     Objective:   Physical Exam BP 130/78   Pulse 80   Ht 5\' 6"  (1.676 m)   Wt 228 lb (103.4 kg)   BMI 36.80 kg/m  Skin warm and dry. Neck: mid line trachea, normal thyroid, good ROM, no lymphadenopathy noted. Lungs: clear to ausculation bilaterally. Cardiovascular: regular rate and rhythm.No carotid bruits heard.    Assessment:     1. Encounter for monitoring postmenopausal estrogen replacement therapy       Plan:     Refilled estrace 0.5 mg #30 take 1 daily with 11 refills Follow up in 1 year Labs with PCP or work  Physical with Dr Hilma Favors  Mammogram yearly

## 2015-11-11 NOTE — Patient Instructions (Addendum)
Continue estrace Follow up in 1 year Labs with PCP or work  Physical with Dr Hilma Favors  Mammogram yearly

## 2015-11-29 ENCOUNTER — Other Ambulatory Visit (HOSPITAL_COMMUNITY): Payer: Self-pay | Admitting: Family Medicine

## 2015-11-29 DIAGNOSIS — Z1389 Encounter for screening for other disorder: Secondary | ICD-10-CM

## 2015-12-02 DIAGNOSIS — X32XXXD Exposure to sunlight, subsequent encounter: Secondary | ICD-10-CM | POA: Diagnosis not present

## 2015-12-02 DIAGNOSIS — L820 Inflamed seborrheic keratosis: Secondary | ICD-10-CM | POA: Diagnosis not present

## 2015-12-02 DIAGNOSIS — C44519 Basal cell carcinoma of skin of other part of trunk: Secondary | ICD-10-CM | POA: Diagnosis not present

## 2015-12-02 DIAGNOSIS — L821 Other seborrheic keratosis: Secondary | ICD-10-CM | POA: Diagnosis not present

## 2015-12-02 DIAGNOSIS — L570 Actinic keratosis: Secondary | ICD-10-CM | POA: Diagnosis not present

## 2015-12-02 DIAGNOSIS — D225 Melanocytic nevi of trunk: Secondary | ICD-10-CM | POA: Diagnosis not present

## 2015-12-02 DIAGNOSIS — D0361 Melanoma in situ of right upper limb, including shoulder: Secondary | ICD-10-CM | POA: Diagnosis not present

## 2015-12-16 ENCOUNTER — Inpatient Hospital Stay (HOSPITAL_COMMUNITY): Admission: RE | Admit: 2015-12-16 | Payer: PRIVATE HEALTH INSURANCE | Source: Ambulatory Visit

## 2015-12-16 DIAGNOSIS — D0361 Melanoma in situ of right upper limb, including shoulder: Secondary | ICD-10-CM | POA: Diagnosis not present

## 2015-12-16 DIAGNOSIS — L988 Other specified disorders of the skin and subcutaneous tissue: Secondary | ICD-10-CM | POA: Diagnosis not present

## 2016-01-27 DIAGNOSIS — R070 Pain in throat: Secondary | ICD-10-CM | POA: Diagnosis not present

## 2016-01-27 DIAGNOSIS — J343 Hypertrophy of nasal turbinates: Secondary | ICD-10-CM | POA: Diagnosis not present

## 2016-01-27 DIAGNOSIS — J209 Acute bronchitis, unspecified: Secondary | ICD-10-CM | POA: Diagnosis not present

## 2016-01-27 DIAGNOSIS — I10 Essential (primary) hypertension: Secondary | ICD-10-CM | POA: Diagnosis not present

## 2016-01-27 DIAGNOSIS — Z1389 Encounter for screening for other disorder: Secondary | ICD-10-CM | POA: Diagnosis not present

## 2016-01-27 DIAGNOSIS — E782 Mixed hyperlipidemia: Secondary | ICD-10-CM | POA: Diagnosis not present

## 2016-01-27 DIAGNOSIS — Z6837 Body mass index (BMI) 37.0-37.9, adult: Secondary | ICD-10-CM | POA: Diagnosis not present

## 2016-01-27 DIAGNOSIS — E669 Obesity, unspecified: Secondary | ICD-10-CM | POA: Diagnosis not present

## 2016-01-28 ENCOUNTER — Emergency Department (HOSPITAL_COMMUNITY)
Admission: EM | Admit: 2016-01-28 | Discharge: 2016-01-28 | Disposition: A | Discharge status: Home / Self Care | Payer: PRIVATE HEALTH INSURANCE | Attending: Emergency Medicine | Admitting: Emergency Medicine

## 2016-01-28 ENCOUNTER — Encounter (HOSPITAL_COMMUNITY): Payer: Self-pay | Admitting: *Deleted

## 2016-01-28 ENCOUNTER — Emergency Department (HOSPITAL_COMMUNITY): Payer: PRIVATE HEALTH INSURANCE

## 2016-01-28 DIAGNOSIS — R0981 Nasal congestion: Secondary | ICD-10-CM | POA: Insufficient documentation

## 2016-01-28 DIAGNOSIS — Y999 Unspecified external cause status: Secondary | ICD-10-CM | POA: Insufficient documentation

## 2016-01-28 DIAGNOSIS — R05 Cough: Secondary | ICD-10-CM | POA: Diagnosis not present

## 2016-01-28 DIAGNOSIS — X58XXXA Exposure to other specified factors, initial encounter: Secondary | ICD-10-CM | POA: Insufficient documentation

## 2016-01-28 DIAGNOSIS — Y9389 Activity, other specified: Secondary | ICD-10-CM | POA: Diagnosis not present

## 2016-01-28 DIAGNOSIS — S8992XA Unspecified injury of left lower leg, initial encounter: Secondary | ICD-10-CM | POA: Diagnosis present

## 2016-01-28 DIAGNOSIS — M1712 Unilateral primary osteoarthritis, left knee: Secondary | ICD-10-CM | POA: Diagnosis not present

## 2016-01-28 DIAGNOSIS — Y929 Unspecified place or not applicable: Secondary | ICD-10-CM | POA: Diagnosis not present

## 2016-01-28 DIAGNOSIS — Z87891 Personal history of nicotine dependence: Secondary | ICD-10-CM | POA: Insufficient documentation

## 2016-01-28 MED ORDER — KETOROLAC TROMETHAMINE 60 MG/2ML IM SOLN
30.0000 mg | Freq: Once | INTRAMUSCULAR | Status: AC
  Administered 2016-01-28: 30 mg via INTRAMUSCULAR
  Filled 2016-01-28: qty 2

## 2016-01-28 MED ORDER — DEXAMETHASONE 4 MG PO TABS: 4.0000 mg | ORAL_TABLET | Freq: Two times a day (BID) | ORAL | 0 refills | Status: DC

## 2016-01-28 NOTE — Discharge Instructions (Signed)
Your x-ray suggests arthritis involving your left knee. Your examination suggest inflammation causing pain, redness, and increased warmth. Please use a heating pad to your knee. Please use Decadron 2 times daily with food. Please see your doctor for orthopedic consultation if not improving.

## 2016-01-28 NOTE — ED Provider Notes (Signed)
[facility] DEPT Provider Note   CSN: [id number] Arrival date & time: 01/28/16  1611     History   Chief Complaint Chief Complaint  Patient presents with  . Knee Injury    HPI Stacy Herrera is a 70 y.o. female.  The history is provided by the patient.  Knee Pain   This is a new problem. The current episode started more than 1 week ago. The problem occurs daily. The problem has been gradually worsening. The pain is present in the left knee. The quality of the pain is described as aching. The pain is moderate. Associated symptoms include limited range of motion. The symptoms are aggravated by activity and standing. She has tried nothing for the symptoms. There has been no history of extremity trauma.    Past Medical History:  Diagnosis Date  . Encounter for monitoring postmenopausal estrogen replacement therapy 03/24/2013  . High cholesterol   . OAB (overactive bladder)   . UI (urinary incontinence) 03/24/2013  . Umbilical hernia   . Urgency incontinence   . UTI (lower urinary tract infection) 07/25/2013    Patient Active Problem List   Diagnosis Date Noted  . UTI (lower urinary tract infection) 07/25/2013  . Encounter for monitoring postmenopausal estrogen replacement therapy 03/24/2013  . UI (urinary incontinence) 03/24/2013  . OAB (overactive bladder) 03/24/2013  . Urinary tract infection, site not specified 05/30/2012    Past Surgical History:  Procedure Laterality Date  . ABDOMINAL HYSTERECTOMY    . bladder mesh    . BREAST REDUCTION SURGERY    . BREAST SURGERY     reduction  . CYSTOCELE REPAIR     cystocele and rectocele  . KNEE SURGERY    . NASAL SINUS SURGERY    . ROTATOR CUFF REPAIR    . TOE SURGERY    . TONSILLECTOMY      OB History    Gravida Para Term Preterm AB Living   2 2       2    SAB TAB Ectopic Multiple Live Births                   Home Medications    Prior to Admission medications   Medication Sig Start Date End Date  Taking? Authorizing Provider  estradiol (ESTRACE) 0.5 MG tablet Take 1 tablet (0.5 mg total) by mouth daily. 11/11/15   Estill Dooms, NP  fish oil-omega-3 fatty acids 1000 MG capsule Take 2 g by mouth daily.    Historical Provider, MD  glucosamine-chondroitin 500-400 MG tablet Take 2 tablets by mouth at bedtime.    Historical Provider, MD  Multiple Vitamin (MULITIVITAMIN WITH MINERALS) TABS Take 1 tablet by mouth at bedtime.    Historical Provider, MD  simvastatin (ZOCOR) 20 MG tablet TAKE 1 TABLET BY MOUTH EVERY EVENING FOR CHOLESTEROL 06/02/15   Florian Buff, MD  VITAMIN E PO Take 2 tablets by mouth at bedtime.    Historical Provider, MD    Family History Family History  Problem Relation Age of Onset  . Diabetes Mother   . Cancer Mother     lung  . Cancer Father     lung  . Cancer Sister     lung  . Lymphoma Sister   . Diabetes Maternal Aunt   . Diabetes Paternal Grandmother   . Early death Paternal Grandmother   . Diabetes Other     Social History Social History  Substance Use Topics  . Smoking status: Former  Smoker    Types: Cigarettes    Quit date: 02/28/1988  . Smokeless tobacco: Never Used  . Alcohol use No     Comment: occ.     Allergies   Benadryl [diphenhydramine hcl] and Penicillins   Review of Systems Review of Systems  Constitutional: Positive for chills. Negative for fever.  HENT: Positive for congestion.   Respiratory: Positive for cough.   Musculoskeletal: Positive for arthralgias.  Skin: Negative for rash.  All other systems reviewed and are negative.    Physical Exam Updated Vital Signs BP 139/69 (BP Location: Left Arm)   Pulse 87   Temp 98.2 F (36.8 C) (Oral)   Resp 20   Ht 5\' 6"  (1.676 m)   Wt 99.8 kg   SpO2 97%   BMI 35.51 kg/m   Physical Exam  Constitutional: She is oriented to person, place, and time. She appears well-developed and well-nourished.  Non-toxic appearance.  HENT:  Head: Normocephalic.  Right Ear: Tympanic  membrane and external ear normal.  Left Ear: Tympanic membrane and external ear normal.  Eyes: EOM and lids are normal. Pupils are equal, round, and reactive to light.  Neck: Normal range of motion. Neck supple. Carotid bruit is not present.  Cardiovascular: Normal rate, regular rhythm, normal heart sounds, intact distal pulses and normal pulses.   Pulmonary/Chest: Breath sounds normal. No respiratory distress.  Abdominal: Soft. Bowel sounds are normal. There is no tenderness. There is no guarding.  Musculoskeletal:  There is increased redness of the medial and lateral anterior aspect of the left knee. There is increased warmth of the knee. There is degenerative changes of the knee. There is crepitus noted of the left knee. There is no deformity of the quadricep area. No deformity of the anterior tibial tuberosity. No posterior mass appreciated.  Lymphadenopathy:       Head (right side): No submandibular adenopathy present.       Head (left side): No submandibular adenopathy present.    She has no cervical adenopathy.  Neurological: She is alert and oriented to person, place, and time. She has normal strength. No cranial nerve deficit or sensory deficit.  Skin: Skin is warm and dry.  Psychiatric: She has a normal mood and affect. Her speech is normal.  Nursing note and vitals reviewed.    ED Treatments / Results  Labs (all labs ordered are listed, but only abnormal results are displayed) Labs Reviewed - No data to display  EKG  EKG Interpretation None       Radiology No results found.  Procedures Procedures (including critical care time)  Medications Ordered in ED Medications - No data to display   Initial Impression / Assessment and Plan / ED Course  I have reviewed the triage vital signs and the nursing notes.  Pertinent labs & imaging results that were available during my care of the patient were reviewed by me and considered in my medical decision making (see chart  for details).  Clinical Course   Case discussed with Dr. Thurnell Garbe.  **I have reviewed nursing notes, vital signs, and all appropriate lab and imaging results for this patient.*  Final Clinical Impressions(s) / ED Diagnoses  Vital signs within normal limits. X-ray of the left knee reveals arthritis changes. There is no fracture or dislocation. There is no effusion, and no gas appreciated. I discussed the x-rays as well as the clinical findings with the patient. Patient treated in the emergency department with intramuscular Toradol. The patient is been previously  given prescription for prednisone. The patient does not like prednisone. We'll try Decadron 2 times daily with food. I've asked the family to see the primary physician for orthopedic consultation if not improving. Patient and family in agreement with this discharge plan.    Final diagnoses:  Primary osteoarthritis of left knee    New Prescriptions New Prescriptions   No medications on file     Lily Kocher, PA-C 01/28/16 [place], DO 01/31/16 2030

## 2016-01-28 NOTE — ED Triage Notes (Signed)
Pt comes in with left knee pain starting on Sunday while she was putting up [employer]. Pt states she has had swelling in her knee since then. Pt has redness to her skin in that area.

## 2016-01-28 NOTE — ED Notes (Signed)
hobson at bedside.

## 2016-01-28 NOTE — ED Notes (Signed)
Pt taken to XR.  

## 2016-01-28 NOTE — ED Notes (Signed)
Pt made aware to return if symptoms worsen or if any life threatening symptoms occur.   

## 2016-02-03 DIAGNOSIS — M25562 Pain in left knee: Secondary | ICD-10-CM | POA: Diagnosis not present

## 2016-02-03 DIAGNOSIS — G8929 Other chronic pain: Secondary | ICD-10-CM | POA: Diagnosis not present

## 2016-02-03 DIAGNOSIS — M1712 Unilateral primary osteoarthritis, left knee: Secondary | ICD-10-CM | POA: Diagnosis not present

## 2016-03-01 ENCOUNTER — Ambulatory Visit (HOSPITAL_COMMUNITY)
Admission: RE | Admit: 2016-03-01 | Discharge: 2016-03-01 | Disposition: A | Discharge status: Home / Self Care | Payer: PRIVATE HEALTH INSURANCE | Source: Ambulatory Visit | Attending: Obstetrics & Gynecology | Admitting: Obstetrics & Gynecology

## 2016-03-01 DIAGNOSIS — Z1231 Encounter for screening mammogram for malignant neoplasm of breast: Secondary | ICD-10-CM | POA: Insufficient documentation

## 2016-03-07 DIAGNOSIS — M1712 Unilateral primary osteoarthritis, left knee: Secondary | ICD-10-CM | POA: Diagnosis not present

## 2016-03-07 DIAGNOSIS — G8929 Other chronic pain: Secondary | ICD-10-CM | POA: Diagnosis not present

## 2016-03-07 DIAGNOSIS — Z9889 Other specified postprocedural states: Secondary | ICD-10-CM | POA: Diagnosis not present

## 2016-03-22 DIAGNOSIS — Z6836 Body mass index (BMI) 36.0-36.9, adult: Secondary | ICD-10-CM | POA: Diagnosis not present

## 2016-03-22 DIAGNOSIS — R7309 Other abnormal glucose: Secondary | ICD-10-CM | POA: Diagnosis not present

## 2016-03-22 DIAGNOSIS — Z23 Encounter for immunization: Secondary | ICD-10-CM | POA: Diagnosis not present

## 2016-03-22 DIAGNOSIS — E782 Mixed hyperlipidemia: Secondary | ICD-10-CM | POA: Diagnosis not present

## 2016-03-22 DIAGNOSIS — E6609 Other obesity due to excess calories: Secondary | ICD-10-CM | POA: Diagnosis not present

## 2016-03-22 DIAGNOSIS — Z01810 Encounter for preprocedural cardiovascular examination: Secondary | ICD-10-CM | POA: Diagnosis not present

## 2016-03-22 DIAGNOSIS — M4690 Unspecified inflammatory spondylopathy, site unspecified: Secondary | ICD-10-CM | POA: Diagnosis not present

## 2016-03-22 DIAGNOSIS — I10 Essential (primary) hypertension: Secondary | ICD-10-CM | POA: Diagnosis not present

## 2016-04-06 DIAGNOSIS — L820 Inflamed seborrheic keratosis: Secondary | ICD-10-CM | POA: Diagnosis not present

## 2016-04-06 DIAGNOSIS — Z08 Encounter for follow-up examination after completed treatment for malignant neoplasm: Secondary | ICD-10-CM | POA: Diagnosis not present

## 2016-04-06 DIAGNOSIS — Z85820 Personal history of malignant melanoma of skin: Secondary | ICD-10-CM | POA: Diagnosis not present

## 2016-04-06 DIAGNOSIS — Z1283 Encounter for screening for malignant neoplasm of skin: Secondary | ICD-10-CM | POA: Diagnosis not present

## 2016-04-10 DIAGNOSIS — M94262 Chondromalacia, left knee: Secondary | ICD-10-CM | POA: Diagnosis not present

## 2016-04-10 DIAGNOSIS — G8918 Other acute postprocedural pain: Secondary | ICD-10-CM | POA: Diagnosis not present

## 2016-04-10 DIAGNOSIS — M1712 Unilateral primary osteoarthritis, left knee: Secondary | ICD-10-CM | POA: Diagnosis not present

## 2016-04-18 DIAGNOSIS — M25562 Pain in left knee: Secondary | ICD-10-CM | POA: Diagnosis not present

## 2016-04-18 DIAGNOSIS — Z96652 Presence of left artificial knee joint: Secondary | ICD-10-CM | POA: Diagnosis not present

## 2016-05-02 IMAGING — MG MM DIGITAL SCREENING
5 series · 5 of 5 positions shown · non-contrast
Comparison: Previous exam(s)

ACR Breast Density Category a: The breast tissue is almost entirely
fatty.

CLINICAL DATA: Screening.

EXAM:
DIGITAL SCREENING BILATERAL MAMMOGRAM WITH CAD

[L CC]
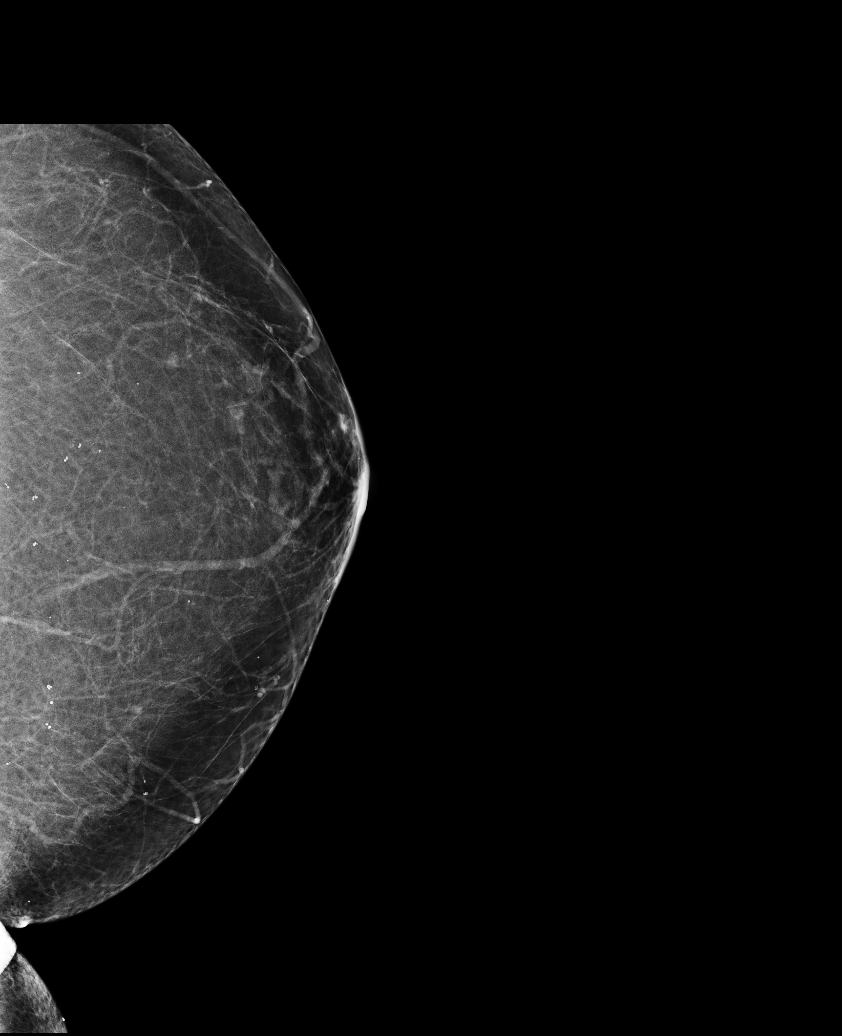

[L MLO]
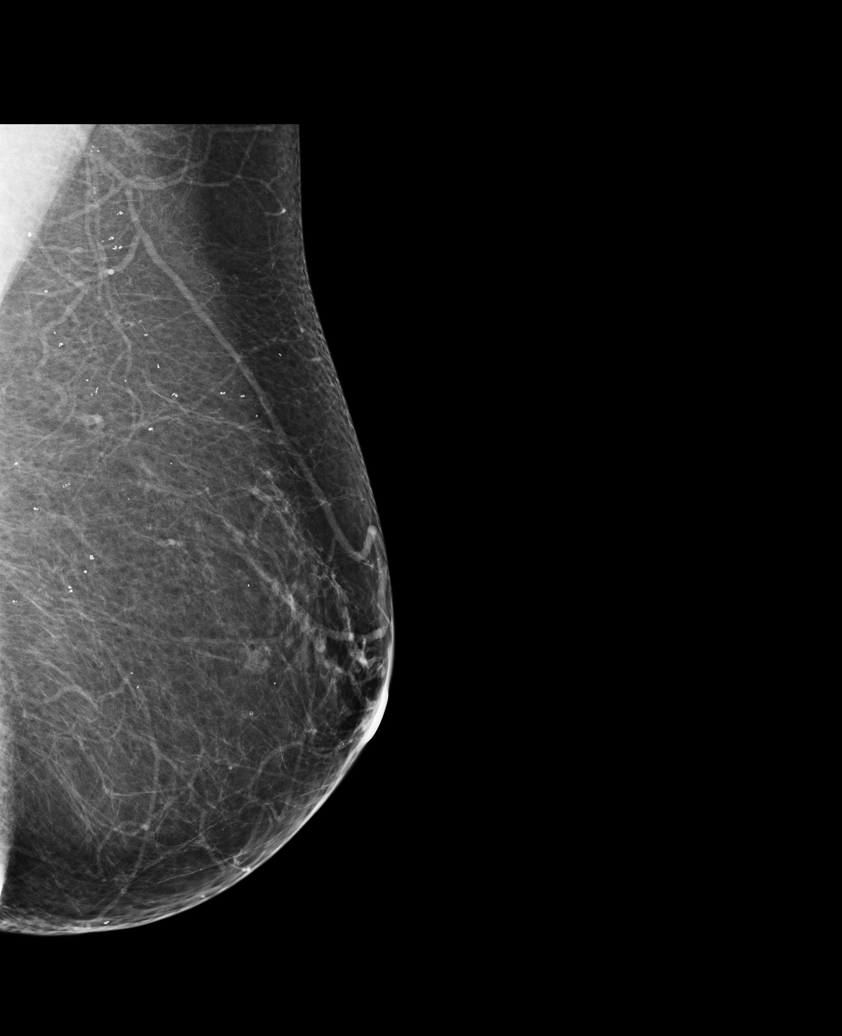

[R CC]
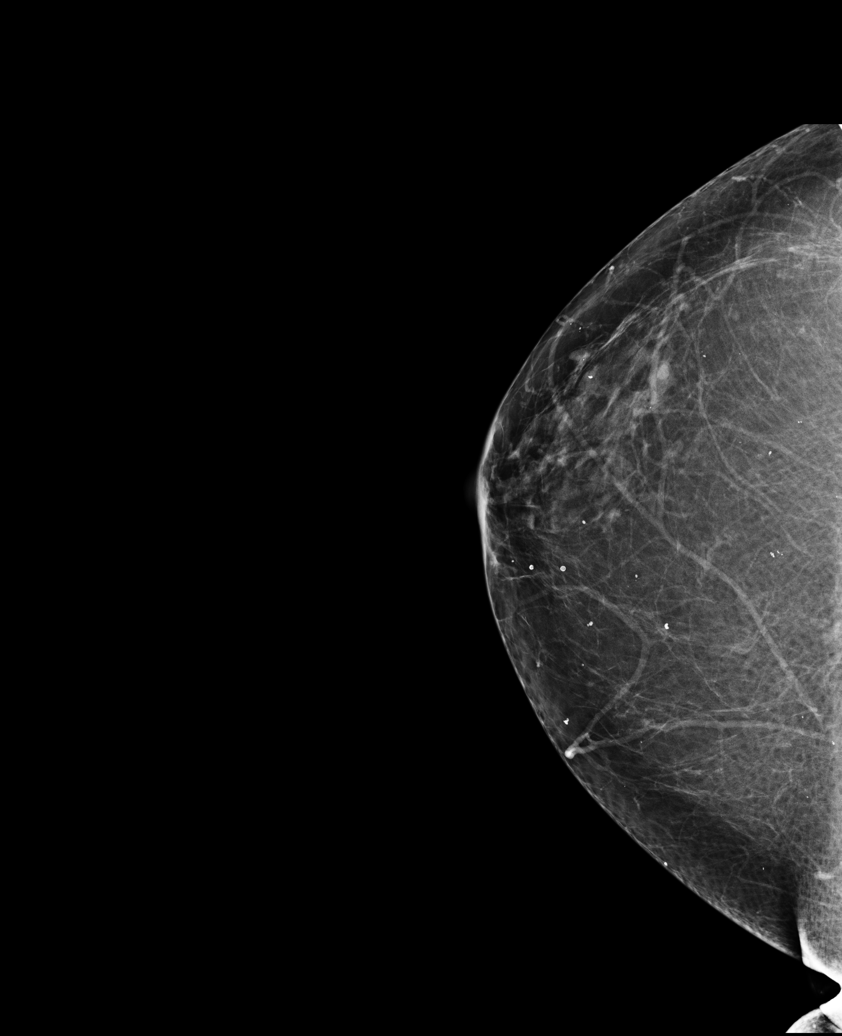

[R MLO (1 of 2)]
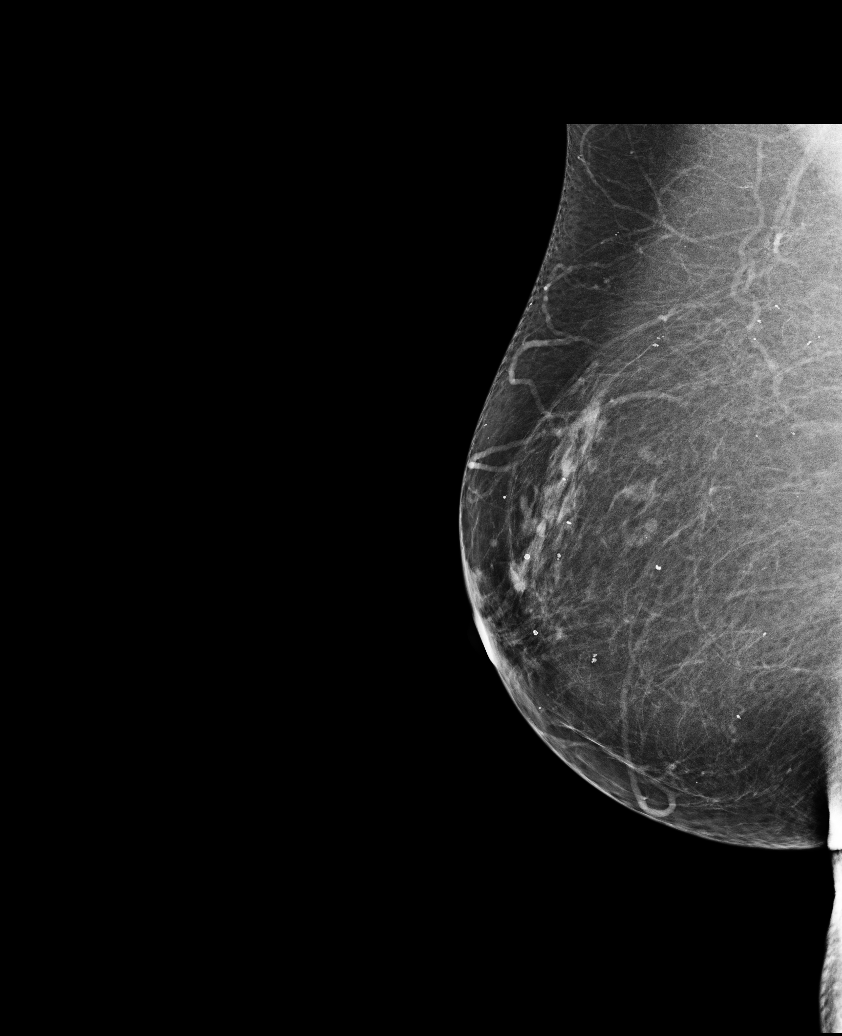

[R MLO (2 of 2)]
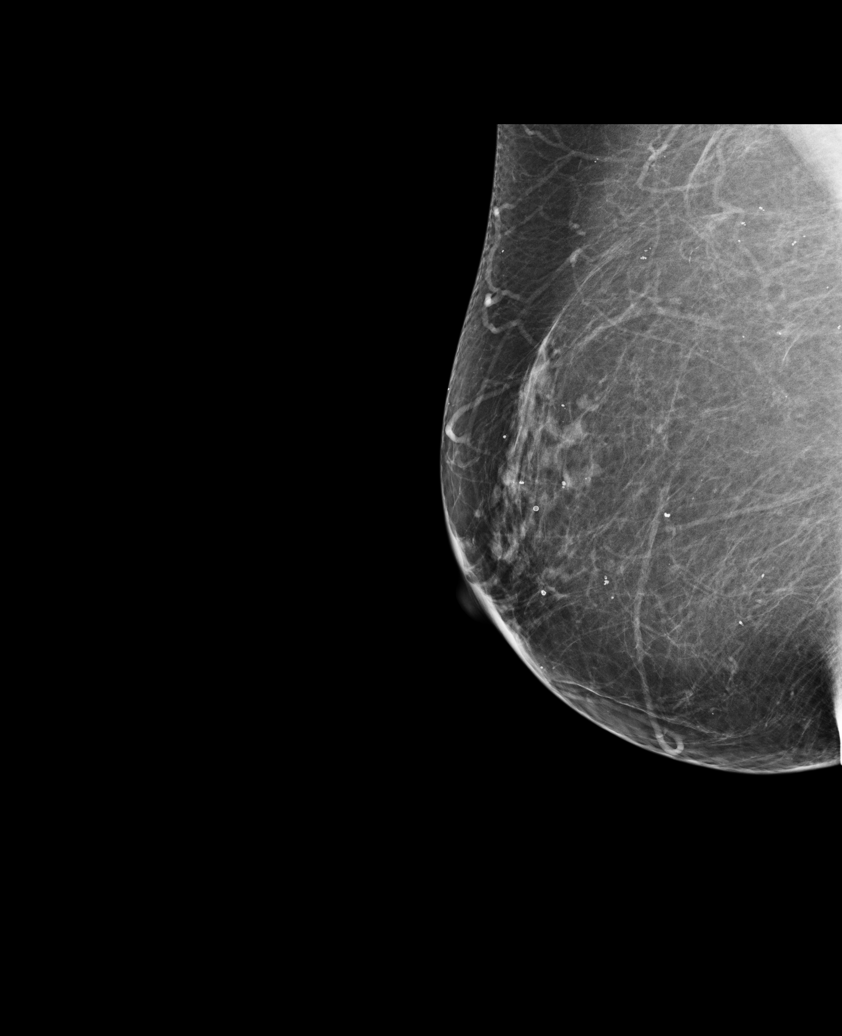

[5 of 5 positions shown; findings below may reference images not displayed]

FINDINGS: There are no findings suspicious for malignancy. Images were
processed with CAD.
IMPRESSION: No mammographic evidence of malignancy. A result letter of this
screening mammogram will be mailed directly to the patient.

RECOMMENDATION:
Screening mammogram in one year. (Code:JV-X-DYE)

BI-RADS CATEGORY  1: Negative.

## 2016-06-02 ENCOUNTER — Other Ambulatory Visit: Payer: Self-pay | Admitting: Obstetrics & Gynecology

## 2016-07-06 DIAGNOSIS — Z6836 Body mass index (BMI) 36.0-36.9, adult: Secondary | ICD-10-CM | POA: Diagnosis not present

## 2016-07-06 DIAGNOSIS — J069 Acute upper respiratory infection, unspecified: Secondary | ICD-10-CM | POA: Diagnosis not present

## 2016-07-06 DIAGNOSIS — Z85820 Personal history of malignant melanoma of skin: Secondary | ICD-10-CM | POA: Diagnosis not present

## 2016-07-06 DIAGNOSIS — Z08 Encounter for follow-up examination after completed treatment for malignant neoplasm: Secondary | ICD-10-CM | POA: Diagnosis not present

## 2016-07-06 DIAGNOSIS — X32XXXD Exposure to sunlight, subsequent encounter: Secondary | ICD-10-CM | POA: Diagnosis not present

## 2016-07-06 DIAGNOSIS — Z1283 Encounter for screening for malignant neoplasm of skin: Secondary | ICD-10-CM | POA: Diagnosis not present

## 2016-07-06 DIAGNOSIS — L820 Inflamed seborrheic keratosis: Secondary | ICD-10-CM | POA: Diagnosis not present

## 2016-07-06 DIAGNOSIS — Z1389 Encounter for screening for other disorder: Secondary | ICD-10-CM | POA: Diagnosis not present

## 2016-07-06 DIAGNOSIS — L570 Actinic keratosis: Secondary | ICD-10-CM | POA: Diagnosis not present

## 2016-10-05 DIAGNOSIS — L820 Inflamed seborrheic keratosis: Secondary | ICD-10-CM | POA: Diagnosis not present

## 2016-10-05 DIAGNOSIS — Z08 Encounter for follow-up examination after completed treatment for malignant neoplasm: Secondary | ICD-10-CM | POA: Diagnosis not present

## 2016-10-05 DIAGNOSIS — Z85820 Personal history of malignant melanoma of skin: Secondary | ICD-10-CM | POA: Diagnosis not present

## 2016-10-05 DIAGNOSIS — Z1283 Encounter for screening for malignant neoplasm of skin: Secondary | ICD-10-CM | POA: Diagnosis not present

## 2016-11-02 DIAGNOSIS — Z1389 Encounter for screening for other disorder: Secondary | ICD-10-CM | POA: Diagnosis not present

## 2016-11-02 DIAGNOSIS — E6609 Other obesity due to excess calories: Secondary | ICD-10-CM | POA: Diagnosis not present

## 2016-11-02 DIAGNOSIS — J301 Allergic rhinitis due to pollen: Secondary | ICD-10-CM | POA: Diagnosis not present

## 2016-11-02 DIAGNOSIS — J0100 Acute maxillary sinusitis, unspecified: Secondary | ICD-10-CM | POA: Diagnosis not present

## 2016-11-02 DIAGNOSIS — Z6836 Body mass index (BMI) 36.0-36.9, adult: Secondary | ICD-10-CM | POA: Diagnosis not present

## 2016-12-05 ENCOUNTER — Telehealth: Payer: Self-pay | Admitting: Internal Medicine

## 2016-12-05 NOTE — Telephone Encounter (Signed)
Pt called to schedule a colonoscopy because her work is requiring them to take a health assessment. Her last colonoscopy was in 2009 and is on the recall for another in 2019. Please advise if she can schedule colonoscopy now or should she wait until 2019.  She is NICed, but I can't find the recommendations in epic. Patient is wanting a copy of last colonoscopy report and has sent me a signed release.

## 2016-12-11 NOTE — Telephone Encounter (Signed)
Patient would like to wait until 09/2017 to have her tcs.  Manuela Schwartz, can you mail the patient a copy of her last tcs.

## 2016-12-11 NOTE — Telephone Encounter (Signed)
done

## 2016-12-18 ENCOUNTER — Ambulatory Visit (INDEPENDENT_AMBULATORY_CARE_PROVIDER_SITE_OTHER): Payer: PRIVATE HEALTH INSURANCE | Admitting: Adult Health

## 2016-12-18 ENCOUNTER — Encounter: Payer: Self-pay | Admitting: Adult Health

## 2016-12-18 VITALS — BP 130/80 | HR 76 | Wt 228.0 lb

## 2016-12-18 DIAGNOSIS — N898 Other specified noninflammatory disorders of vagina: Secondary | ICD-10-CM | POA: Diagnosis not present

## 2016-12-18 DIAGNOSIS — N952 Postmenopausal atrophic vaginitis: Secondary | ICD-10-CM | POA: Insufficient documentation

## 2016-12-18 DIAGNOSIS — R300 Dysuria: Secondary | ICD-10-CM

## 2016-12-18 LAB — POCT WET PREP (WET MOUNT)

## 2016-12-18 LAB — POCT URINALYSIS DIPSTICK
Blood, UA: NEGATIVE
Glucose, UA: NEGATIVE
Leukocytes, UA: NEGATIVE
Nitrite, UA: NEGATIVE
Protein, UA: NEGATIVE

## 2016-12-18 MED ORDER — ESTROGENS, CONJUGATED 0.625 MG/GM VA CREA: TOPICAL_CREAM | VAGINAL | 2 refills | Status: DC

## 2016-12-18 NOTE — Progress Notes (Signed)
Subjective:     Patient ID: Stacy Herrera, female   DOB: December 27, 1945, 71 y.o.   MRN: [id number]  HPI Stacy Herrera is a 71 year old white female in complaining of vaginal itching and burning with urination, PCP stopped her estrace.   Review of Systems Vaginal itching Burning with urination Reviewed past medical,surgical, social and family history. Reviewed medications and allergies.     Objective:   Physical Exam BP 130/80 (BP Location: Left Arm, Patient Position: Sitting, Cuff Size: Large)   Pulse 76   Wt 228 lb (103.4 kg)   BMI 36.80 kg/m urine dipstick negative.Skin warm and dry.Pelvic: external genitalia is normal in appearance no lesions, vagina: pale and dry,urethra has no lesions or masses noted, cervix and uterus are absent, adnexa: no masses or tenderness noted. Bladder is non tender and no masses felt. Wet prep: few +WBCs.   PHQ 2 score 0.  Assessment:     1. Vaginal itching   2. Burning with urination   3. Vaginal atrophy       Plan:     Rx premarin vaginal cream use 0.5 gm in vagina daily for 2 weeks then 2-3 x weekly F/U prn

## 2016-12-18 NOTE — Patient Instructions (Signed)
F/U prn

## 2016-12-27 IMAGING — CR DG FOOT COMPLETE 3+V*L*
3 series · 3 of 3 positions shown · non-contrast
Comparison: None in PACs

CLINICAL DATA: Left medial heel pain for the past 3-4 days without
known injury

EXAM:
LEFT FOOT - COMPLETE 3+ VIEW

[view not recorded (1 of 3)]
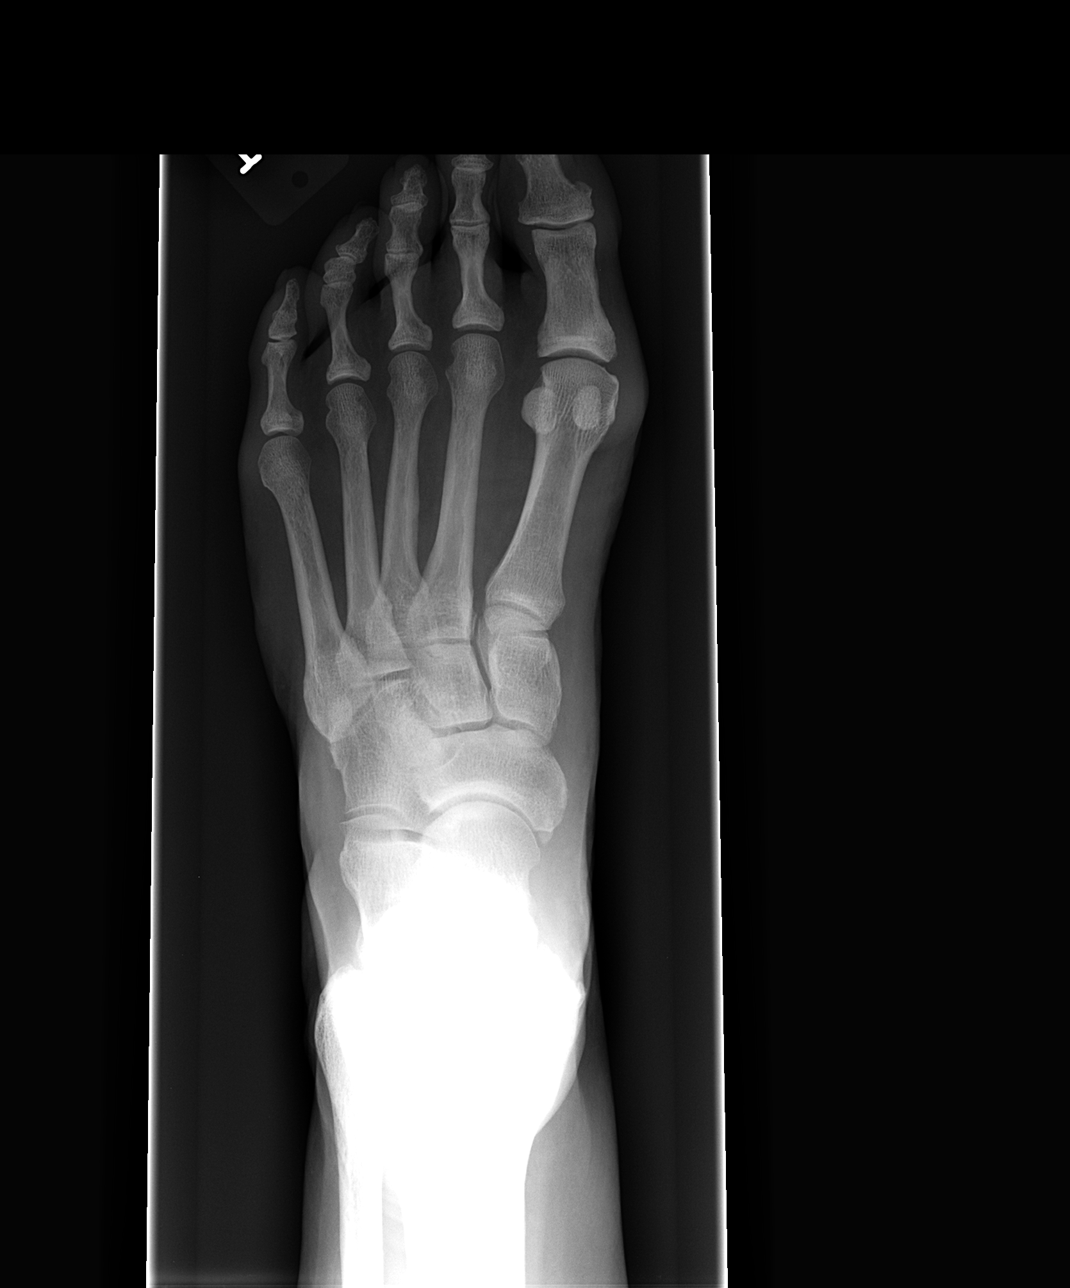

[view not recorded (2 of 3)]
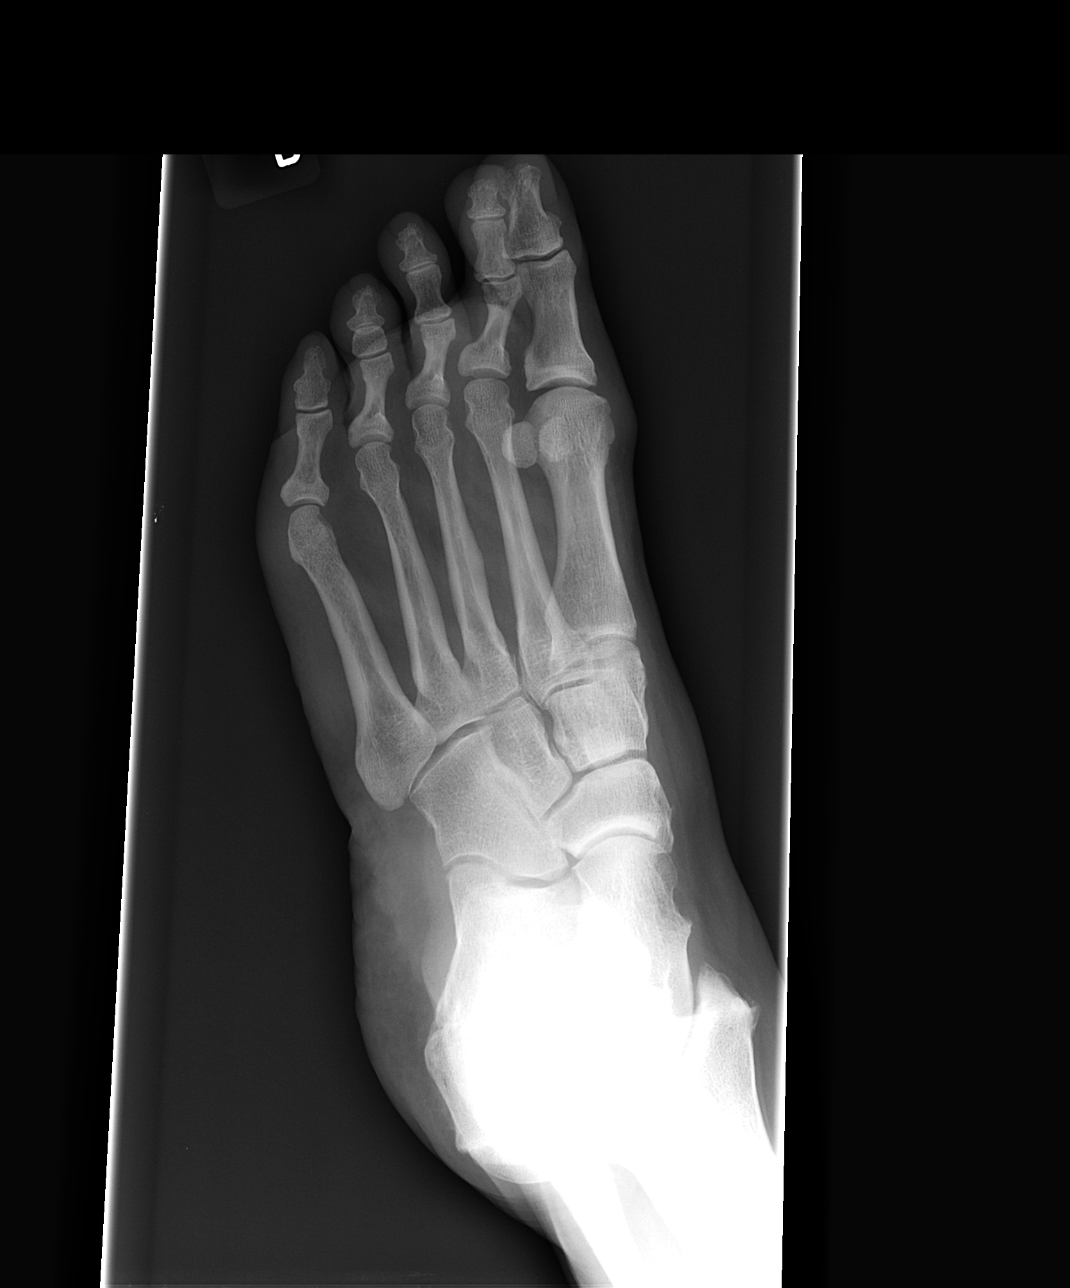

[view not recorded (3 of 3)]
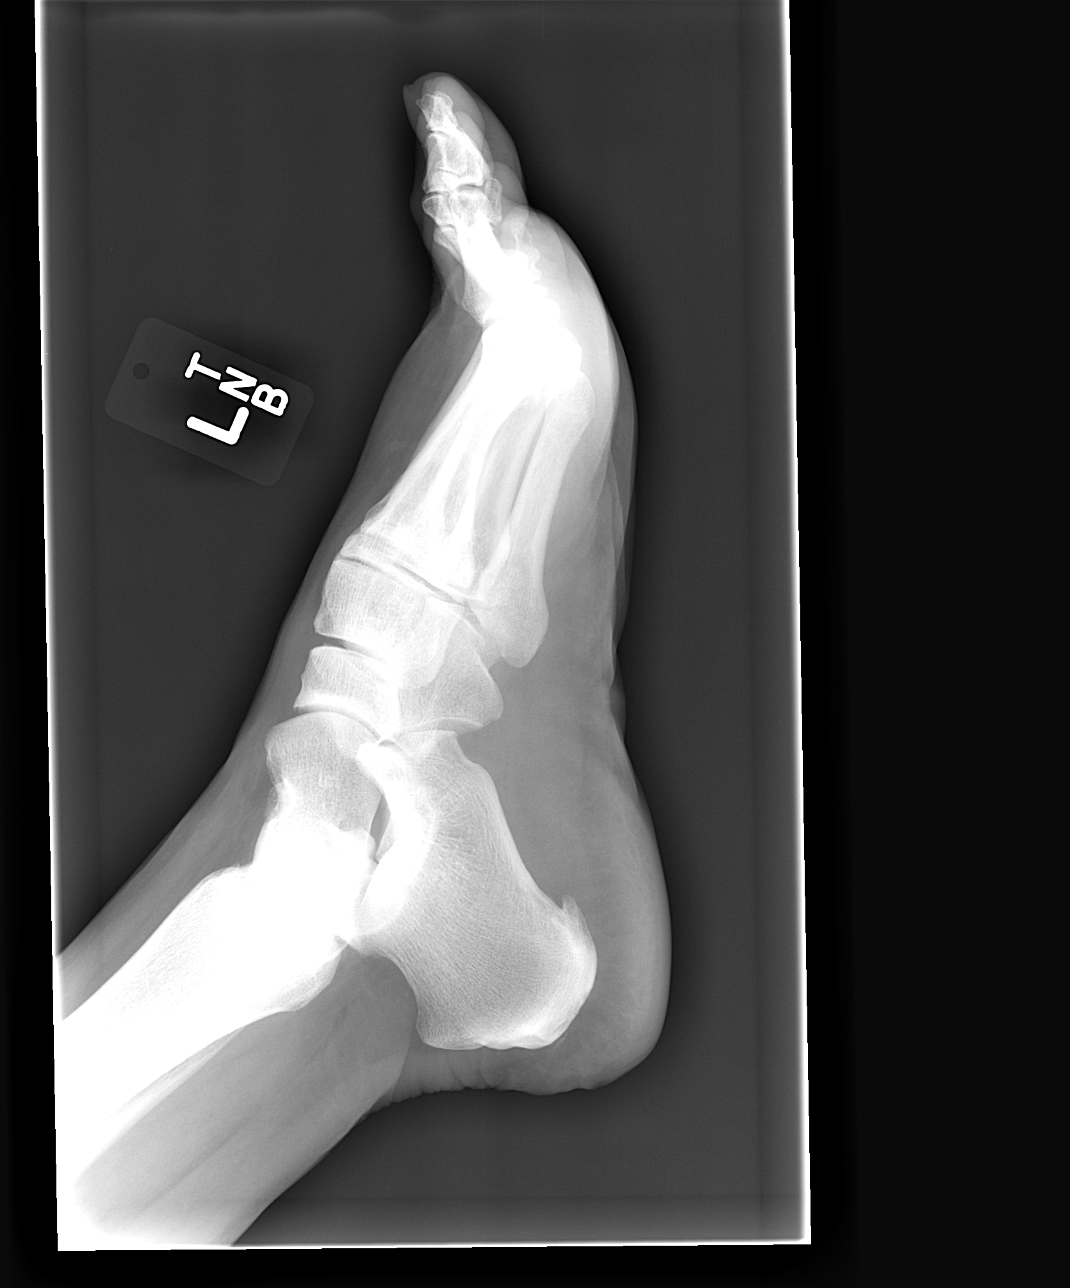

[3 of 3 positions shown; findings below may reference images not displayed]

FINDINGS: The bones of the left foot are adequately mineralized. There is a
moderate-sized plantar calcaneal spur. The overlying soft tissues
are unremarkable. The phalanges, metatarsals, and other tarsal bones
exhibit no significant abnormalities. The soft tissues are
unremarkable.
IMPRESSION: There is no acute bony abnormality of the left foot. There is a
moderate-sized plantar calcaneal spur.

## 2017-02-13 DIAGNOSIS — M5410 Radiculopathy, site unspecified: Secondary | ICD-10-CM | POA: Diagnosis not present

## 2017-02-13 DIAGNOSIS — Z1389 Encounter for screening for other disorder: Secondary | ICD-10-CM | POA: Diagnosis not present

## 2017-02-13 DIAGNOSIS — Z6838 Body mass index (BMI) 38.0-38.9, adult: Secondary | ICD-10-CM | POA: Diagnosis not present

## 2017-02-13 DIAGNOSIS — M5136 Other intervertebral disc degeneration, lumbar region: Secondary | ICD-10-CM | POA: Diagnosis not present

## 2017-02-23 ENCOUNTER — Other Ambulatory Visit (HOSPITAL_COMMUNITY): Payer: Self-pay | Admitting: Family Medicine

## 2017-02-23 DIAGNOSIS — M5136 Other intervertebral disc degeneration, lumbar region: Secondary | ICD-10-CM

## 2017-02-23 DIAGNOSIS — M545 Low back pain: Secondary | ICD-10-CM

## 2017-02-23 DIAGNOSIS — M5416 Radiculopathy, lumbar region: Secondary | ICD-10-CM

## 2017-03-01 ENCOUNTER — Ambulatory Visit (HOSPITAL_COMMUNITY)
Admission: RE | Admit: 2017-03-01 | Discharge: 2017-03-01 | Disposition: A | Discharge status: Home / Self Care | Payer: PRIVATE HEALTH INSURANCE | Source: Ambulatory Visit | Attending: Family Medicine | Admitting: Family Medicine

## 2017-03-01 DIAGNOSIS — M5126 Other intervertebral disc displacement, lumbar region: Secondary | ICD-10-CM | POA: Diagnosis not present

## 2017-03-01 DIAGNOSIS — M5410 Radiculopathy, site unspecified: Secondary | ICD-10-CM | POA: Diagnosis not present

## 2017-03-01 DIAGNOSIS — M545 Low back pain: Secondary | ICD-10-CM

## 2017-03-01 DIAGNOSIS — M4316 Spondylolisthesis, lumbar region: Secondary | ICD-10-CM | POA: Diagnosis not present

## 2017-03-01 DIAGNOSIS — M48061 Spinal stenosis, lumbar region without neurogenic claudication: Secondary | ICD-10-CM | POA: Insufficient documentation

## 2017-03-01 DIAGNOSIS — M47816 Spondylosis without myelopathy or radiculopathy, lumbar region: Secondary | ICD-10-CM | POA: Diagnosis not present

## 2017-03-01 DIAGNOSIS — M5136 Other intervertebral disc degeneration, lumbar region: Secondary | ICD-10-CM

## 2017-03-01 DIAGNOSIS — M5416 Radiculopathy, lumbar region: Secondary | ICD-10-CM

## 2017-03-27 ENCOUNTER — Other Ambulatory Visit: Payer: Self-pay | Admitting: Obstetrics & Gynecology

## 2017-03-27 DIAGNOSIS — Z1231 Encounter for screening mammogram for malignant neoplasm of breast: Secondary | ICD-10-CM

## 2017-03-30 ENCOUNTER — Ambulatory Visit (HOSPITAL_COMMUNITY)
Admission: RE | Admit: 2017-03-30 | Discharge: 2017-03-30 | Disposition: A | Discharge status: Home / Self Care | Payer: PRIVATE HEALTH INSURANCE | Source: Ambulatory Visit | Attending: Obstetrics & Gynecology | Admitting: Obstetrics & Gynecology

## 2017-03-30 DIAGNOSIS — Z1231 Encounter for screening mammogram for malignant neoplasm of breast: Secondary | ICD-10-CM | POA: Diagnosis present

## 2017-04-08 ENCOUNTER — Other Ambulatory Visit: Payer: Self-pay | Admitting: Obstetrics & Gynecology

## 2017-04-11 DIAGNOSIS — M4727 Other spondylosis with radiculopathy, lumbosacral region: Secondary | ICD-10-CM | POA: Diagnosis not present

## 2017-04-11 DIAGNOSIS — M4317 Spondylolisthesis, lumbosacral region: Secondary | ICD-10-CM | POA: Diagnosis not present

## 2017-04-19 DIAGNOSIS — Z08 Encounter for follow-up examination after completed treatment for malignant neoplasm: Secondary | ICD-10-CM | POA: Diagnosis not present

## 2017-04-19 DIAGNOSIS — Z1283 Encounter for screening for malignant neoplasm of skin: Secondary | ICD-10-CM | POA: Diagnosis not present

## 2017-04-19 DIAGNOSIS — X32XXXD Exposure to sunlight, subsequent encounter: Secondary | ICD-10-CM | POA: Diagnosis not present

## 2017-04-19 DIAGNOSIS — D225 Melanocytic nevi of trunk: Secondary | ICD-10-CM | POA: Diagnosis not present

## 2017-04-19 DIAGNOSIS — L820 Inflamed seborrheic keratosis: Secondary | ICD-10-CM | POA: Diagnosis not present

## 2017-04-19 DIAGNOSIS — L570 Actinic keratosis: Secondary | ICD-10-CM | POA: Diagnosis not present

## 2017-04-19 DIAGNOSIS — Z85820 Personal history of malignant melanoma of skin: Secondary | ICD-10-CM | POA: Diagnosis not present

## 2017-05-02 ENCOUNTER — Other Ambulatory Visit: Payer: Self-pay

## 2017-05-02 ENCOUNTER — Encounter (HOSPITAL_COMMUNITY): Payer: Self-pay | Admitting: Physical Therapy

## 2017-05-02 ENCOUNTER — Telehealth (HOSPITAL_COMMUNITY): Payer: Self-pay | Admitting: Physical Therapy

## 2017-05-02 ENCOUNTER — Ambulatory Visit (HOSPITAL_COMMUNITY): Payer: PRIVATE HEALTH INSURANCE | Attending: Neurological Surgery | Admitting: Physical Therapy

## 2017-05-02 DIAGNOSIS — R2689 Other abnormalities of gait and mobility: Secondary | ICD-10-CM | POA: Insufficient documentation

## 2017-05-02 DIAGNOSIS — G8929 Other chronic pain: Secondary | ICD-10-CM | POA: Insufficient documentation

## 2017-05-02 DIAGNOSIS — R29898 Other symptoms and signs involving the musculoskeletal system: Secondary | ICD-10-CM

## 2017-05-02 DIAGNOSIS — M6281 Muscle weakness (generalized): Secondary | ICD-10-CM | POA: Insufficient documentation

## 2017-05-02 DIAGNOSIS — M545 Low back pain: Secondary | ICD-10-CM | POA: Diagnosis not present

## 2017-05-02 NOTE — Therapy (Signed)
[facility] [place], [place], [address] Phone: [phone]   Fax:  [phone]  Physical Therapy Evaluation  Patient Details  Name: Stacy Herrera MRN: [id number] Date of Birth: 06/21/1945 Referring Provider: Aldean Ast, MD   Encounter Date: 05/02/2017  PT End of Session - 05/02/17 1110    Visit Number  1    Number of Visits  17    Date for PT Re-Evaluation  05/30/17    Authorization Type  Primary [employer]; Secondary Medicare; [place] Time Period  05/02/17 to 06/29/17    PT Start Time  1116    PT Stop Time  1206    PT Time Calculation (min)  50 min    Activity Tolerance  Patient tolerated treatment well;No increased pain;Patient limited by pain    Behavior During Therapy  [facility] for tasks assessed/performed       Past Medical History:  Diagnosis Date  . Encounter for monitoring postmenopausal estrogen replacement therapy 03/24/2013  . High cholesterol   . OAB (overactive bladder)   . UI (urinary incontinence) 03/24/2013  . Umbilical hernia   . Urgency incontinence   . UTI (lower urinary tract infection) 07/25/2013    Past Surgical History:  Procedure Laterality Date  . ABDOMINAL HYSTERECTOMY    . bladder mesh    . BREAST REDUCTION SURGERY    . BREAST SURGERY     reduction  . CYSTOCELE REPAIR     cystocele and rectocele  . KNEE SURGERY    . lt knee surgery    . NASAL SINUS SURGERY    . ROTATOR CUFF REPAIR    . TOE SURGERY    . TONSILLECTOMY      There were no vitals filed for this visit.   Subjective Assessment - 05/02/17 1129    Subjective  Patient reported that she has been having back pain since October of 2018 and that she has had back pain in the past. Patient reported that she had injections in both hips to help with the back pain. She stated that she went to the chiropractor for 6 weeks and did not feel that this helped her back. Patient stated that her pain can get  up to a 10/10 and this happened this morning when she was bending to get dressed. Patient did report a history of incontinence, which did not begin at the same time as the back pain and which has been occurring since her bladder surgeries, patient reported some bowel urgency, but stated that this did not begin at the same time as the back pain. In addition, patient did report weight gain, however she stated this was due to thyroid issues. Patient also reported some night sweats, which she suspects is due to aging. Patient reported taking vitamin D and calcium, but is unsure of the amounts of each. Patient's main goal is that she would like to have less pain in her back.     Pertinent History  Degenerative spondylolisthesis of L4 with a bulging uncovered disc; S/P left unicompartmental knee replacement February 2018    Limitations  Lifting;Standing;Walking;House hold activities    How long can you sit comfortably?  Not limited    How long can you stand comfortably?  3-4 minutes    How long can you walk comfortably?  Difficult getting started, but seems to ease up    Diagnostic tests  MRI lumbar spine 03/01/17: "Degenerative spondylolisthesis of  L4 with a bulging uncovered disc"    Patient Stated Goals  Be pain free    Currently in Pain?  Yes    Pain Score  7     Pain Location  Back    Pain Orientation  Other (Comment) Lower back middle    Pain Descriptors / Indicators  Sharp;Aching    Pain Type  Chronic pain    Pain Radiating Towards  Down the left and stops above the knee    Pain Onset  More than a month ago    Pain Frequency  Intermittent    Aggravating Factors   Not sure; possibly walking    Pain Relieving Factors  Sitting    Effect of Pain on Daily Activities  Moderately     Multiple Pain Sites  No         05/02/17 0001  Assessment  Medical Diagnosis Spondylolisthesis, lumbosacral region  Referring Provider Aldean Ast, MD  Onset Date/Surgical Date (Around October 2018)  Next MD  Visit 05/29/17  Prior Therapy No  Precautions  Precautions None  Restrictions  Weight Bearing Restrictions No  Balance Screen  Has the patient fallen in the past 6 months No  Has the patient had a decrease in activity level because of a fear of falling?  Yes  Is the patient reluctant to leave their home because of a fear of falling?  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  Living Arrangements Children  Prior Function  Level of Independence Independent  Vocation Other (comment) (Employed)  Cognition  Overall Cognitive Status Within Functional Limits for tasks assessed  Observation/Other Assessments  Observations Patient demonstrated increased sway back  Focus on Therapeutic Outcomes (FOTO)  44% (56% limited)  Sensation  Light Touch Appears Intact  Additional Comments Patient's tone appeared WNL, Reflexes for patellar and achilles were 2+ bilaterally  Squat  Comments Patient performed using upper extremity for support and increased forward flexion  AROM  Lumbar Flexion WNL (can touch the floor)  Lumbar Extension Severely limited (painful)  Lumbar - Right Side Bend WNL  Lumbar - Left Side Bend WNL  Lumbar - Right Rotation Moderately limited and painful  Lumbar - Left Rotation Moderately limited and painful  Strength  Right/Left Hip Right;Left  Right/Left Knee Right;Left  Right/Left Ankle Right;Left  Right Hip Flexion 4+/5  Right Hip Extension 3+/5  Right Hip ABduction 4+/5  Left Hip Flexion 4-/5  Left Hip Extension 3+/5  Left Hip ABduction 4+/5  Right Knee Flexion 5/5  Right Knee Extension 5/5  Left Knee Flexion 4+/5  Left Knee Extension 4/5  Right Ankle Dorsiflexion 5/5  Left Ankle Dorsiflexion 5/5  Lumbar Flexion 3/5  Flexibility  Hamstrings Not limited, increased mobility  Palpation  Spinal mobility With posterior to anterior spinal mobility testing noted relative hypomobility of thoracic spine and hypermobility of lumbar spine  Palpation  comment Patient reported tenderness to palpation around bilateral PSIS, and around piriformis muscles bilaterally, as well as around L4/L5 lumbar spine  Special Tests  Lumbar Tests Slump Test  Slump test  Findings Negative  Comment Negative bilaterally  Ambulation/Gait  Gait Comments Patient's gait was observed while patient ambulated into and out of the clinic. Patient demonstrated trendelenberg gait pattern.   Static Standing Balance  Static Standing - Balance Support No upper extremity supported  Static Standing Balance -  Activities  Single Leg Stance - Right Leg;Single Leg Stance - Left Leg  Static Standing - Comment/# of Minutes Single  leg stance on the left lower extremity 5 seconds; SLS on the right lower extremity 3 seconds  Standardized Balance Assessment  Five times sit to stand comments  15.18 seconds        Objective measurements completed on examination: See above findings.      [place] Adult PT Treatment/Exercise - 05/02/17 0001      Lumbar Exercises: Supine   Ab Set  10 reps;Other (comment) Ab set with heel slide x 10 each lower extremity supine    Bridge  10 reps;2 seconds             PT Education - 05/02/17 2007    Education provided  Yes    Education Details  Patient was educated on examination findings, plan of care and home exercise program.     Person(s) Educated  Patient    Methods  Explanation;Demonstration;Verbal cues;Tactile cues;Handout    Comprehension  Verbalized understanding;Returned demonstration;Need further instruction       PT Short Term Goals - 05/02/17 1402      PT SHORT TERM GOAL #1   Title  Patient will demonstrate understanding and report regular compliance with HEP.    Time  4    Period  Weeks    Status  New    Target Date  05/30/17      PT SHORT TERM GOAL #2   Title  Patient will demonstrate improved MMT strength of 1/2 grade in all deficient tested musculature in order to assist patient with lifting, squatting, and  functional mobility with proper mechanics.     Time  4    Period  Weeks    Status  New    Target Date  05/30/17      PT SHORT TERM GOAL #3   Title  Patient will demonstrate ability to maintain single limb stance on each lower extremity for 8 seconds demonstrating improved balance to assist with gait and stairs.     Time  4    Period  Weeks    Status  New    Target Date  05/30/17        PT Long Term Goals - 05/02/17 1410      PT LONG TERM GOAL #1   Title  Patient will demonstrate MMT strength of 5/5 or an improvement of 1 MMT grade in all deficient tested musculature in order to assist patient with ambulation, squatting, lifting, and functional mobility.     Time  8    Period  Weeks    Status  New    Target Date  06/27/17      PT LONG TERM GOAL #2   Title  Patient will report pain has not extended past gluteal region on left side and pain not exceeding 2/10 over the course of 1 week  indicating centralization of symptoms and better tolerance to functional activities.     Time  8    Period  Weeks    Status  New    Target Date  06/27/17      PT LONG TERM GOAL #3   Title  Patient will perform the 5 times sit to stand test in 12.6 seconds or less indicating improved balance, improved functional mobility, and decreased risk of falls.     Time  8    Period  Weeks    Status  New    Target Date  06/27/17      PT LONG TERM GOAL #4   Title  Patient will  demonstrate an improvement in FOTO score of 13 percent indicating improvement in patient's perceived level of functional mobility.     Time  8    Period  Weeks    Status  New    Target Date  06/27/17               05/02/17 1224  Plan  Clinical Impression Statement Patient is a pleasant 72 year old female who presented to physical therapy for evaluation due to low back pain. Upon examination, patient demonstrated decreased lumbar active range of motion, patient decreased lower extremity and lumbar strength. Patient also  demonstrated decreased balance as evidence of the 5xSTS and with single leg stance testing. In addition, with palpation patient experienced pain at L4/L5 region, as well as at piriformis muscles bilaterally, and at bilateral PSIS. Patient demonstrated poor form with squatting to pick up an object. Patient reported back pain at the time of the evaluation, and also back pain that can reach a 10/10 over the last week. In addition, patient's FOTO score was found to be 44% and 56% limited. Patient was educated to follow-up with her physician regarding the night sweats, incontinence, bowel urgency, and weight loss. Patient would benefit from skilled physical therapy in order to address the abovementioned deficits and improve overall functional mobility.  History and Personal Factors relevant to plan of care: Degenerative spondylolisthesis of L4, S/P left unicompartmental knee replacement  Clinical Presentation Stable  Clinical Presentation due to: MMT, ROM screening, FOTO, 5xSTS, clinical judgement  Clinical Decision Making Low  Pt will benefit from skilled therapeutic intervention in order to improve on the following deficits Abnormal gait;Decreased balance;Decreased mobility;Difficulty walking;Increased muscle spasms;Decreased range of motion;Improper body mechanics;Obesity;Decreased activity tolerance;Decreased strength;Increased fascial restricitons;Impaired flexibility;Pain;Postural dysfunction  Rehab Potential Good  Clinical Impairments Affecting Rehab Potential Positive: Patient's positive attitude, motivation; Negative: chronicity of pain  PT Frequency 2x / week  PT Duration 8 weeks  PT Treatment/Interventions ADLs/Self Care Home Management;Aquatic Therapy;Cryotherapy;Moist Heat;Gait training;Stair training;Functional mobility training;Therapeutic activities;Therapeutic exercise;Balance training;Neuromuscular re-education;Patient/family education;Orthotic Fit/Training;Manual techniques;Passive range of  motion;Dry needling;Energy conservation;Taping  PT Next Visit Plan Review patient's evaluation, goals, and HEP add abdominal set with heel slide for HEP next session. Follow-up about patient speaking to physician about weight loss, bladder/bowel issues, and night sweats. Initiate lumbar spinal stabilization exercises in supine. Lower extremity strengthening progressing to standing functional lower extremity strengthening.  PT Home Exercise Plan Eval: Bridges 15 x 1x/day  Consulted and Agree with Plan of Care Patient    Patient will benefit from skilled therapeutic intervention in order to improve the following deficits and impairments:  Abnormal gait, Decreased balance, Decreased mobility, Difficulty walking, Increased muscle spasms, Decreased range of motion, Improper body mechanics, Obesity, Decreased activity tolerance, Decreased strength, Increased fascial restricitons, Impaired flexibility, Pain, Postural dysfunction  Visit Diagnosis: Chronic midline low back pain, with sciatica presence unspecified  Muscle weakness (generalized)  Other symptoms and signs involving the musculoskeletal system  Other abnormalities of gait and mobility     Problem List Patient Active Problem List   Diagnosis Date Noted  . Burning with urination 12/18/2016  . Vaginal itching 12/18/2016  . Vaginal atrophy 12/18/2016  . UTI (lower urinary tract infection) 07/25/2013  . Encounter for monitoring postmenopausal estrogen replacement therapy 03/24/2013  . UI (urinary incontinence) 03/24/2013  . OAB (overactive bladder) 03/24/2013  . Urinary tract infection, site not specified 05/30/2012   Clarene Critchley PT, DPT 8:30 PM, 05/02/17 [address]  [place], [place], [address] Phone: [phone]   Fax:  [phone]  Name: MARIEKE LUBKE MRN: [id number] Date of Birth: Dec 08, 1945

## 2017-05-02 NOTE — Patient Instructions (Addendum)
  BRIDGING While lying on your back with knees bent, tighten your lower abdominals, squeeze your buttocks and then raise your buttocks off the floor/bed as creating a "Bridge" with your body. Hold and then lower yourself and Repeat. Repeat 10 Times Hold 2 Seconds Complete 1 Set Perform 1 Time(s) a Day

## 2017-05-02 NOTE — Telephone Encounter (Signed)
L/m to schedule apptment - sorry she didn't get scheduled during lunch --no answer - . NF

## 2017-05-03 ENCOUNTER — Telehealth (HOSPITAL_COMMUNITY): Payer: Self-pay | Admitting: Physical Therapy

## 2017-05-03 NOTE — Addendum Note (Signed)
Addended by: Clarene Critchley on: 05/03/2017 07:46 AM   Modules accepted: Orders

## 2017-05-03 NOTE — Telephone Encounter (Signed)
Therapist called and left a message for the patient that the heel slide exercise she had given the patient was not the same picture and description as what had been practiced in therapy and to hold off on doing that exercise from home exercise program until it could be reviewed at the next session. Call completed at 11:27 am.   Clarene Critchley PT, DPT 11:28 AM, 05/03/17 [phone]

## 2017-05-04 ENCOUNTER — Telehealth (HOSPITAL_COMMUNITY): Payer: Self-pay | Admitting: Physical Therapy

## 2017-05-04 NOTE — Telephone Encounter (Signed)
Therapist received notice from the front desk that the patient had called and wished to speak with therapist. Therapist called and left message and told patient to call her back once she received the phone call. Call completed at 10:38 am.  Clarene Critchley PT, DPT 10:39 AM, 05/04/17 [phone]

## 2017-05-29 DIAGNOSIS — M4317 Spondylolisthesis, lumbosacral region: Secondary | ICD-10-CM | POA: Diagnosis not present

## 2017-06-07 DIAGNOSIS — M5416 Radiculopathy, lumbar region: Secondary | ICD-10-CM | POA: Diagnosis not present

## 2017-06-07 DIAGNOSIS — M48062 Spinal stenosis, lumbar region with neurogenic claudication: Secondary | ICD-10-CM | POA: Diagnosis not present

## 2017-06-20 DIAGNOSIS — Z6838 Body mass index (BMI) 38.0-38.9, adult: Secondary | ICD-10-CM | POA: Diagnosis not present

## 2017-06-20 DIAGNOSIS — M5416 Radiculopathy, lumbar region: Secondary | ICD-10-CM | POA: Diagnosis not present

## 2017-06-20 DIAGNOSIS — M48062 Spinal stenosis, lumbar region with neurogenic claudication: Secondary | ICD-10-CM | POA: Diagnosis not present

## 2017-06-20 DIAGNOSIS — I10 Essential (primary) hypertension: Secondary | ICD-10-CM | POA: Diagnosis not present

## 2017-07-17 ENCOUNTER — Other Ambulatory Visit: Payer: Self-pay

## 2017-07-17 ENCOUNTER — Encounter: Payer: Self-pay | Admitting: Obstetrics & Gynecology

## 2017-07-17 ENCOUNTER — Ambulatory Visit (INDEPENDENT_AMBULATORY_CARE_PROVIDER_SITE_OTHER): Payer: PRIVATE HEALTH INSURANCE | Admitting: Obstetrics & Gynecology

## 2017-07-17 VITALS — BP 128/84 | HR 92 | Ht 66.0 in | Wt 228.0 lb

## 2017-07-17 DIAGNOSIS — L02216 Cutaneous abscess of umbilicus: Secondary | ICD-10-CM

## 2017-07-17 DIAGNOSIS — R1084 Generalized abdominal pain: Secondary | ICD-10-CM | POA: Diagnosis not present

## 2017-07-17 MED ORDER — CIPROFLOXACIN HCL 500 MG PO TABS: 500.0000 mg | ORAL_TABLET | Freq: Two times a day (BID) | ORAL | 0 refills | Status: DC

## 2017-07-17 NOTE — Progress Notes (Signed)
Chief Complaint  Patient presents with  . leaking from navel      72 y.o. G2P2 No LMP recorded. Patient has had a hysterectomy. The current method of family planning is status post hysterectomy.  Outpatient Encounter Medications as of 07/17/2017  Medication Sig  . Calcium Carbonate-Vit D-Min (CALCIUM 1200 PO) Take by mouth.  . fish oil-omega-3 fatty acids 1000 MG capsule Take 2 g by mouth daily.  Marland Kitchen glucosamine-chondroitin 500-400 MG tablet Take 2 tablets by mouth at bedtime.  Marland Kitchen levothyroxine (SYNTHROID, LEVOTHROID) 50 MCG tablet Take 50 mcg by mouth daily before breakfast.  . Multiple Vitamin (MULITIVITAMIN WITH MINERALS) TABS Take 1 tablet by mouth at bedtime.  . simvastatin (ZOCOR) 20 MG tablet TAKE 1 TABLET BY MOUTH EVERY EVENING FOR CHOLESTEROL  . ciprofloxacin (CIPRO) 500 MG tablet Take 1 tablet (500 mg total) by mouth 2 (two) times daily.  . [DISCONTINUED] conjugated estrogens (PREMARIN) vaginal cream Use 0.5 gm daily for 2 weeks, then 2-3 x weekly (Patient not taking: Reported on 05/02/2017)  . [DISCONTINUED] dexamethasone (DECADRON) 4 MG tablet Take 1 tablet (4 mg total) by mouth 2 (two) times daily with a meal. Please take with a meal. (Patient not taking: Reported on 12/18/2016)  . [DISCONTINUED] diclofenac (VOLTAREN) 75 MG EC tablet Take 75 mg by mouth 2 (two) times daily.  . [DISCONTINUED] gabapentin (NEURONTIN) 300 MG capsule Take 300 mg by mouth 3 (three) times daily.  . [DISCONTINUED] VITAMIN E PO Take 2 tablets by mouth at bedtime.   No facility-administered encounter medications on file as of 07/17/2017.     Subjective Stacy Herrera is in complaining of foul smelling discharge from her umbilicus just now and 1 month ago However she states she has had discomfort below the umbilicus for a year She has had no fever but discomfort No diarrhea or NV The discharge is watery clear and smelled so bad she thought it was spoiled food Past Medical History:    Diagnosis Date  . Encounter for monitoring postmenopausal estrogen replacement therapy 03/24/2013  . High cholesterol   . OAB (overactive bladder)   . UI (urinary incontinence) 03/24/2013  . Umbilical hernia   . Urgency incontinence   . UTI (lower urinary tract infection) 07/25/2013    Past Surgical History:  Procedure Laterality Date  . ABDOMINAL HYSTERECTOMY    . bladder mesh    . BREAST REDUCTION SURGERY    . BREAST SURGERY     reduction  . CYSTOCELE REPAIR     cystocele and rectocele  . KNEE SURGERY    . lt knee surgery    . NASAL SINUS SURGERY    . ROTATOR CUFF REPAIR    . TOE SURGERY    . TONSILLECTOMY      OB History    Gravida  2   Para  2   Term      Preterm      AB      Living  2     SAB      TAB      Ectopic      Multiple      Live Births              Allergies  Allergen Reactions  . Benadryl [Diphenhydramine Hcl]   . Penicillins Itching and Rash    Social History   Socioeconomic History  . Marital status: Divorced    Spouse name: Not on file  . Number  of children: Not on file  . Years of education: Not on file  . Highest education level: Not on file  Occupational History  . Not on file  Social Needs  . Financial resource strain: Not on file  . Food insecurity:    Worry: Not on file    Inability: Not on file  . Transportation needs:    Medical: Not on file    Non-medical: Not on file  Tobacco Use  . Smoking status: Former Smoker    Types: Cigarettes    Last attempt to quit: 02/28/1988    Years since quitting: 29.4  . Smokeless tobacco: Never Used  Substance and Sexual Activity  . Alcohol use: No    Comment: occ.  . Drug use: No  . Sexual activity: Not Currently    Birth control/protection: Surgical  Lifestyle  . Physical activity:    Days per week: Not on file    Minutes per session: Not on file  . Stress: Not on file  Relationships  . Social connections:    Talks on phone: Not on file    Gets together: Not on  file    Attends religious service: Not on file    Active member of club or organization: Not on file    Attends meetings of clubs or organizations: Not on file    Relationship status: Not on file  Other Topics Concern  . Not on file  Social History Narrative  . Not on file    Family History  Problem Relation Age of Onset  . Diabetes Mother   . Cancer Mother        lung  . Cancer Father        lung  . Cancer Sister        lung  . Lymphoma Sister   . Diabetes Maternal Aunt   . Diabetes Paternal Grandmother   . Early death Paternal Grandmother   . Diabetes Other     Medications:       Current Outpatient Medications:  .  Calcium Carbonate-Vit D-Min (CALCIUM 1200 PO), Take by mouth., Disp: , Rfl:  .  fish oil-omega-3 fatty acids 1000 MG capsule, Take 2 g by mouth daily., Disp: , Rfl:  .  glucosamine-chondroitin 500-400 MG tablet, Take 2 tablets by mouth at bedtime., Disp: , Rfl:  .  levothyroxine (SYNTHROID, LEVOTHROID) 50 MCG tablet, Take 50 mcg by mouth daily before breakfast., Disp: , Rfl:  .  Multiple Vitamin (MULITIVITAMIN WITH MINERALS) TABS, Take 1 tablet by mouth at bedtime., Disp: , Rfl:  .  simvastatin (ZOCOR) 20 MG tablet, TAKE 1 TABLET BY MOUTH EVERY EVENING FOR CHOLESTEROL, Disp: 30 tablet, Rfl: 11 .  ciprofloxacin (CIPRO) 500 MG tablet, Take 1 tablet (500 mg total) by mouth 2 (two) times daily., Disp: 14 tablet, Rfl: 0  Objective Blood pressure 128/84, pulse 92, height 5\' 6"  (1.676 m), weight 228 lb (103.4 kg).  GEN WDWN NAD Abdomen soft tendr just inferior below the umbilicus no active drainage today No lower quadrant tenderness No masses palapble but the tenderness is the size of a golf ball   Pertinent ROS No burning with urination, frequency or urgency No nausea, vomiting or diarrhea Nor fever chills or other constitutional symptoms   Labs or studies     Impression Diagnoses this Encounter::   ICD-10-CM   1. Urachal abscess L02.216      Established relevant diagnosis(es):   Plan/Recommendations: Meds ordered this encounter  Medications  .  ciprofloxacin (CIPRO) 500 MG tablet    Sig: Take 1 tablet (500 mg total) by mouth 2 (two) times daily.    Dispense:  14 tablet    Refill:  0    Labs or Scans Ordered: No orders of the defined types were placed in this encounter.   Management:: Discussed with Dr Thornton Papas radiology who suggests CT scan of the abdomen and pelvis for delineation of urachal cyst/abscess  Follow up Return in about 2 weeks (around 07/31/2017) for Follow up, with Dr Elonda Husky.      All questions were answered.

## 2017-07-19 DIAGNOSIS — Z1283 Encounter for screening for malignant neoplasm of skin: Secondary | ICD-10-CM | POA: Diagnosis not present

## 2017-07-19 DIAGNOSIS — L570 Actinic keratosis: Secondary | ICD-10-CM | POA: Diagnosis not present

## 2017-07-19 DIAGNOSIS — D225 Melanocytic nevi of trunk: Secondary | ICD-10-CM | POA: Diagnosis not present

## 2017-07-19 DIAGNOSIS — Z85820 Personal history of malignant melanoma of skin: Secondary | ICD-10-CM | POA: Diagnosis not present

## 2017-07-19 DIAGNOSIS — X32XXXD Exposure to sunlight, subsequent encounter: Secondary | ICD-10-CM | POA: Diagnosis not present

## 2017-07-19 DIAGNOSIS — B078 Other viral warts: Secondary | ICD-10-CM | POA: Diagnosis not present

## 2017-07-19 DIAGNOSIS — Z08 Encounter for follow-up examination after completed treatment for malignant neoplasm: Secondary | ICD-10-CM | POA: Diagnosis not present

## 2017-07-31 ENCOUNTER — Ambulatory Visit: Payer: PRIVATE HEALTH INSURANCE | Admitting: Obstetrics & Gynecology

## 2017-08-02 ENCOUNTER — Ambulatory Visit (HOSPITAL_COMMUNITY)
Admission: RE | Admit: 2017-08-02 | Discharge: 2017-08-02 | Disposition: A | Discharge status: Home / Self Care | Payer: PRIVATE HEALTH INSURANCE | Source: Ambulatory Visit | Attending: Obstetrics & Gynecology | Admitting: Obstetrics & Gynecology

## 2017-08-02 DIAGNOSIS — Z90710 Acquired absence of both cervix and uterus: Secondary | ICD-10-CM | POA: Insufficient documentation

## 2017-08-02 DIAGNOSIS — N8110 Cystocele, unspecified: Secondary | ICD-10-CM | POA: Diagnosis not present

## 2017-08-02 DIAGNOSIS — R1084 Generalized abdominal pain: Secondary | ICD-10-CM | POA: Diagnosis not present

## 2017-08-02 DIAGNOSIS — N8189 Other female genital prolapse: Secondary | ICD-10-CM | POA: Insufficient documentation

## 2017-08-02 DIAGNOSIS — K5730 Diverticulosis of large intestine without perforation or abscess without bleeding: Secondary | ICD-10-CM | POA: Diagnosis not present

## 2017-08-07 ENCOUNTER — Ambulatory Visit (INDEPENDENT_AMBULATORY_CARE_PROVIDER_SITE_OTHER): Payer: PRIVATE HEALTH INSURANCE | Admitting: Obstetrics & Gynecology

## 2017-08-07 ENCOUNTER — Encounter: Payer: Self-pay | Admitting: Obstetrics & Gynecology

## 2017-08-07 VITALS — BP 121/75 | HR 79 | Ht 66.0 in | Wt 229.0 lb

## 2017-08-07 DIAGNOSIS — L02216 Cutaneous abscess of umbilicus: Secondary | ICD-10-CM | POA: Diagnosis not present

## 2017-08-07 NOTE — Progress Notes (Signed)
Follow up appointment for results  Chief Complaint  Patient presents with  . Follow-up    Blood pressure 121/75, pulse 79, height 5\' 6"  (1.676 m), weight 229 lb (103.9 kg).  CLINICAL DATA:  Paraumbilical pain and discharge for 1 month.  EXAM: CT ABDOMEN AND PELVIS WITHOUT CONTRAST  TECHNIQUE: Multidetector CT imaging of the abdomen and pelvis was performed following the standard protocol without IV contrast.  COMPARISON:  04/29/2013  FINDINGS: Lower chest: No acute findings.  Hepatobiliary: No mass visualized on this unenhanced exam. Gallbladder is unremarkable.  Pancreas: No mass or inflammatory process visualized on this unenhanced exam.  Spleen:  Within normal limits in size.  Adrenals/Urinary tract: No evidence of urolithiasis or hydronephrosis. Pelvic floor laxity is noted with mild to moderate cystocele.  Stomach/Bowel: No evidence of obstruction, inflammatory process, or abnormal fluid collections. Diverticulosis is seen mainly involving the sigmoid colon, however there is no evidence of diverticulitis.  Vascular/Lymphatic: No pathologically enlarged lymph nodes identified. No evidence of abdominal aortic aneurysm. Aortic atherosclerosis.  Reproductive: Prior hysterectomy noted. Adnexal regions are unremarkable in appearance.  Other: No evidence of paraumbilical hernia, mass, fluid collection, or inflammatory changes.  Musculoskeletal:  No suspicious bone lesions identified.  IMPRESSION: No evidence of paraumbilical hernia, mass, or fluid collection.  Mild sigmoid diverticulosis, without radiographic evidence of diverticulitis or other acute findings.  Pelvic floor laxity, with mild-to-moderate cystocele.   Electronically Signed   By: Earle Gell M.D.   On: 08/02/2017 16:23    MEDS ordered this encounter: No orders of the defined types were placed in this encounter.   Orders for this encounter: No orders of the defined  types were placed in this encounter.   Impression: Urachitis/abscess: resolved discharge erythema and pain  Plan: CT scan is negative for a urachal cyst or other peri umbilical or mass between bladder and umbilicus  Complete resolution of discahrge odor and pain on completed course of cipro  Pt to contact us if symptoms return  Follow Up: Return if symptoms worsen or fail to improve, for yearly with Jenn.       Face to face time:  10 minutes  Greater than 50% of the visit time was spent in counseling and coordination of care with the patient.  The summary and outline of the counseling and care coordination is summarized in the note above.   All questions were answered.  Past Medical History:  Diagnosis Date  . Encounter for monitoring postmenopausal estrogen replacement therapy 03/24/2013  . High cholesterol   . OAB (overactive bladder)   . UI (urinary incontinence) 03/24/2013  . Umbilical hernia   . Urgency incontinence   . UTI (lower urinary tract infection) 07/25/2013    Past Surgical History:  Procedure Laterality Date  . ABDOMINAL HYSTERECTOMY    . bladder mesh    . BREAST REDUCTION SURGERY    . BREAST SURGERY     reduction  . CYSTOCELE REPAIR     cystocele and rectocele  . KNEE SURGERY    . lt knee surgery    . NASAL SINUS SURGERY    . ROTATOR CUFF REPAIR    . TOE SURGERY    . TONSILLECTOMY      OB History    Gravida  2   Para  2   Term      Preterm      AB      Living  2     SAB      TAB  Ectopic      Multiple      Live Births              Allergies  Allergen Reactions  . Benadryl [Diphenhydramine Hcl]   . Penicillins Itching and Rash    Social History   Socioeconomic History  . Marital status: Divorced    Spouse name: Not on file  . Number of children: Not on file  . Years of education: Not on file  . Highest education level: Not on file  Occupational History  . Not on file  Social Needs  . Financial  resource strain: Not on file  . Food insecurity:    Worry: Not on file    Inability: Not on file  . Transportation needs:    Medical: Not on file    Non-medical: Not on file  Tobacco Use  . Smoking status: Former Smoker    Types: Cigarettes    Last attempt to quit: 02/28/1988    Years since quitting: 29.4  . Smokeless tobacco: Never Used  Substance and Sexual Activity  . Alcohol use: No    Comment: occ.  . Drug use: No  . Sexual activity: Not Currently    Birth control/protection: Surgical  Lifestyle  . Physical activity:    Days per week: Not on file    Minutes per session: Not on file  . Stress: Not on file  Relationships  . Social connections:    Talks on phone: Not on file    Gets together: Not on file    Attends religious service: Not on file    Active member of club or organization: Not on file    Attends meetings of clubs or organizations: Not on file    Relationship status: Not on file  Other Topics Concern  . Not on file  Social History Narrative  . Not on file    Family History  Problem Relation Age of Onset  . Diabetes Mother   . Cancer Mother        lung  . Cancer Father        lung  . Cancer Sister        lung  . Lymphoma Sister   . Diabetes Maternal Aunt   . Diabetes Paternal Grandmother   . Early death Paternal Grandmother   . Diabetes Other

## 2017-08-10 ENCOUNTER — Encounter (HOSPITAL_COMMUNITY): Payer: Self-pay | Admitting: Physical Therapy

## 2017-08-10 NOTE — Therapy (Signed)
[place], [place], [address] Phone: [phone]   Fax:  [phone]  Patient Details  Name: Stacy Herrera MRN: [id number] Date of Birth: 1945-06-22 Referring Provider:  No ref. provider found  Encounter Date: 08/10/2017  PHYSICAL THERAPY DISCHARGE SUMMARY  Visits from Start of Care: 1  Current functional level related to goals / functional outcomes: Unknown patient did not return to physical therapy after evaluation on 05/02/17.    Remaining deficits: Unknown patient did not return to physical therapy after evaluation on 05/02/17.    Education / Equipment: Patient was educated on initial HEP, on plan of care and examination findings, however unable to provide further education as patient did not return after evaluation on 05/02/17. See evaluation note on that date for more detail.  Plan: Patient agrees to discharge.  Patient goals were not met. Patient is being discharged due to not returning since the last visit.  ?????         Clarene Critchley PT, DPT 8:53 AM, 08/10/17 [place], [place], [address] Phone: [phone]   Fax:  [phone]

## 2017-08-29 ENCOUNTER — Encounter: Payer: Self-pay | Admitting: Internal Medicine

## 2017-08-29 DIAGNOSIS — M48062 Spinal stenosis, lumbar region with neurogenic claudication: Secondary | ICD-10-CM | POA: Diagnosis not present

## 2017-08-29 DIAGNOSIS — R030 Elevated blood-pressure reading, without diagnosis of hypertension: Secondary | ICD-10-CM | POA: Diagnosis not present

## 2017-08-29 DIAGNOSIS — M5416 Radiculopathy, lumbar region: Secondary | ICD-10-CM | POA: Diagnosis not present

## 2017-08-29 DIAGNOSIS — Z6838 Body mass index (BMI) 38.0-38.9, adult: Secondary | ICD-10-CM | POA: Diagnosis not present

## 2017-09-07 ENCOUNTER — Telehealth: Payer: Self-pay

## 2017-09-07 NOTE — Telephone Encounter (Signed)
-----   Message from Daneil Dolin, MD sent at 09/07/2017  9:43 AM EDT ----- Might schedule me a little extra time with her. I'm not sure what to do differently otherwise. Sometimes just another day and another approach new can make it. ----- Message ----- From: Claudina Lick, LPN Sent: 1/75/1025   9:24 AM To: Daneil Dolin, MD  Dr.Rourk- this pt is coming in July 31st to be triaged for a 10 year tcs. Her last tcs was incomplete and I have copied the findings:   "FINDINGS:  Digital rectal exam revealed no abnormalities.  Endoscopic  findings:  Prep was good.  Colon:  Colonic mucosa was surveyed well into  the rectosigmoid and only into the descending segment.  Unfortunately, I  again encountered recurrent looping and a degree of noncompliance of  left colon, which ultimately precluded advancement to the cecum in spite  of taking quite a while changing the patient's position, applying  external abdominal pressure to get around.  Ultimately, I was unable to  do so and further efforts were not felt to be fruitful; therefore, I  backed off and inspected the mucosal surfaces that were seen again.  I  pulled the scope down into the rectum where a thorough examination of  rectal mucosa including retroflexed view of anal verge demonstrated no  abnormalities.  The patient tolerated the procedure well and was  reactive to endoscopy."   She did a ACBE and it was negative.   This was in 2009 and I dont have your recommendations for her next tcs, do we need to do something different this time? Or should I just schedule her ?

## 2017-09-12 DIAGNOSIS — E782 Mixed hyperlipidemia: Secondary | ICD-10-CM | POA: Diagnosis not present

## 2017-09-12 DIAGNOSIS — R7309 Other abnormal glucose: Secondary | ICD-10-CM | POA: Diagnosis not present

## 2017-09-12 DIAGNOSIS — E6609 Other obesity due to excess calories: Secondary | ICD-10-CM | POA: Diagnosis not present

## 2017-09-12 DIAGNOSIS — Z6836 Body mass index (BMI) 36.0-36.9, adult: Secondary | ICD-10-CM | POA: Diagnosis not present

## 2017-09-12 DIAGNOSIS — E039 Hypothyroidism, unspecified: Secondary | ICD-10-CM | POA: Diagnosis not present

## 2017-09-12 DIAGNOSIS — Z0000 Encounter for general adult medical examination without abnormal findings: Secondary | ICD-10-CM | POA: Diagnosis not present

## 2017-09-12 DIAGNOSIS — J302 Other seasonal allergic rhinitis: Secondary | ICD-10-CM | POA: Diagnosis not present

## 2017-09-12 DIAGNOSIS — I10 Essential (primary) hypertension: Secondary | ICD-10-CM | POA: Diagnosis not present

## 2017-09-12 DIAGNOSIS — Z1389 Encounter for screening for other disorder: Secondary | ICD-10-CM | POA: Diagnosis not present

## 2017-09-26 ENCOUNTER — Ambulatory Visit (INDEPENDENT_AMBULATORY_CARE_PROVIDER_SITE_OTHER): Payer: Self-pay

## 2017-09-26 DIAGNOSIS — Z1211 Encounter for screening for malignant neoplasm of colon: Secondary | ICD-10-CM

## 2017-09-26 MED ORDER — PEG 3350-KCL-NA BICARB-NACL 420 G PO SOLR: 4000.0000 mL | ORAL | 0 refills | Status: DC

## 2017-09-26 NOTE — Progress Notes (Signed)
Gastroenterology Pre-Procedure Review  Request Date:09/26/17 Requesting Physician: 10 year recall Last tcs 09/30/2007- RMR- normal but incomplete. See phone note, RMR requesting extra time  PATIENT REVIEW QUESTIONS: The patient responded to the following health history questions as indicated:    1. Diabetes Melitis: no 2. Joint replacements in the past 12 months: no 3. Major health problems in the past 3 months: no 4. Has an artificial valve or MVP: no 5. Has a defibrillator: no 6. Has been advised in past to take antibiotics in advance of a procedure like teeth cleaning: yes (when going to the dentist- partial knee replacement 2 years ago) 7. Family history of colon cancer: no  8. Alcohol Use: no 9. History of sleep apnea: no  10. History of coronary artery or other vascular stents placed within the last 12 months: no 11. History of any prior anesthesia complications: no    MEDICATIONS & ALLERGIES:    Patient reports the following regarding taking any blood thinners:   Plavix? no Aspirin? no Coumadin? no Brilinta? no Xarelto? no Eliquis? no Pradaxa? no Savaysa? no Effient? no  Patient confirms/reports the following medications:  Current Outpatient Medications  Medication Sig Dispense Refill  . Calcium Carbonate-Vit D-Min (CALCIUM 1200 PO) Take by mouth.    . fish oil-omega-3 fatty acids 1000 MG capsule Take 2 g by mouth daily.    Marland Kitchen glucosamine-chondroitin 500-400 MG tablet Take 2 tablets by mouth at bedtime.    Marland Kitchen levothyroxine (SYNTHROID, LEVOTHROID) 50 MCG tablet Take 50 mcg by mouth daily before breakfast.    . Multiple Vitamin (MULITIVITAMIN WITH MINERALS) TABS Take 1 tablet by mouth at bedtime.    . simvastatin (ZOCOR) 20 MG tablet TAKE 1 TABLET BY MOUTH EVERY EVENING FOR CHOLESTEROL 30 tablet 11   No current facility-administered medications for this visit.     Patient confirms/reports the following allergies:  Allergies  Allergen Reactions  . Benadryl  [Diphenhydramine Hcl]   . Penicillins Itching and Rash    No orders of the defined types were placed in this encounter.   AUTHORIZATION INFORMATION Primary Insurance: medicare,  ID #: [id number] Pre-Cert / Josem Kaufmann required: no   SCHEDULE INFORMATION: Procedure has been scheduled as follows:  Date: 11/07/17, Time: 10:00  Location: [facility] Dr.Rourk  This Gastroenterology Pre-Precedure Review Form is being routed to the following provider(s): Walden Field NP

## 2017-09-26 NOTE — Patient Instructions (Signed)
Stacy Herrera   10/28/1945 MRN: [id number]    Procedure Date: 11/07/17 Time to register: 9:00am Place to register: [facility] Procedure Time: 10:00am Scheduled provider: R. Garfield Cornea, MD  PREPARATION FOR COLONOSCOPY WITH TRI-LYTE SPLIT PREP  Please notify us immediately if you are diabetic, take iron supplements, or if you are on Coumadin or any other blood thinners.     You will need to purchase 1 fleet enema and 1 box of Bisacodyl '5mg'$  tablets.   2 DAYS BEFORE PROCEDURE:  DATE: 11/05/17   DAY: Monday Begin clear liquid diet AFTER your lunch meal. NO SOLID FOODS after this point.  1 DAY BEFORE PROCEDURE:  DATE: 11/06/17   DAY: Tuesday Continue clear liquids the entire day - NO SOLID FOOD.     At 2:00 pm:  Take 2 Bisacodyl tablets.   At 4:00pm:  Start drinking your solution. Make sure you mix well per instructions on the bottle. Try to drink 1 (one) 8 ounce glass every 10-15 minutes until you have consumed HALF the jug. You should complete by 6:00pm.You must keep the left over solution refrigerated until completed next day.  Continue clear liquids. You must drink plenty of clear liquids to prevent dehyration and kidney failure.     DAY OF PROCEDURE:   DATE: 11/07/17   DAY: Wednesday If you take medications for your heart, blood pressure or breathing, you may take these medications.    Five hours before your procedure time '@5'$ :00am:  Finish remaining amout of bowel prep, drinking 1 (one) 8 ounce glass every 10-15 minutes until complete. You have two hours to consume remaining prep.   Three hours before your procedure time '@7'$ :00am:  Nothing by mouth.   At least one hour before going to the hospital:  Give yourself one Fleet enema. You may take your morning medications with sip of water unless we have instructed otherwise.      Please see below for Dietary Information.  CLEAR LIQUIDS INCLUDE:  Water Jello (NOT red in color)   Ice Popsicles (NOT red in  color)   Tea (sugar ok, no milk/cream) Powdered fruit flavored drinks  Coffee (sugar ok, no milk/cream) Gatorade/ Lemonade/ Kool-Aid  (NOT red in color)   Juice: apple, white grape, white cranberry Soft drinks  Clear bullion, consomme, broth (fat free beef/chicken/vegetable)  Carbonated beverages (any kind)  Strained chicken noodle soup Hard Candy   Remember: Clear liquids are liquids that will allow you to see your fingers on the other side of a clear glass. Be sure liquids are NOT red in color, and not cloudy, but CLEAR.  DO NOT EAT OR DRINK ANY OF THE FOLLOWING:  Dairy products of any kind   Cranberry juice Tomato juice / V8 juice   Grapefruit juice Orange juice     Red grape juice  Do not eat any solid foods, including such foods as: cereal, oatmeal, yogurt, fruits, vegetables, creamed soups, eggs, bread, crackers, pureed foods in a blender, etc.   HELPFUL HINTS FOR DRINKING PREP SOLUTION:   Make sure prep is extremely cold. Mix and refrigerate the the morning of the prep. You may also put in the freezer.   You may try mixing some Crystal Light or Country Time Lemonade if you prefer. Mix in small amounts; add more if necessary.  Try drinking through a straw  Rinse mouth with water or a mouthwash between glasses, to remove after-taste.  Try sipping on a cold beverage /ice/ popsicles between glasses  of prep.  Place a piece of sugar-free hard candy in mouth between glasses.  If you become nauseated, try consuming smaller amounts, or stretch out the time between glasses. Stop for 30-60 minutes, then slowly start back drinking.        OTHER INSTRUCTIONS  You will need a responsible adult at least 71 years of age to accompany you and drive you home. This person must remain in the waiting room during your procedure. The hospital will cancel your procedure if you do not have a responsible adult with you.   1. Wear loose fitting clothing that is easily removed. 2. Leave jewelry  and other valuables at home.  3. Remove all body piercing jewelry and leave at home. 4. Total time from sign-in until discharge is approximately 2-3 hours. 5. You should go home directly after your procedure and rest. You can resume normal activities the day after your procedure. 6. The day of your procedure you should not:  Drive  Make legal decisions  Operate machinery  Drink alcohol  Return to work   You may call the office (Dept: [phone]) before 5:00pm, or page the doctor on call [phone]) after 5:00pm, for further instructions, if necessary.   Insurance Information YOU WILL NEED TO CHECK WITH YOUR INSURANCE COMPANY FOR THE BENEFITS OF COVERAGE YOU HAVE FOR THIS PROCEDURE.  UNFORTUNATELY, NOT ALL INSURANCE COMPANIES HAVE BENEFITS TO COVER ALL OR PART OF THESE TYPES OF PROCEDURES.  IT IS YOUR RESPONSIBILITY TO CHECK YOUR BENEFITS, HOWEVER, WE WILL BE GLAD TO ASSIST YOU WITH ANY CODES YOUR INSURANCE COMPANY MAY NEED.    PLEASE NOTE THAT MOST INSURANCE COMPANIES WILL NOT COVER A SCREENING COLONOSCOPY FOR PEOPLE UNDER THE AGE OF 50  IF YOU HAVE BCBS INSURANCE, YOU MAY HAVE BENEFITS FOR A SCREENING COLONOSCOPY BUT IF POLYPS ARE FOUND THE DIAGNOSIS WILL CHANGE AND THEN YOU MAY HAVE A DEDUCTIBLE THAT WILL NEED TO BE MET. SO PLEASE MAKE SURE YOU CHECK YOUR BENEFITS FOR A SCREENING COLONOSCOPY AS WELL AS A DIAGNOSTIC COLONOSCOPY.

## 2017-09-28 NOTE — Progress Notes (Signed)
Ok to schedule.

## 2017-10-11 DIAGNOSIS — Z1283 Encounter for screening for malignant neoplasm of skin: Secondary | ICD-10-CM | POA: Diagnosis not present

## 2017-10-11 DIAGNOSIS — C44311 Basal cell carcinoma of skin of nose: Secondary | ICD-10-CM | POA: Diagnosis not present

## 2017-10-11 DIAGNOSIS — Z08 Encounter for follow-up examination after completed treatment for malignant neoplasm: Secondary | ICD-10-CM | POA: Diagnosis not present

## 2017-10-11 DIAGNOSIS — L820 Inflamed seborrheic keratosis: Secondary | ICD-10-CM | POA: Diagnosis not present

## 2017-10-11 DIAGNOSIS — Z85820 Personal history of malignant melanoma of skin: Secondary | ICD-10-CM | POA: Diagnosis not present

## 2017-10-25 DIAGNOSIS — Z6837 Body mass index (BMI) 37.0-37.9, adult: Secondary | ICD-10-CM | POA: Diagnosis not present

## 2017-10-25 DIAGNOSIS — J301 Allergic rhinitis due to pollen: Secondary | ICD-10-CM | POA: Diagnosis not present

## 2017-10-25 DIAGNOSIS — J019 Acute sinusitis, unspecified: Secondary | ICD-10-CM | POA: Diagnosis not present

## 2017-10-31 DIAGNOSIS — M5416 Radiculopathy, lumbar region: Secondary | ICD-10-CM | POA: Diagnosis not present

## 2017-10-31 DIAGNOSIS — M48062 Spinal stenosis, lumbar region with neurogenic claudication: Secondary | ICD-10-CM | POA: Diagnosis not present

## 2017-11-07 ENCOUNTER — Encounter (HOSPITAL_COMMUNITY): Payer: Self-pay | Admitting: *Deleted

## 2017-11-07 ENCOUNTER — Ambulatory Visit (HOSPITAL_COMMUNITY)
Admission: RE | Admit: 2017-11-07 | Discharge: 2017-11-07 | Disposition: A | Discharge status: Home Health | Payer: Medicare Other | Source: Ambulatory Visit | Attending: Internal Medicine | Admitting: Internal Medicine

## 2017-11-07 ENCOUNTER — Encounter (HOSPITAL_COMMUNITY)
Admission: RE | Disposition: A | Discharge status: Home Health | Payer: Self-pay | Source: Ambulatory Visit | Attending: Internal Medicine

## 2017-11-07 ENCOUNTER — Other Ambulatory Visit: Payer: Self-pay

## 2017-11-07 DIAGNOSIS — Z5309 Procedure and treatment not carried out because of other contraindication: Secondary | ICD-10-CM | POA: Insufficient documentation

## 2017-11-07 DIAGNOSIS — Z880 Allergy status to penicillin: Secondary | ICD-10-CM | POA: Insufficient documentation

## 2017-11-07 DIAGNOSIS — Z833 Family history of diabetes mellitus: Secondary | ICD-10-CM | POA: Diagnosis not present

## 2017-11-07 DIAGNOSIS — Z79899 Other long term (current) drug therapy: Secondary | ICD-10-CM | POA: Diagnosis not present

## 2017-11-07 DIAGNOSIS — K5730 Diverticulosis of large intestine without perforation or abscess without bleeding: Secondary | ICD-10-CM | POA: Insufficient documentation

## 2017-11-07 DIAGNOSIS — Z1211 Encounter for screening for malignant neoplasm of colon: Secondary | ICD-10-CM | POA: Insufficient documentation

## 2017-11-07 DIAGNOSIS — Z807 Family history of other malignant neoplasms of lymphoid, hematopoietic and related tissues: Secondary | ICD-10-CM | POA: Insufficient documentation

## 2017-11-07 DIAGNOSIS — Z87891 Personal history of nicotine dependence: Secondary | ICD-10-CM | POA: Diagnosis not present

## 2017-11-07 DIAGNOSIS — Z888 Allergy status to other drugs, medicaments and biological substances status: Secondary | ICD-10-CM | POA: Diagnosis not present

## 2017-11-07 DIAGNOSIS — E7800 Pure hypercholesterolemia, unspecified: Secondary | ICD-10-CM | POA: Diagnosis not present

## 2017-11-07 DIAGNOSIS — Z538 Procedure and treatment not carried out for other reasons: Secondary | ICD-10-CM | POA: Diagnosis not present

## 2017-11-07 DIAGNOSIS — N3941 Urge incontinence: Secondary | ICD-10-CM | POA: Insufficient documentation

## 2017-11-07 DIAGNOSIS — Z801 Family history of malignant neoplasm of trachea, bronchus and lung: Secondary | ICD-10-CM | POA: Diagnosis not present

## 2017-11-07 DIAGNOSIS — N3281 Overactive bladder: Secondary | ICD-10-CM | POA: Insufficient documentation

## 2017-11-07 DIAGNOSIS — Q438 Other specified congenital malformations of intestine: Secondary | ICD-10-CM | POA: Diagnosis not present

## 2017-11-07 HISTORY — DX: Pure hypercholesterolemia, unspecified: E78.00

## 2017-11-07 HISTORY — PX: COLONOSCOPY: SHX5424

## 2017-11-07 SURGERY — COLONOSCOPY
Anesthesia: Moderate Sedation

## 2017-11-07 MED ORDER — STERILE WATER FOR IRRIGATION IR SOLN
Status: DC | PRN
  Administered 2017-11-07: 1.5 mL

## 2017-11-07 MED ORDER — ONDANSETRON HCL 4 MG/2ML IJ SOLN
INTRAMUSCULAR | Status: AC
  Filled 2017-11-07: qty 2

## 2017-11-07 MED ORDER — SODIUM CHLORIDE 0.9 % IV SOLN
INTRAVENOUS | Status: DC
  Administered 2017-11-07: 09:00:00 via INTRAVENOUS

## 2017-11-07 MED ORDER — MEPERIDINE HCL 100 MG/ML IJ SOLN
INTRAMUSCULAR | Status: DC | PRN
  Administered 2017-11-07: 15 mg via INTRAVENOUS
  Administered 2017-11-07: 25 mg via INTRAVENOUS
  Administered 2017-11-07: 10 mg via INTRAVENOUS

## 2017-11-07 MED ORDER — MIDAZOLAM HCL 5 MG/5ML IJ SOLN
INTRAMUSCULAR | Status: DC | PRN
  Administered 2017-11-07: 2 mg via INTRAVENOUS
  Administered 2017-11-07 (×2): 1 mg via INTRAVENOUS
  Administered 2017-11-07: 2 mg via INTRAVENOUS
  Administered 2017-11-07: 1 mg via INTRAVENOUS

## 2017-11-07 MED ORDER — MIDAZOLAM HCL 5 MG/5ML IJ SOLN
INTRAMUSCULAR | Status: AC
  Filled 2017-11-07: qty 10

## 2017-11-07 MED ORDER — ONDANSETRON HCL 4 MG/2ML IJ SOLN
INTRAMUSCULAR | Status: DC | PRN
  Administered 2017-11-07: 4 mg via INTRAVENOUS

## 2017-11-07 MED ORDER — MEPERIDINE HCL 50 MG/ML IJ SOLN
INTRAMUSCULAR | Status: AC
  Filled 2017-11-07: qty 1

## 2017-11-07 NOTE — H&P (Signed)
@LOGO @   Primary Care Physician:  Sharilyn Sites, MD Primary Gastroenterologist:  Dr. Gala Romney  Pre-Procedure History & Physical: HPI:  Stacy Herrera is a 72 y.o. female is here for a screening colonoscopy. Average risk screening examination.  Incomplete colonoscopy previously.  Redundant colon.  Significant pelvic "dip" on ACBE.  Past Medical History:  Diagnosis Date  . Encounter for monitoring postmenopausal estrogen replacement therapy 03/24/2013  . High cholesterol   . Hypercholesteremia   . OAB (overactive bladder)   . UI (urinary incontinence) 03/24/2013  . Umbilical hernia   . Urgency incontinence   . UTI (lower urinary tract infection) 07/25/2013    Past Surgical History:  Procedure Laterality Date  . ABDOMINAL HYSTERECTOMY    . bladder mesh    . BREAST REDUCTION SURGERY    . BREAST SURGERY     reduction  . CYSTOCELE REPAIR     cystocele and rectocele  . KNEE SURGERY    . lt knee surgery    . NASAL SINUS SURGERY    . ROTATOR CUFF REPAIR    . TOE SURGERY    . TONSILLECTOMY      Prior to Admission medications   Medication Sig Start Date End Date Taking? Authorizing Provider  Calcium Carbonate-Vit D-Min (CALCIUM 1200 PO) Take 1 tablet by mouth 2 (two) times daily.    Yes [provider]  fish oil-omega-3 fatty acids 1000 MG capsule Take 1 g by mouth daily.    Yes [provider]  glucosamine-chondroitin 500-400 MG tablet Take 1 tablet by mouth daily.    Yes [provider]  levothyroxine (SYNTHROID, LEVOTHROID) 50 MCG tablet Take 50 mcg by mouth daily before breakfast.   Yes [provider]  Multiple Vitamin (MULITIVITAMIN WITH MINERALS) TABS Take 1 tablet by mouth daily.    Yes [provider]  polyethylene glycol-electrolytes (TRILYTE) 420 g solution Take 4,000 mLs by mouth as directed. 09/26/17  Yes Carlis Stable, NP  simvastatin (ZOCOR) 20 MG tablet TAKE 1 TABLET BY MOUTH EVERY EVENING FOR CHOLESTEROL Patient taking  differently: Take 20 mg by mouth every evening.  04/08/17  Yes Florian Buff, MD    Allergies as of 09/26/2017 - Review Complete 09/26/2017  Allergen Reaction Noted  . Benadryl [diphenhydramine hcl]  05/29/2012  . Penicillins Itching and Rash 04/03/2011    Family History  Problem Relation Age of Onset  . Diabetes Mother   . Cancer Mother        lung  . Cancer Father        lung  . Cancer Sister        lung  . Lymphoma Sister   . Diabetes Maternal Aunt   . Diabetes Paternal Grandmother   . Early death Paternal Grandmother   . Diabetes Other   . Colon cancer Neg Hx     Social History   Socioeconomic History  . Marital status: Divorced    Spouse name: Not on file  . Number of children: Not on file  . Years of education: Not on file  . Highest education level: Not on file  Occupational History  . Not on file  Social Needs  . Financial resource strain: Not on file  . Food insecurity:    Worry: Not on file    Inability: Not on file  . Transportation needs:    Medical: Not on file    Non-medical: Not on file  Tobacco Use  . Smoking status: Former Smoker  Types: Cigarettes    Last attempt to quit: 02/28/1988    Years since quitting: 29.7  . Smokeless tobacco: Never Used  Substance and Sexual Activity  . Alcohol use: No    Comment: occ.  . Drug use: No  . Sexual activity: Not Currently    Birth control/protection: Surgical  Lifestyle  . Physical activity:    Days per week: Not on file    Minutes per session: Not on file  . Stress: Not on file  Relationships  . Social connections:    Talks on phone: Not on file    Gets together: Not on file    Attends religious service: Not on file    Active member of club or organization: Not on file    Attends meetings of clubs or organizations: Not on file    Relationship status: Not on file  . Intimate partner violence:    Fear of current or ex partner: Not on file    Emotionally abused: Not on file    Physically  abused: Not on file    Forced sexual activity: Not on file  Other Topics Concern  . Not on file  Social History Narrative  . Not on file    Review of Systems: See HPI, otherwise negative ROS  Physical Exam: BP 126/69   Pulse 80   Temp 97.8 F (36.6 C) (Oral)   Resp (!) 23   Ht 5\' 9"  (1.753 m)   Wt 99.8 kg   SpO2 98%   BMI 32.49 kg/m  General:   Alert,  Well-developed, well-nourished, pleasant and cooperative in NAD Abdomen:  Soft, nontender and nondistended. No masses, hepatosplenomegaly or hernias noted. Normal bowel sounds, without guarding, and without rebound.     Impression/Plan: Stacy Herrera is now here to undergo a screening colonoscopy.  History of colon redundancy and incomplete colonoscopy previously. I told patient I'll make my best effort to reach the cecum but could not guarantee.  Risks, benefits, limitations, imponderables and alternatives regarding colonoscopy have been reviewed with the patient. Questions have been answered. All parties agreeable.     Notice:  This dictation was prepared with Dragon dictation along with smaller phrase technology. Any transcriptional errors that result from this process are unintentional and may not be corrected upon review.

## 2017-11-07 NOTE — Discharge Instructions (Signed)
Colonoscopy Discharge Instructions  Read the instructions outlined below and refer to this sheet in the next few weeks. These discharge instructions provide you with general information on caring for yourself after you leave the hospital. Your doctor may also give you specific instructions. While your treatment has been planned according to the most current medical practices available, unavoidable complications occasionally occur. If you have any problems or questions after discharge, call Dr. Gala Romney at [phone]. ACTIVITY  You may resume your regular activity, but move at a slower pace for the next 24 hours.   Take frequent rest periods for the next 24 hours.   Walking will help get rid of the air and reduce the bloated feeling in your belly (abdomen).   No driving for 24 hours (because of the medicine (anesthesia) used during the test).    Do not sign any important legal documents or operate any machinery for 24 hours (because of the anesthesia used during the test).  NUTRITION  Drink plenty of fluids.   You may resume your normal diet as instructed by your doctor.   Begin with a light meal and progress to your normal diet. Heavy or fried foods are harder to digest and may make you feel sick to your stomach (nauseated).   Avoid alcoholic beverages for 24 hours or as instructed.  MEDICATIONS  You may resume your normal medications unless your doctor tells you otherwise.  WHAT YOU CAN EXPECT TODAY  Some feelings of bloating in the abdomen.   Passage of more gas than usual.   Spotting of blood in your stool or on the toilet paper.  IF YOU HAD POLYPS REMOVED DURING THE COLONOSCOPY:  No aspirin products for 7 days or as instructed.   No alcohol for 7 days or as instructed.   Eat a soft diet for the next 24 hours.  FINDING OUT THE RESULTS OF YOUR TEST Not all test results are available during your visit. If your test results are not back during the visit, make an appointment  with your caregiver to find out the results. Do not assume everything is normal if you have not heard from your caregiver or the medical facility. It is important for you to follow up on all of your test results.  SEEK IMMEDIATE MEDICAL ATTENTION IF:  You have more than a spotting of blood in your stool.   Your belly is swollen (abdominal distention).   You are nauseated or vomiting.   You have a temperature over 101.   You have abdominal pain or discomfort that is severe or gets worse throughout the day.    Your colonoscopy could not be completed today.  Diverticulosis information provided.  Air-contrast barium enema to image the part of your colon which could not be seen today.  Further recommendations to follow.   Diverticulosis Diverticulosis is a condition that develops when small pouches (diverticula) form in the wall of the large intestine (colon). The colon is where water is absorbed and stool is formed. The pouches form when the inside layer of the colon pushes through weak spots in the outer layers of the colon. You may have a few pouches or many of them. What are the causes? The cause of this condition is not known. What increases the risk? The following factors may make you more likely to develop this condition:  Being older than age 61. Your risk for this condition increases with age. Diverticulosis is rare among people younger than age 30. By age  80, many people have it.  Eating a low-fiber diet.  Having frequent constipation.  Being overweight.  Not getting enough exercise.  Smoking.  Taking over-the-counter pain medicines, like aspirin and ibuprofen.  Having a family history of diverticulosis.  What are the signs or symptoms? In most people, there are no symptoms of this condition. If you do have symptoms, they may include:  Bloating.  Cramps in the abdomen.  Constipation or diarrhea.  Pain in the lower left side of the abdomen.  How is this  diagnosed? This condition is most often diagnosed during an exam for other colon problems. Because diverticulosis usually has no symptoms, it often cannot be diagnosed independently. This condition may be diagnosed by:  Using a flexible scope to examine the colon (colonoscopy).  Taking an X-ray of the colon after dye has been put into the colon (barium enema).  Doing a CT scan.  How is this treated? You may not need treatment for this condition if you have never developed an infection related to diverticulosis. If you have had an infection before, treatment may include:  Eating a high-fiber diet. This may include eating more fruits, vegetables, and grains.  Taking a fiber supplement.  Taking a live bacteria supplement (probiotic).  Taking medicine to relax your colon.  Taking antibiotic medicines.  Follow these instructions at home:  Drink 6-8 glasses of water or more each day to prevent constipation.  Try not to strain when you have a bowel movement.  If you have had an infection before: ? Eat more fiber as directed by your health care provider or your diet and nutrition specialist (dietitian). ? Take a fiber supplement or probiotic, if your health care provider approves.  Take over-the-counter and prescription medicines only as told by your health care provider.  If you were prescribed an antibiotic, take it as told by your health care provider. Do not stop taking the antibiotic even if you start to feel better.  Keep all follow-up visits as told by your health care provider. This is important. Contact a health care provider if:  You have pain in your abdomen.  You have bloating.  You have cramps.  You have not had a bowel movement in 3 days. Get help right away if:  Your pain gets worse.  Your bloating becomes very bad.  You have a fever or chills, and your symptoms suddenly get worse.  You vomit.  You have bowel movements that are bloody or black.  You  have bleeding from your rectum. Summary  Diverticulosis is a condition that develops when small pouches (diverticula) form in the wall of the large intestine (colon).  You may have a few pouches or many of them.  This condition is most often diagnosed during an exam for other colon problems.  If you have had an infection related to diverticulosis, treatment may include increasing the fiber in your diet, taking supplements, or taking medicines. This information is not intended to replace advice given to you by your health care provider. Make sure you discuss any questions you have with your health care provider. Document Released: 11/11/2003 Document Revised: 01/03/2016 Document Reviewed: 01/03/2016 Elsevier Interactive Patient Education  2017 [employer].

## 2017-11-07 NOTE — Op Note (Signed)
[facility] Patient Name: Stacy Herrera Procedure Date: 11/07/2017 9:40 AM MRN: [id number] Date of Birth: Sep 02, 1945 Attending MD: Norvel Richards , MD CSN: [id number] Age: 72 Admit Type: Outpatient Procedure:                Colonoscopy Indications:              Screening for colorectal malignant neoplasm Providers:                Norvel Richards, MD, Rosina Lowenstein, RN, Nelma Rothman, Technician Referring MD:              Medicines:                Midazolam 8 mg IV, Meperidine 50 mg IV Complications:            No immediate complications. Estimated Blood Loss:     Estimated blood loss: none. Procedure:                Pre-Anesthesia Assessment:                           - Prior to the procedure, a History and Physical                            was performed, and patient medications and                            allergies were reviewed. The patient's tolerance of                            previous anesthesia was also reviewed. The risks                            and benefits of the procedure and the sedation                            options and risks were discussed with the patient.                            All questions were answered, and informed consent                            was obtained. Prior Anticoagulants: The patient has                            taken no previous anticoagulant or antiplatelet                            agents. ASA Grade Assessment: II - A patient with                            mild systemic disease. After reviewing the risks  and benefits, the patient was deemed in                            satisfactory condition to undergo the procedure.                           After obtaining informed consent, the colonoscope                            was passed under direct vision. Throughout the                            procedure, the patient's blood pressure, pulse, and              oxygen saturations were monitored continuously. The                            procedure was aborted. The colonscope was not                            inserted. Medications were given. The colonoscopy                            was unusually difficult due to significant looping. Scope In: 9:58:04 AM Scope Out: 10:28:25 AM Total Procedure Duration: 0 hours 30 minutes 21 seconds  Findings:      The perianal and digital rectal examinations were normal.      Scattered medium-mouthed diverticula were found in the sigmoid colon and       descending colon. scope advanced into the distal transverse colon.       Entered the previously known pelvic "dip". Recurrent looping noted here.       In spite of changing of the patient's position and multiple blood       applications of abdominal pressure and utilizing scope guide, I was       unable to overcome the loops and advance scope toward the cecum. After       multiple attempts, the procedure was aborted. Impression:               - Diverticulosis in the sigmoid colon and in the                            descending colon.                           - Incomplete colonoscopy as outlined above. Moderate Sedation:      Moderate (conscious) sedation was administered by the endoscopy nurse       and supervised by the endoscopist. The following parameters were       monitored: oxygen saturation, heart rate, blood pressure, respiratory       rate, EKG, adequacy of pulmonary ventilation, and response to care.       Total physician intraservice time was 38 minutes. Recommendation:           - Patient has a contact number available for  emergencies. The signs and symptoms of potential                            delayed complications were discussed with the                            patient. Return to normal activities tomorrow.                            Written discharge instructions were provided to the                             patient.                           - Resume previous diet.                           - No repeat colonoscopy due to age. Air-contrast                            barium enema to complement today's incomplete                            examination.                           - Return to GI clinic (date not yet determined). Procedure Code(s):         Diagnosis Code(s):        --- Professional ---                           Z12.11, Encounter for screening for malignant                            neoplasm of colon                           K57.30, Diverticulosis of large intestine without                            perforation or abscess without bleeding CPT copyright 2017 American Medical Association. All rights reserved. The codes documented in this report are preliminary and upon coder review may  be revised to meet current compliance requirements. Cristopher Estimable. Jessah Danser, MD Norvel Richards, MD 11/07/2017 10:38:46 AM This report has been signed electronically. Number of Addenda: 0

## 2017-11-13 ENCOUNTER — Encounter (HOSPITAL_COMMUNITY): Payer: Self-pay | Admitting: Internal Medicine

## 2017-11-29 DIAGNOSIS — Z85820 Personal history of malignant melanoma of skin: Secondary | ICD-10-CM | POA: Diagnosis not present

## 2017-11-29 DIAGNOSIS — L821 Other seborrheic keratosis: Secondary | ICD-10-CM | POA: Diagnosis not present

## 2017-11-29 DIAGNOSIS — Z08 Encounter for follow-up examination after completed treatment for malignant neoplasm: Secondary | ICD-10-CM | POA: Diagnosis not present

## 2017-11-29 DIAGNOSIS — L820 Inflamed seborrheic keratosis: Secondary | ICD-10-CM | POA: Diagnosis not present

## 2017-11-29 DIAGNOSIS — Z85828 Personal history of other malignant neoplasm of skin: Secondary | ICD-10-CM | POA: Diagnosis not present

## 2017-11-29 DIAGNOSIS — Z1283 Encounter for screening for malignant neoplasm of skin: Secondary | ICD-10-CM | POA: Diagnosis not present

## 2018-01-02 DIAGNOSIS — M4317 Spondylolisthesis, lumbosacral region: Secondary | ICD-10-CM | POA: Diagnosis not present

## 2018-01-02 DIAGNOSIS — M48062 Spinal stenosis, lumbar region with neurogenic claudication: Secondary | ICD-10-CM | POA: Diagnosis not present

## 2018-01-02 DIAGNOSIS — M5416 Radiculopathy, lumbar region: Secondary | ICD-10-CM | POA: Diagnosis not present

## 2018-01-02 DIAGNOSIS — M48061 Spinal stenosis, lumbar region without neurogenic claudication: Secondary | ICD-10-CM | POA: Diagnosis not present

## 2018-01-02 DIAGNOSIS — Z6838 Body mass index (BMI) 38.0-38.9, adult: Secondary | ICD-10-CM | POA: Diagnosis not present

## 2018-01-03 ENCOUNTER — Other Ambulatory Visit: Payer: Self-pay | Admitting: Neurosurgery

## 2018-01-04 ENCOUNTER — Other Ambulatory Visit (HOSPITAL_COMMUNITY): Payer: Self-pay | Admitting: Neurosurgery

## 2018-01-04 DIAGNOSIS — M4317 Spondylolisthesis, lumbosacral region: Secondary | ICD-10-CM

## 2018-01-22 DIAGNOSIS — M7731 Calcaneal spur, right foot: Secondary | ICD-10-CM | POA: Diagnosis not present

## 2018-01-22 DIAGNOSIS — M722 Plantar fascial fibromatosis: Secondary | ICD-10-CM | POA: Diagnosis not present

## 2018-01-22 DIAGNOSIS — M79671 Pain in right foot: Secondary | ICD-10-CM | POA: Diagnosis not present

## 2018-01-22 DIAGNOSIS — E6609 Other obesity due to excess calories: Secondary | ICD-10-CM | POA: Diagnosis not present

## 2018-01-22 DIAGNOSIS — Z6835 Body mass index (BMI) 35.0-35.9, adult: Secondary | ICD-10-CM | POA: Diagnosis not present

## 2018-01-28 DIAGNOSIS — M71571 Other bursitis, not elsewhere classified, right ankle and foot: Secondary | ICD-10-CM | POA: Diagnosis not present

## 2018-01-28 DIAGNOSIS — M722 Plantar fascial fibromatosis: Secondary | ICD-10-CM | POA: Diagnosis not present

## 2018-03-04 ENCOUNTER — Ambulatory Visit (HOSPITAL_COMMUNITY)
Admission: RE | Admit: 2018-03-04 | Discharge: 2018-03-04 | Disposition: A | Discharge status: Home / Self Care | Payer: Medicare HMO | Source: Ambulatory Visit | Attending: Neurosurgery | Admitting: Neurosurgery

## 2018-03-04 DIAGNOSIS — M48061 Spinal stenosis, lumbar region without neurogenic claudication: Secondary | ICD-10-CM | POA: Diagnosis not present

## 2018-03-04 DIAGNOSIS — M4317 Spondylolisthesis, lumbosacral region: Secondary | ICD-10-CM | POA: Diagnosis not present

## 2018-03-05 NOTE — H&P (Signed)
Patient ID:   [id number] Patient: Stacy Herrera  Date of Birth: August 08, 1945 Visit Type: Office Visit   Date: 01/02/2018 01:45 PM Provider: Marchia Meiers. Vertell Limber MD   This 73 year old female presents for Follow Up of Back pain.  HISTORY OF PRESENT ILLNESS: 1.  Follow Up of Back pain  Stacy Herrera, 73 year old female and former patient of Dr. Cyndy Freeze and Dr. Annette Stable, visits for evaluation.  She recalls slipping on [facility] needles December 2018. Currently she reports low back pain with left leg pain to the knee.  She notes recently some right leg pain. Patient endorses that her pain has worsened since January. She also c/o difficulty standing for longer than 5 minutes.  Physical therapy offered no pain relief Chiropractic adjustment offered no pain relief  06/20/2017 left L5-S1 ESI by Dr. Brien Few offered only 3 weeks relief 08/29/2017 left L5-S1 ESI by Dr. Brien Few offered only 3 weeks relief 10/31/2017 left L5-S1 ESI by Dr. Brien Few offered only 3 weeks relief    Ibuprofen 800 mg b.i.d. Gabapentin 300 mg b.i.d.  History:  Hypercholesterolemia, hypothyroidism Surgical history:  Left partial knee replacement February 2018, hysterectomy 1988, left rotator cuff repair 1990  MRI and x-ray on [place]      Medical/Surgical/Interim History Reviewed, no change.  Last detailed document date:04/11/2017.     Family History: Reviewed, no changes.  Last detailed document date:04/11/2017.   Social History: Reviewed, no changes. Last detailed document date: 04/11/2017.    MEDICATIONS: (added, continued or stopped this visit) Started Medication Directions Instruction Stopped  calcium take 1 tablet by oral route  every day   04/11/2017 diclofenac sodium 75 mg tablet,delayed release take 1 tablet by oral route 2 times every day   04/11/2017 gabapentin 300 mg capsule take 1 capsule by oral route 3 times every day    GLUCOSAMINE SULFATE take 1 tablet by oral route  every  day    levothyroxine sodium take 1 tablet by oral route  every day   01/02/2018 Norco 5 mg-325 mg tablet take 1 tablet by oral route  every 6 hours as needed for pain    Omega 3-6-9 take 1 tablet by oral route  every day    simvastatin 20 mg tablet take 1 tablet by oral route  every day in the evening    Vitamin D3 take 1 tablet by oral route  every day      ALLERGIES: Ingredient Reaction Medication Name Comment NO KNOWN ALLERGIES    No known allergies.    PHYSICAL EXAM:  Vitals Date Temp F BP Pulse Ht In Wt Lb BMI BSA Pain Score 01/02/2018  114/81 87 66 213 34.38  10/10     IMPRESSION:  4-view L-spine X-ray reveals L4-5 Grade 1 to 2 spondylolisthesis - spondylolisthesis measurements: neutral-lateral 13 mm, extension 13 mm, 12.5 mm on flexion.  L-spine MRI reveals L4-5 spondylolisthesis, thickened ligaments and arthritis around the joints, left worse than right disc herniation at L4-5.  Upon examination, able to touch toes during toe touch, able to stand on heels and toes, full strength in BLE, negative SLR bilaterally, able to stand for 5 minutes maximum. Recommended new L-spine MRI without contrast to determine if any changes have occurred. Recommended surgical intervention - L4-5 MAS PLIF scheduled. Recommended weight reduction - discussed that degeneration can occur at other levels within the lumbar spine after surgery - especially if patient's weight remains stable.  A copy of patient's bone densitometry from last year needs to retrieved so that  it can be reviewed.  PLAN: 1. Nurse education given 2. LSO brace fitted 3. L4-5 MAS PLIF scheduled 4. L-spine MRI without contrast scheduled 5. Follow-up after surgery  Orders: Diagnostic Procedures: Assessment Procedure M43.17 MRI Spine/lumb W/o Contrast M48.062 Lumbar Spine- AP/Lat Z16.967 Lumbar Spine- AP/Lat/Flex/Ex Miscellaneous: Assessment  M48.062 Aspen Lo Sag  Rigid Panel Quick  Completed Orders (this encounter) Order Details Reason Side Interpretation Result Initial Treatment Date Region Lumbar Spine- AP/Lat/Flex/Ex      01/02/2018 All Levels to All Levels  Assessment/Plan  # Detail Type Description  1. Assessment Lumbar stenosis with neurogenic claudication (M48.062).  Plan Orders Aspen Lo Sag Rigid Panel Quick.     2. Assessment Spondylolisthesis, lumbosacral region (M43.17).          MEDICATIONS PRESCRIBED TODAY    Rx Quantity Refills NORCO 5 mg-325 mg  60 0           Provider:  Marchia Meiers. Vertell Limber MD  01/02/2018 09:02 PM Dictation edited by: Mirian Mo    CC Providers: Sharilyn Sites [facility] [address]  [place]  [id number]-   John Golding  [facility] [place], [place] [address]-               Electronically signed by Marchia Meiers. Vertell Limber MD on 01/03/2018 11:38 AM      PATIENT NAME:  Stacy Herrera, Stacy Herrera PATIENT ID:  [id number] DATE OF BIRTH:  12-Jun-1945 DATE OF VISIT:  04/11/2017 14:30:00  Patient MRN:  [id number] Provider ID:  [id number] Date:  04/11/2017 14:30:00 Description:  History and Physical Category:  In [place] presents to the [facility] for evaluation of back pain that has been going on for 6 months. She reports that, at night, her legs go numb. She has had a couple of IM steroid injections. She rarely takes NSAIDs. She denies weakness or numbness in her legs right now. She denies any bowel or bladder problems. She has not been to physical therapy.  PHYSICAL EXAM: She has full strength in the bilateral lower extremities. Sensation is intact to light touch. Reflexes are 2+. She ambulates normally. She has a little bit of tenderness over her lumbar spine.  IMAGING: I have independently reviewed a non-contrasted lumbar MRI. She has  normal lordosis, but has a grade 1 spondylolisthesis at L4-5. She has lateral recess and foraminal stenosis at L4-5 because of the spondylolisthesis. She does not have any other neural element compression. She does have some facet arthritis throughout her spine.  ASSESSMENT AND PLAN: Stacy Herrera has an L4-5 spondylolisthesis with radiculopathy. She is neurologically intact. I am going to refer her for physical therapy. I am also prescribing Diclofenac and Gabapentin. I will see her back in 4 weeks.     __________________________ Aldean Ast, MD   Job ID:  [id number] DD:  04/11/2017 08:36:55 DT:  04/11/2017 08:55:26 REVIEWED BY:  cdl  Electronically signed by Julien Girt. Guy Begin MD on 04/29/2017 02:25 PM  on behalf of Aldean Ast MD

## 2018-03-11 NOTE — Pre-Procedure Instructions (Signed)
Haylea Schlichting  03/11/2018      [id number] - [place], [place] [place] [address] Phone: [phone] Fax: [phone]    Your procedure is scheduled on January 21  Report to [place] at [place].M.  Call this number if you have problems the morning of surgery:  [phone]   Remember:  Do not eat or drink after midnight.    Take these medicines the morning of surgery with A SIP OF WATER  acetaminophen (TYLENOL) levothyroxine (SYNTHROID, LEVOTHROID)  7 days prior to surgery STOP taking any Aspirin (unless otherwise instructed by your surgeon), Aleve, Naproxen, Ibuprofen, Motrin, Advil, Goody's, BC's, all herbal medications, fish oil, and all vitamins.     Do not wear jewelry, make-up or nail polish.  Do not wear lotions, powders, or perfumes, or deodorant.  Do not shave 48 hours prior to surgery.    Do not bring valuables to the hospital.  [facility] is not responsible for any belongings or valuables.  Contacts, dentures or bridgework may not be worn into surgery.  Leave your suitcase in the car.  After surgery it may be brought to your room.  For patients admitted to the hospital, discharge time will be determined by your treatment team.  Patients discharged the day of surgery will not be allowed to drive home.    Special instructions:   [facility]- Preparing For Surgery  Before surgery, you can play an important role. Because skin is not sterile, your skin needs to be as free of germs as possible. You can reduce the number of germs on your skin by washing with CHG (chlorahexidine gluconate) Soap before surgery.  CHG is an antiseptic cleaner which kills germs and bonds with the skin to continue killing germs even after washing.    Oral Hygiene is also important to reduce your risk of infection.  Remember - BRUSH YOUR TEETH THE MORNING OF SURGERY WITH  YOUR REGULAR TOOTHPASTE  Please do not use if you have an allergy to CHG or antibacterial soaps. If your skin becomes reddened/irritated stop using the CHG.  Do not shave (including legs and underarms) for at least 48 hours prior to first CHG shower. It is OK to shave your face.  Please follow these instructions carefully.   1. Shower the NIGHT BEFORE SURGERY and the MORNING OF SURGERY with CHG.   2. If you chose to wash your hair, wash your hair first as usual with your normal shampoo.  3. After you shampoo, rinse your hair and body thoroughly to remove the shampoo.  4. Use CHG as you would any other liquid soap. You can apply CHG directly to the skin and wash gently with a scrungie or a clean washcloth.   5. Apply the CHG Soap to your body ONLY FROM THE NECK DOWN.  Do not use on open wounds or open sores. Avoid contact with your eyes, ears, mouth and genitals (private parts). Wash Face and genitals (private parts)  with your normal soap.  6. Wash thoroughly, paying special attention to the area where your surgery will be performed.  7. Thoroughly rinse your body with warm water from the neck down.  8. DO NOT shower/wash with your normal soap after using and rinsing off the CHG Soap.  9. Pat yourself dry with a CLEAN TOWEL.  10. Wear CLEAN PAJAMAS to  bed the night before surgery, wear comfortable clothes the morning of surgery  11. Place CLEAN SHEETS on your bed the night of your first shower and DO NOT SLEEP WITH PETS.    Day of Surgery:  Do not apply any deodorants/lotions.  Please wear clean clothes to the hospital/surgery center.   Remember to brush your teeth WITH YOUR REGULAR TOOTHPASTE.    Please read over the following fact sheets that you were given.

## 2018-03-12 ENCOUNTER — Other Ambulatory Visit: Payer: Self-pay

## 2018-03-12 ENCOUNTER — Encounter (HOSPITAL_COMMUNITY)
Admission: RE | Admit: 2018-03-12 | Discharge: 2018-03-12 | Disposition: A | Discharge status: Home / Self Care | Payer: Medicare HMO | Source: Ambulatory Visit | Attending: Neurosurgery | Admitting: Neurosurgery

## 2018-03-12 ENCOUNTER — Encounter (HOSPITAL_COMMUNITY): Payer: Self-pay

## 2018-03-12 DIAGNOSIS — Z01812 Encounter for preprocedural laboratory examination: Secondary | ICD-10-CM | POA: Insufficient documentation

## 2018-03-12 HISTORY — DX: Other complications of anesthesia, initial encounter: T88.59XA

## 2018-03-12 HISTORY — DX: Adverse effect of unspecified anesthetic, initial encounter: T41.45XA

## 2018-03-12 HISTORY — DX: Hypothyroidism, unspecified: E03.9

## 2018-03-12 LAB — SURGICAL PCR SCREEN
MRSA, PCR: NEGATIVE
Staphylococcus aureus: NEGATIVE

## 2018-03-12 LAB — CBC
HCT: 43.1 % (ref 36.0–46.0)
Hemoglobin: 13.5 g/dL (ref 12.0–15.0)
MCH: 27.4 pg (ref 26.0–34.0)
MCHC: 31.3 g/dL (ref 30.0–36.0)
MCV: 87.6 fL (ref 80.0–100.0)
Platelets: 384 10*3/uL (ref 150–400)
RBC: 4.92 MIL/uL (ref 3.87–5.11)
RDW: 13.6 % (ref 11.5–15.5)
WBC: 8.2 10*3/uL (ref 4.0–10.5)
nRBC: 0 % (ref 0.0–0.2)

## 2018-03-12 LAB — TYPE AND SCREEN
ABO/RH(D): O POS
Antibody Screen: NEGATIVE

## 2018-03-12 LAB — BASIC METABOLIC PANEL
Anion gap: 11 (ref 5–15)
BUN: 10 mg/dL (ref 8–23)
CO2: 25 mmol/L (ref 22–32)
CREATININE: 0.76 mg/dL (ref 0.44–1.00)
Calcium: 9.7 mg/dL (ref 8.9–10.3)
Chloride: 105 mmol/L (ref 98–111)
GFR calc Af Amer: >60 mL/min (ref >60)
GFR calc non Af Amer: >60 mL/min (ref >60)
GLUCOSE: 98 mg/dL (ref 70–99)
Potassium: 3.8 mmol/L (ref 3.5–5.1)
Sodium: 141 mmol/L (ref 135–145)

## 2018-03-12 LAB — ABO/RH: ABO/RH(D): O POS

## 2018-03-12 NOTE — Progress Notes (Signed)
PCP - Sharilyn Sites Cardiologist - denies  Chest x-ray - not needed EKG - not needed Stress Test - denies ECHO - denies Cardiac Cath - denies  Anesthesia review: NO  Patient denies shortness of breath, fever, cough and chest pain at PAT appointment   Patient verbalized understanding of instructions that were given to them at the PAT appointment. Patient was also instructed that they will need to review over the PAT instructions again at home before surgery.

## 2018-03-14 DIAGNOSIS — Z85820 Personal history of malignant melanoma of skin: Secondary | ICD-10-CM | POA: Diagnosis not present

## 2018-03-14 DIAGNOSIS — Z1283 Encounter for screening for malignant neoplasm of skin: Secondary | ICD-10-CM | POA: Diagnosis not present

## 2018-03-14 DIAGNOSIS — D225 Melanocytic nevi of trunk: Secondary | ICD-10-CM | POA: Diagnosis not present

## 2018-03-14 DIAGNOSIS — Z08 Encounter for follow-up examination after completed treatment for malignant neoplasm: Secondary | ICD-10-CM | POA: Diagnosis not present

## 2018-03-14 DIAGNOSIS — L820 Inflamed seborrheic keratosis: Secondary | ICD-10-CM | POA: Diagnosis not present

## 2018-03-18 NOTE — Anesthesia Preprocedure Evaluation (Addendum)
Anesthesia Evaluation  Patient identified by MRN, date of birth, ID band Patient awake    Reviewed: Allergy & Precautions, NPO status , Patient's Chart, lab work & pertinent test results  History of Anesthesia Complications (+) PROLONGED EMERGENCE and history of anesthetic complications  Airway Mallampati: II  TM Distance: >3 FB Neck ROM: Full    Dental  (+) Dental Advisory Given, Teeth Intact   Pulmonary former smoker,    breath sounds clear to auscultation       Cardiovascular Exercise Tolerance: Good negative cardio ROS   Rhythm:Regular Rate:Normal     Neuro/Psych negative neurological ROS  negative psych ROS   GI/Hepatic negative GI ROS, Neg liver ROS,   Endo/Other  Hypothyroidism  Obesity   Renal/GU negative Renal ROS Bladder dysfunction      Musculoskeletal negative musculoskeletal ROS (+)   Abdominal   Peds  Hematology negative hematology ROS (+)   Anesthesia Other Findings   Reproductive/Obstetrics                            Anesthesia Physical Anesthesia Plan  ASA: II  Anesthesia Plan: General   Post-op Pain Management:    Induction: Intravenous  PONV Risk Score and Plan: 4 or greater and Treatment may vary due to age or medical condition, Ondansetron, Dexamethasone and Propofol infusion  Airway Management Planned: Oral ETT  Additional Equipment: None  Intra-op Plan:   Post-operative Plan: Extubation in OR  Informed Consent: I have reviewed the patients History and Physical, chart, labs and discussed the procedure including the risks, benefits and alternatives for the proposed anesthesia with the patient or authorized representative who has indicated his/her understanding and acceptance.     Dental advisory given  Plan Discussed with: CRNA and Anesthesiologist  Anesthesia Plan Comments:        Anesthesia Quick Evaluation

## 2018-03-19 ENCOUNTER — Inpatient Hospital Stay (HOSPITAL_COMMUNITY): Payer: Medicare HMO | Admitting: Anesthesiology

## 2018-03-19 ENCOUNTER — Other Ambulatory Visit: Payer: Self-pay

## 2018-03-19 ENCOUNTER — Encounter (HOSPITAL_COMMUNITY)
Admission: RE | Disposition: A | Discharge status: Home / Self Care | Payer: Self-pay | Source: Home / Self Care | Attending: Neurosurgery

## 2018-03-19 ENCOUNTER — Inpatient Hospital Stay (HOSPITAL_COMMUNITY)
Admission: RE | Admit: 2018-03-19 | Discharge: 2018-03-20 | DRG: 455 | Disposition: A | Discharge status: Home / Self Care | Payer: Medicare HMO | Attending: Neurosurgery | Admitting: Neurosurgery

## 2018-03-19 ENCOUNTER — Encounter (HOSPITAL_COMMUNITY): Payer: Self-pay | Admitting: *Deleted

## 2018-03-19 ENCOUNTER — Inpatient Hospital Stay (HOSPITAL_COMMUNITY): Payer: Medicare HMO

## 2018-03-19 DIAGNOSIS — M7138 Other bursal cyst, other site: Secondary | ICD-10-CM | POA: Diagnosis present

## 2018-03-19 DIAGNOSIS — M48062 Spinal stenosis, lumbar region with neurogenic claudication: Principal | ICD-10-CM | POA: Diagnosis present

## 2018-03-19 DIAGNOSIS — Z96652 Presence of left artificial knee joint: Secondary | ICD-10-CM | POA: Diagnosis present

## 2018-03-19 DIAGNOSIS — E039 Hypothyroidism, unspecified: Secondary | ICD-10-CM | POA: Diagnosis present

## 2018-03-19 DIAGNOSIS — E7800 Pure hypercholesterolemia, unspecified: Secondary | ICD-10-CM | POA: Diagnosis present

## 2018-03-19 DIAGNOSIS — Z981 Arthrodesis status: Secondary | ICD-10-CM | POA: Diagnosis not present

## 2018-03-19 DIAGNOSIS — M5116 Intervertebral disc disorders with radiculopathy, lumbar region: Secondary | ICD-10-CM | POA: Diagnosis not present

## 2018-03-19 DIAGNOSIS — M4316 Spondylolisthesis, lumbar region: Secondary | ICD-10-CM | POA: Diagnosis present

## 2018-03-19 DIAGNOSIS — Z79899 Other long term (current) drug therapy: Secondary | ICD-10-CM

## 2018-03-19 DIAGNOSIS — Z419 Encounter for procedure for purposes other than remedying health state, unspecified: Secondary | ICD-10-CM

## 2018-03-19 HISTORY — PX: MAXIMUM ACCESS (MAS)POSTERIOR LUMBAR INTERBODY FUSION (PLIF) 1 LEVEL: SHX6368

## 2018-03-19 SURGERY — FOR MAXIMUM ACCESS (MAS) POSTERIOR LUMBAR INTERBODY FUSION (PLIF) 1 LEVEL
Anesthesia: General | Site: Back

## 2018-03-19 MED ORDER — DIAZEPAM 5 MG PO TABS
5.0000 mg | ORAL_TABLET | Freq: Once | ORAL | Status: AC
  Administered 2018-03-19: 5 mg via ORAL
  Filled 2018-03-19: qty 1

## 2018-03-19 MED ORDER — SODIUM CHLORIDE 0.9% FLUSH: 3.0000 mL | INTRAVENOUS | Status: DC | PRN

## 2018-03-19 MED ORDER — OXYCODONE HCL 5 MG PO TABS
10.0000 mg | ORAL_TABLET | ORAL | Status: DC | PRN
  Administered 2018-03-19 – 2018-03-20 (×8): 10 mg via ORAL
  Filled 2018-03-19 (×8): qty 2

## 2018-03-19 MED ORDER — ONDANSETRON HCL 4 MG/2ML IJ SOLN
INTRAMUSCULAR | Status: AC
  Filled 2018-03-19: qty 2

## 2018-03-19 MED ORDER — SODIUM CHLORIDE 0.9% FLUSH: 3.0000 mL | Freq: Two times a day (BID) | INTRAVENOUS | Status: DC

## 2018-03-19 MED ORDER — PROPOFOL 10 MG/ML IV BOLUS
INTRAVENOUS | Status: DC | PRN
  Administered 2018-03-19: 150 mg via INTRAVENOUS
  Administered 2018-03-19: 50 mg via INTRAVENOUS

## 2018-03-19 MED ORDER — MORPHINE SULFATE (PF) 2 MG/ML IV SOLN
2.0000 mg | INTRAVENOUS | Status: DC | PRN
  Administered 2018-03-19: 2 mg via INTRAVENOUS
  Filled 2018-03-19: qty 1

## 2018-03-19 MED ORDER — FENTANYL CITRATE (PF) 100 MCG/2ML IJ SOLN
INTRAMUSCULAR | Status: DC | PRN
  Administered 2018-03-19: 50 ug via INTRAVENOUS
  Administered 2018-03-19: 25 ug via INTRAVENOUS
  Administered 2018-03-19 (×2): 50 ug via INTRAVENOUS
  Administered 2018-03-19: 25 ug via INTRAVENOUS
  Administered 2018-03-19: 50 ug via INTRAVENOUS

## 2018-03-19 MED ORDER — CHLORHEXIDINE GLUCONATE CLOTH 2 % EX PADS: 6.0000 | MEDICATED_PAD | Freq: Once | CUTANEOUS | Status: DC

## 2018-03-19 MED ORDER — METHOCARBAMOL 1000 MG/10ML IJ SOLN
500.0000 mg | Freq: Four times a day (QID) | INTRAVENOUS | Status: DC | PRN
  Filled 2018-03-19: qty 5

## 2018-03-19 MED ORDER — FENTANYL CITRATE (PF) 100 MCG/2ML IJ SOLN
25.0000 ug | INTRAMUSCULAR | Status: DC | PRN
  Administered 2018-03-19 (×2): 25 ug via INTRAVENOUS
  Administered 2018-03-19 (×2): 50 ug via INTRAVENOUS

## 2018-03-19 MED ORDER — THROMBIN 5000 UNITS EX SOLR
OROMUCOSAL | Status: DC | PRN
  Administered 2018-03-19: 09:00:00 via TOPICAL

## 2018-03-19 MED ORDER — DOCUSATE SODIUM 100 MG PO CAPS
100.0000 mg | ORAL_CAPSULE | Freq: Two times a day (BID) | ORAL | Status: DC
  Administered 2018-03-19 – 2018-03-20 (×2): 100 mg via ORAL
  Filled 2018-03-19 (×2): qty 1

## 2018-03-19 MED ORDER — ONDANSETRON HCL 4 MG/2ML IJ SOLN
INTRAMUSCULAR | Status: DC | PRN
  Administered 2018-03-19: 4 mg via INTRAVENOUS

## 2018-03-19 MED ORDER — HYDROMORPHONE HCL 1 MG/ML IJ SOLN
0.5000 mg | Freq: Once | INTRAMUSCULAR | Status: AC
  Administered 2018-03-19: 0.5 mg via INTRAVENOUS
  Filled 2018-03-19: qty 0.5

## 2018-03-19 MED ORDER — FENTANYL CITRATE (PF) 250 MCG/5ML IJ SOLN
INTRAMUSCULAR | Status: AC
  Filled 2018-03-19: qty 5

## 2018-03-19 MED ORDER — LACTATED RINGERS IV SOLN
Freq: Once | INTRAVENOUS | Status: AC
  Administered 2018-03-19: 06:00:00 via INTRAVENOUS

## 2018-03-19 MED ORDER — DEXAMETHASONE SODIUM PHOSPHATE 10 MG/ML IJ SOLN
INTRAMUSCULAR | Status: DC | PRN
  Administered 2018-03-19: 10 mg via INTRAVENOUS

## 2018-03-19 MED ORDER — HYDROCODONE-ACETAMINOPHEN 5-325 MG PO TABS: 2.0000 | ORAL_TABLET | ORAL | Status: DC | PRN

## 2018-03-19 MED ORDER — PROPOFOL 10 MG/ML IV BOLUS
INTRAVENOUS | Status: AC
  Filled 2018-03-19: qty 20

## 2018-03-19 MED ORDER — ZOLPIDEM TARTRATE 5 MG PO TABS: 5.0000 mg | ORAL_TABLET | Freq: Every evening | ORAL | Status: DC | PRN

## 2018-03-19 MED ORDER — ROCURONIUM BROMIDE 50 MG/5ML IV SOSY
PREFILLED_SYRINGE | INTRAVENOUS | Status: AC
  Filled 2018-03-19: qty 5

## 2018-03-19 MED ORDER — FENTANYL CITRATE (PF) 100 MCG/2ML IJ SOLN
INTRAMUSCULAR | Status: AC
  Filled 2018-03-19: qty 2

## 2018-03-19 MED ORDER — LIDOCAINE-EPINEPHRINE 1 %-1:100000 IJ SOLN
INTRAMUSCULAR | Status: DC | PRN
  Administered 2018-03-19: 5 mL

## 2018-03-19 MED ORDER — ACETAMINOPHEN 500 MG PO TABS: 1000.0000 mg | ORAL_TABLET | Freq: Four times a day (QID) | ORAL | Status: DC | PRN

## 2018-03-19 MED ORDER — BUPIVACAINE HCL (PF) 0.5 % IJ SOLN
INTRAMUSCULAR | Status: DC | PRN
  Administered 2018-03-19: 5 mL

## 2018-03-19 MED ORDER — ROCURONIUM BROMIDE 100 MG/10ML IV SOLN
INTRAVENOUS | Status: DC | PRN
  Administered 2018-03-19: 50 mg via INTRAVENOUS

## 2018-03-19 MED ORDER — BUPIVACAINE LIPOSOME 1.3 % IJ SUSP
20.0000 mL | INTRAMUSCULAR | Status: AC
  Administered 2018-03-19: 20 mL
  Filled 2018-03-19: qty 20

## 2018-03-19 MED ORDER — LIDOCAINE-EPINEPHRINE 1 %-1:100000 IJ SOLN
INTRAMUSCULAR | Status: AC
  Filled 2018-03-19: qty 1

## 2018-03-19 MED ORDER — VITAMIN D 25 MCG (1000 UNIT) PO TABS
1000.0000 [IU] | ORAL_TABLET | Freq: Every day | ORAL | Status: DC
  Administered 2018-03-19 – 2018-03-20 (×2): 1000 [IU] via ORAL
  Filled 2018-03-19 (×3): qty 1

## 2018-03-19 MED ORDER — PROPOFOL 1000 MG/100ML IV EMUL
INTRAVENOUS | Status: AC
  Filled 2018-03-19: qty 100

## 2018-03-19 MED ORDER — PHENOL 1.4 % MT LIQD: 1.0000 | OROMUCOSAL | Status: DC | PRN

## 2018-03-19 MED ORDER — PANTOPRAZOLE SODIUM 40 MG IV SOLR
40.0000 mg | Freq: Every day | INTRAVENOUS | Status: DC
  Administered 2018-03-19: 40 mg via INTRAVENOUS
  Filled 2018-03-19: qty 40

## 2018-03-19 MED ORDER — OXYCODONE HCL 5 MG PO TABS
5.0000 mg | ORAL_TABLET | Freq: Once | ORAL | Status: AC | PRN
  Administered 2018-03-19: 5 mg via ORAL

## 2018-03-19 MED ORDER — OXYCODONE HCL 5 MG PO TABS: 5.0000 mg | ORAL_TABLET | ORAL | Status: DC | PRN

## 2018-03-19 MED ORDER — PROPOFOL 500 MG/50ML IV EMUL
INTRAVENOUS | Status: DC | PRN
  Administered 2018-03-19: 100 ug/kg/min via INTRAVENOUS

## 2018-03-19 MED ORDER — HYDROMORPHONE HCL 1 MG/ML IJ SOLN: 0.5000 mg | INTRAMUSCULAR | Status: DC | PRN

## 2018-03-19 MED ORDER — SIMVASTATIN 20 MG PO TABS
20.0000 mg | ORAL_TABLET | Freq: Every evening | ORAL | Status: DC
  Administered 2018-03-19: 20 mg via ORAL
  Filled 2018-03-19: qty 1

## 2018-03-19 MED ORDER — METHOCARBAMOL 500 MG PO TABS
500.0000 mg | ORAL_TABLET | Freq: Four times a day (QID) | ORAL | Status: DC | PRN
  Administered 2018-03-19: 500 mg via ORAL

## 2018-03-19 MED ORDER — ACETAMINOPHEN 650 MG RE SUPP: 650.0000 mg | RECTAL | Status: DC | PRN

## 2018-03-19 MED ORDER — MENTHOL 3 MG MT LOZG: 1.0000 | LOZENGE | OROMUCOSAL | Status: DC | PRN

## 2018-03-19 MED ORDER — METHOCARBAMOL 500 MG PO TABS
ORAL_TABLET | ORAL | Status: AC
  Filled 2018-03-19: qty 1

## 2018-03-19 MED ORDER — 0.9 % SODIUM CHLORIDE (POUR BTL) OPTIME
TOPICAL | Status: DC | PRN
  Administered 2018-03-19: 1000 mL

## 2018-03-19 MED ORDER — POLYETHYLENE GLYCOL 3350 17 G PO PACK: 17.0000 g | PACK | Freq: Every day | ORAL | Status: DC | PRN

## 2018-03-19 MED ORDER — SODIUM CHLORIDE 0.9 % IV SOLN: 250.0000 mL | INTRAVENOUS | Status: DC

## 2018-03-19 MED ORDER — METHOCARBAMOL 750 MG PO TABS
750.0000 mg | ORAL_TABLET | Freq: Four times a day (QID) | ORAL | Status: DC | PRN
  Administered 2018-03-19 – 2018-03-20 (×3): 750 mg via ORAL
  Filled 2018-03-19 (×3): qty 1

## 2018-03-19 MED ORDER — BUPIVACAINE HCL (PF) 0.5 % IJ SOLN
INTRAMUSCULAR | Status: AC
  Filled 2018-03-19: qty 30

## 2018-03-19 MED ORDER — VANCOMYCIN HCL IN DEXTROSE 1-5 GM/200ML-% IV SOLN
1000.0000 mg | Freq: Once | INTRAVENOUS | Status: AC
  Administered 2018-03-19: 1000 mg via INTRAVENOUS
  Filled 2018-03-19: qty 200

## 2018-03-19 MED ORDER — PROPOFOL 500 MG/50ML IV EMUL
INTRAVENOUS | Status: AC
  Filled 2018-03-19: qty 50

## 2018-03-19 MED ORDER — PROPOFOL 10 MG/ML IV BOLUS
INTRAVENOUS | Status: AC
  Filled 2018-03-19: qty 40

## 2018-03-19 MED ORDER — GLUCOSAMINE-CHONDROITIN 500-400 MG PO TABS: 2.0000 | ORAL_TABLET | Freq: Every day | ORAL | Status: DC

## 2018-03-19 MED ORDER — SODIUM CHLORIDE 0.9 % IV SOLN
INTRAVENOUS | Status: DC | PRN
  Administered 2018-03-19: 20 ug/min via INTRAVENOUS

## 2018-03-19 MED ORDER — ONDANSETRON HCL 4 MG/2ML IJ SOLN: 4.0000 mg | Freq: Once | INTRAMUSCULAR | Status: DC | PRN

## 2018-03-19 MED ORDER — ALUM & MAG HYDROXIDE-SIMETH 200-200-20 MG/5ML PO SUSP: 30.0000 mL | Freq: Four times a day (QID) | ORAL | Status: DC | PRN

## 2018-03-19 MED ORDER — THROMBIN 5000 UNITS EX SOLR
CUTANEOUS | Status: AC
  Filled 2018-03-19: qty 5000

## 2018-03-19 MED ORDER — ACETAMINOPHEN 500 MG PO TABS
1000.0000 mg | ORAL_TABLET | Freq: Four times a day (QID) | ORAL | Status: DC
  Administered 2018-03-19 – 2018-03-20 (×4): 1000 mg via ORAL
  Filled 2018-03-19 (×4): qty 2

## 2018-03-19 MED ORDER — LIDOCAINE HCL (CARDIAC) PF 100 MG/5ML IV SOSY
PREFILLED_SYRINGE | INTRAVENOUS | Status: DC | PRN
  Administered 2018-03-19: 80 mg via INTRAVENOUS

## 2018-03-19 MED ORDER — ACETAMINOPHEN 325 MG PO TABS: 650.0000 mg | ORAL_TABLET | ORAL | Status: DC | PRN

## 2018-03-19 MED ORDER — FLEET ENEMA 7-19 GM/118ML RE ENEM: 1.0000 | ENEMA | Freq: Once | RECTAL | Status: DC | PRN

## 2018-03-19 MED ORDER — CALCIUM CARBONATE-VITAMIN D 500-200 MG-UNIT PO TABS
1.0000 | ORAL_TABLET | Freq: Every day | ORAL | Status: DC
  Administered 2018-03-20: 1 via ORAL
  Filled 2018-03-19: qty 1

## 2018-03-19 MED ORDER — LIDOCAINE 2% (20 MG/ML) 5 ML SYRINGE
INTRAMUSCULAR | Status: AC
  Filled 2018-03-19: qty 5

## 2018-03-19 MED ORDER — BISACODYL 10 MG RE SUPP: 10.0000 mg | Freq: Every day | RECTAL | Status: DC | PRN

## 2018-03-19 MED ORDER — ONDANSETRON HCL 4 MG/2ML IJ SOLN: 4.0000 mg | Freq: Four times a day (QID) | INTRAMUSCULAR | Status: DC | PRN

## 2018-03-19 MED ORDER — MIDAZOLAM HCL 2 MG/2ML IJ SOLN
INTRAMUSCULAR | Status: AC
  Filled 2018-03-19: qty 2

## 2018-03-19 MED ORDER — KCL IN DEXTROSE-NACL 20-5-0.45 MEQ/L-%-% IV SOLN
INTRAVENOUS | Status: DC
  Administered 2018-03-19: 13:00:00 via INTRAVENOUS
  Filled 2018-03-19: qty 1000

## 2018-03-19 MED ORDER — OXYCODONE HCL 5 MG PO TABS
ORAL_TABLET | ORAL | Status: AC
  Filled 2018-03-19: qty 1

## 2018-03-19 MED ORDER — LEVOTHYROXINE SODIUM 25 MCG PO TABS
50.0000 ug | ORAL_TABLET | Freq: Every day | ORAL | Status: DC
  Administered 2018-03-20: 50 ug via ORAL
  Filled 2018-03-19: qty 2

## 2018-03-19 MED ORDER — OXYCODONE HCL 5 MG/5ML PO SOLN: 5.0000 mg | Freq: Once | ORAL | Status: AC | PRN

## 2018-03-19 MED ORDER — ONDANSETRON HCL 4 MG PO TABS: 4.0000 mg | ORAL_TABLET | Freq: Four times a day (QID) | ORAL | Status: DC | PRN

## 2018-03-19 MED ORDER — VANCOMYCIN HCL IN DEXTROSE 1-5 GM/200ML-% IV SOLN
1000.0000 mg | INTRAVENOUS | Status: AC
  Administered 2018-03-19: 1000 mg via INTRAVENOUS
  Filled 2018-03-19: qty 200

## 2018-03-19 MED ORDER — DEXAMETHASONE SODIUM PHOSPHATE 10 MG/ML IJ SOLN
INTRAMUSCULAR | Status: AC
  Filled 2018-03-19: qty 1

## 2018-03-19 MED ORDER — LACTATED RINGERS IV SOLN
INTRAVENOUS | Status: DC | PRN
  Administered 2018-03-19: 07:00:00 via INTRAVENOUS

## 2018-03-19 MED ORDER — SUGAMMADEX SODIUM 200 MG/2ML IV SOLN
INTRAVENOUS | Status: DC | PRN
  Administered 2018-03-19: 100 mg via INTRAVENOUS

## 2018-03-19 SURGICAL SUPPLY — 84 items
BASKET BONE COLLECTION (BASKET) ×3 IMPLANT
BLADE CLIPPER SURG (BLADE) IMPLANT
BONE CANC CHIPS 20CC PCAN1/4 (Bone Implant) ×3 IMPLANT
BUR MATCHSTICK NEURO 3.0 LAGG (BURR) ×3 IMPLANT
BUR ROUND FLUTED 5 RND (BURR) ×2 IMPLANT
BUR ROUND FLUTED 5MM RND (BURR) ×1
CAGE COROENT MP 8X9X23M-8 SPIN (Cage) ×6 IMPLANT
CANISTER SUCT 3000ML PPV (MISCELLANEOUS) IMPLANT
CAP RELINE MOD TULIP RMM (Cap) ×6 IMPLANT
CARTRIDGE OIL MAESTRO DRILL (MISCELLANEOUS) ×1 IMPLANT
CHIPS CANC BONE 20CC PCAN1/4 (Bone Implant) ×1 IMPLANT
CLIP NEUROVISION LG (CLIP) ×3 IMPLANT
CONT SPEC 4OZ CLIKSEAL STRL BL (MISCELLANEOUS) ×3 IMPLANT
COVER BACK TABLE 60X90IN (DRAPES) ×3 IMPLANT
COVER WAND RF STERILE (DRAPES) IMPLANT
DECANTER SPIKE VIAL GLASS SM (MISCELLANEOUS) ×3 IMPLANT
DERMABOND ADVANCED (GAUZE/BANDAGES/DRESSINGS) ×2
DERMABOND ADVANCED .7 DNX12 (GAUZE/BANDAGES/DRESSINGS) ×1 IMPLANT
DIFFUSER DRILL AIR PNEUMATIC (MISCELLANEOUS) ×3 IMPLANT
DRAPE C-ARM 42X72 X-RAY (DRAPES) ×3 IMPLANT
DRAPE C-ARMOR (DRAPES) ×3 IMPLANT
DRAPE HALF SHEET 40X57 (DRAPES) ×3 IMPLANT
DRAPE LAPAROTOMY 100X72X124 (DRAPES) ×3 IMPLANT
DRAPE SURG 17X23 STRL (DRAPES) ×3 IMPLANT
DRSG OPSITE POSTOP 4X6 (GAUZE/BANDAGES/DRESSINGS) ×3 IMPLANT
DURAPREP 26ML APPLICATOR (WOUND CARE) ×3 IMPLANT
ELECT BLADE 4.0 EZ CLEAN MEGAD (MISCELLANEOUS) ×3
ELECT REM PT RETURN 9FT ADLT (ELECTROSURGICAL) ×3
ELECTRODE BLDE 4.0 EZ CLN MEGD (MISCELLANEOUS) ×1 IMPLANT
ELECTRODE REM PT RTRN 9FT ADLT (ELECTROSURGICAL) ×1 IMPLANT
EVACUATOR 1/8 PVC DRAIN (DRAIN) IMPLANT
GAUZE 4X4 16PLY RFD (DISPOSABLE) IMPLANT
GAUZE SPONGE 4X4 12PLY STRL (GAUZE/BANDAGES/DRESSINGS) ×3 IMPLANT
GLOVE BIO SURGEON STRL SZ7.5 (GLOVE) ×3 IMPLANT
GLOVE BIO SURGEON STRL SZ8 (GLOVE) ×6 IMPLANT
GLOVE BIOGEL PI IND STRL 7.0 (GLOVE) ×1 IMPLANT
GLOVE BIOGEL PI IND STRL 7.5 (GLOVE) ×1 IMPLANT
GLOVE BIOGEL PI IND STRL 8 (GLOVE) ×2 IMPLANT
GLOVE BIOGEL PI IND STRL 8.5 (GLOVE) ×2 IMPLANT
GLOVE BIOGEL PI INDICATOR 7.0 (GLOVE) ×2
GLOVE BIOGEL PI INDICATOR 7.5 (GLOVE) ×2
GLOVE BIOGEL PI INDICATOR 8 (GLOVE) ×4
GLOVE BIOGEL PI INDICATOR 8.5 (GLOVE) ×4
GLOVE ECLIPSE 8.0 STRL XLNG CF (GLOVE) ×6 IMPLANT
GLOVE EXAM NITRILE XL STR (GLOVE) IMPLANT
GOWN STRL REUS W/ TWL LRG LVL3 (GOWN DISPOSABLE) ×2 IMPLANT
GOWN STRL REUS W/ TWL XL LVL3 (GOWN DISPOSABLE) ×1 IMPLANT
GOWN STRL REUS W/TWL 2XL LVL3 (GOWN DISPOSABLE) ×6 IMPLANT
GOWN STRL REUS W/TWL LRG LVL3 (GOWN DISPOSABLE) ×4
GOWN STRL REUS W/TWL XL LVL3 (GOWN DISPOSABLE) ×2
HEMOSTAT POWDER KIT SURGIFOAM (HEMOSTASIS) ×3 IMPLANT
KIT BASIN OR (CUSTOM PROCEDURE TRAY) ×3 IMPLANT
KIT POSITION SURG JACKSON T1 (MISCELLANEOUS) ×3 IMPLANT
KIT TURNOVER KIT B (KITS) ×3 IMPLANT
MILL MEDIUM DISP (BLADE) ×3 IMPLANT
MODULE NVM5 NEXT GEN EMG (NEEDLE) ×3 IMPLANT
NEEDLE HYPO 21X1.5 SAFETY (NEEDLE) ×3 IMPLANT
NEEDLE HYPO 25X1 1.5 SAFETY (NEEDLE) ×3 IMPLANT
NEEDLE SPNL 18GX3.5 QUINCKE PK (NEEDLE) IMPLANT
NS IRRIG 1000ML POUR BTL (IV SOLUTION) ×3 IMPLANT
OIL CARTRIDGE MAESTRO DRILL (MISCELLANEOUS) ×3
PACK LAMINECTOMY NEURO (CUSTOM PROCEDURE TRAY) ×3 IMPLANT
PAD ARMBOARD 7.5X6 YLW CONV (MISCELLANEOUS) ×9 IMPLANT
PATTIES SURGICAL .5 X.5 (GAUZE/BANDAGES/DRESSINGS) IMPLANT
PATTIES SURGICAL .5 X1 (DISPOSABLE) IMPLANT
PATTIES SURGICAL 1X1 (DISPOSABLE) IMPLANT
ROD RELINE COCR LORD 5.0X35 (Rod) ×6 IMPLANT
SCREW LOCK RSS 4.5/5.0MM (Screw) ×12 IMPLANT
SCREW RELINE RMM 5.5X35 4S (Screw) ×6 IMPLANT
SCREW SHANK RELINE MOD 5.0X30 (Screw) ×3 IMPLANT
SCREW SHANK RELINE MOD 5.0X35 (Screw) ×3 IMPLANT
SPONGE LAP 4X18 RFD (DISPOSABLE) IMPLANT
SPONGE SURGIFOAM ABS GEL 100 (HEMOSTASIS) IMPLANT
STAPLER SKIN PROX WIDE 3.9 (STAPLE) IMPLANT
SUT VIC AB 1 CT1 18XBRD ANBCTR (SUTURE) ×1 IMPLANT
SUT VIC AB 1 CT1 8-18 (SUTURE) ×2
SUT VIC AB 2-0 CT1 18 (SUTURE) ×3 IMPLANT
SUT VIC AB 3-0 SH 8-18 (SUTURE) ×6 IMPLANT
SYR 20CC LL (SYRINGE) ×3 IMPLANT
SYR 5ML LL (SYRINGE) IMPLANT
TOWEL GREEN STERILE (TOWEL DISPOSABLE) ×3 IMPLANT
TOWEL GREEN STERILE FF (TOWEL DISPOSABLE) ×3 IMPLANT
TRAY FOLEY MTR SLVR 16FR STAT (SET/KITS/TRAYS/PACK) ×3 IMPLANT
WATER STERILE IRR 1000ML POUR (IV SOLUTION) ×3 IMPLANT

## 2018-03-19 NOTE — Progress Notes (Signed)
Pharmacy Antibiotic Note  Stacy Herrera is a 73 y.o. female admitted on 03/19/2018 for planned spinal surgery. Pharmacy has been consulted for Vancomycin dosing post-op for surgical prophylaxis.   The patient received a dose of Vancomycin 1g pre-op around 0600 today. Per discussion with RN there is NO drain in place. SCr 0.76.   Plan: - Vancomycin 1g IV x 1 dose at 1800 today - Pharmacy will sign off as no additional doses expected at this time  Height: 5\' 6"  (167.6 cm) Weight: 212 lb 14.4 oz (96.6 kg) IBW/kg (Calculated) : 59.3  Temp (24hrs), Avg:97.7 F (36.5 C), Min:97.3 F (36.3 C), Max:98.4 F (36.9 C)  No results for input(s): WBC, CREATININE, LATICACIDVEN, VANCOTROUGH, VANCOPEAK, VANCORANDOM, GENTTROUGH, GENTPEAK, GENTRANDOM, TOBRATROUGH, TOBRAPEAK, TOBRARND, AMIKACINPEAK, AMIKACINTROU, AMIKACIN in the last 168 hours.  Estimated Creatinine Clearance: 75.6 mL/min (by C-G formula based on SCr of 0.76 mg/dL).    Allergies  Allergen Reactions  . Penicillins Itching, Rash and Other (See Comments)    PATIENT HAS HAD A PCN REACTION WITH IMMEDIATE RASH, FACIAL/TONGUE/THROAT SWELLING, SOB, OR LIGHTHEADEDNESS WITH HYPOTENSION:  #  #  YES  #  #  Has patient had a PCN reaction causing severe rash involving mucus membranes or skin necrosis: Unknown Has patient had a PCN reaction that required hospitalization: Unknown Has patient had a PCN reaction occurring within the last 10 years: Unknown If all of the above answers are "NO", then may proceed with Cephalosporin use.  . Benadryl [Diphenhydramine Hcl] Other (See Comments)    UNKNOWN     Alycia Rossetti, PharmD, BCPS Pager: [phone] 1:12 PM

## 2018-03-19 NOTE — Op Note (Addendum)
03/19/2018  10:43 AM  PATIENT:  Stacy Herrera  73 y.o. female  PRE-OPERATIVE DIAGNOSIS:  Lumbar stenosis with neurogenic claudication, lumbar spondylolisthesis, synovial cyst, herniated lumbar disc, lumbar radiculopathy, lumbago   POST-OPERATIVE DIAGNOSIS:  Lumbar stenosis with neurogenic claudication, lumbar spondylolisthesis, synovial cyst, herniated lumbar disc, lumbar radiculopathy, lumbago   PROCEDURE:  Procedure(s) with comments: Lumbar Four-Five Maximum access posterior lumbar interbody fusion with Resection of Synovial Cyst (N/A) - Lumbar Four-Five Maximum access posterior lumbar interbody fusion with PEEK cages, autograft, pedicle screw fixation, posterolateral arthrodesis with autograft and allograft  SURGEON:  Surgeon(s) and Role:    Erline Levine, MD - Primary    * Judith Part, MD - Assisting  PHYSICIAN ASSISTANT:   ASSISTANTS: Poteat, RN   ANESTHESIA:   general  EBL:  200 mL   BLOOD ADMINISTERED:none  DRAINS: none   LOCAL MEDICATIONS USED:  MARCAINE    and LIDOCAINE   SPECIMEN:  No Specimen  DISPOSITION OF SPECIMEN:  N/A  COUNTS:  YES  TOURNIQUET:  * No tourniquets in log *  DICTATION: Patient is a 73 year old with spondylosis , stenosis, spondylolisthesis, disc herniation and severe back and bilateral, right greater left lower extremity pain at L4/5 levels of the lumbar spine. It was elected to take her to surgery for MASPLIF L 45 level with posterolateral arthrodesis.  Procedure:   Following uncomplicated induction of GETA, and placement of electrodes for neural monitoring, patient was turned into a prone position on the [place] tableand using AP  fluoroscopy the area of planned incision was marked, prepped with betadine scrub and Duraprep, then draped. Exposure was performed of facet joint complex at L 45 level and the MAS retractor was placed.5.0 x 35 mm cortical Nuvasive screws were placed at L 4 on the right and 5.0 x 30 mm  On the left  according to standard landmarks using neural monitoring.  A total laminectomy of L 4 was then performed with disarticulation of facets.  This bone was saved for grafting, combined with Osteocel after being run through bone mill and was placed in bone packing device.  Thorough discectomy was performed bilaterally at L 45 and the endplates were prepared for grafting.  The synovial cyst was carefully dissected free from the dura and the cyst was removed.  23 x 8 x 8 degree cages were placed in the interspace and positioning was confirmed with AP and lateral fluoroscopy.  10 cc of autograft was packed in the interspace medial to the second cage.   Remaining screws were placed at L 5 (5.5 x 35) and 35 mm rods were placed.   And the screws were locked and torqued.Final Xrays showed well positioned implants and screw fixation. The posterolateral region was packed with remaining 15 cc of autograft on the right of midline and a similar amount of bone graft on the left. The wounds were irrigated and then closed with 1, 2-0 and 3-0 Vicryl stitches. Sterile occlusive dressing was placed with Dermabond. The patient was then extubated in the operating room and taken to recovery in stable and satisfactory condition having tolerated her operation well. Counts were correct at the end of the case.  PLAN OF CARE: Admit to inpatient   PATIENT DISPOSITION:  PACU - hemodynamically stable.   Delay start of Pharmacological VTE agent (>24hrs) due to surgical blood loss or risk of bleeding: yes

## 2018-03-19 NOTE — Interval H&P Note (Signed)
History and Physical Interval Note:  03/19/2018 7:26 AM  Stacy Herrera  has presented today for surgery, with the diagnosis of Lumbar stenosis with neurogenic claudication  The various methods of treatment have been discussed with the patient and family. After consideration of risks, benefits and other options for treatment, the patient has consented to  Procedure(s) with comments: Lumbar 4-5 Maximum access posterior lumbar interbody fusion with Resection of Synovial Cyst (N/A) - Lumbar 4-5 Maximum access posterior lumbar interbody fusion as a surgical intervention .  The patient's history has been reviewed, patient examined, no change in status, stable for surgery.  I have reviewed the patient's chart and labs.  Questions were answered to the patient's satisfaction.     Peggyann Shoals

## 2018-03-19 NOTE — Progress Notes (Signed)
Awake, alert, conversant.  MAEW with full strength.  Appropriately sore in low back.

## 2018-03-19 NOTE — Brief Op Note (Addendum)
03/19/2018  10:43 AM  PATIENT:  Stacy Herrera  73 y.o. female  PRE-OPERATIVE DIAGNOSIS:  Lumbar stenosis with neurogenic claudication, lumbar spondylolisthesis, synovial cyst, herniated lumbar disc, lumbar radiculopathy, lumbago   POST-OPERATIVE DIAGNOSIS:  Lumbar stenosis with neurogenic claudication, lumbar spondylolisthesis, synovial cyst, herniated lumbar disc, lumbar radiculopathy, lumbago   PROCEDURE:  Procedure(s) with comments: Lumbar Four-Five Maximum access posterior lumbar interbody fusion with Resection of Synovial Cyst (N/A) - Lumbar Four-Five Maximum access posterior lumbar interbody fusion with PEEK cages, autograft, pedicle screw fixation, posterolateral arthrodesis with autograft and allograft  SURGEON:  Surgeon(s) and Role:    Erline Levine, MD - Primary    * Judith Part, MD - Assisting  PHYSICIAN ASSISTANT:   ASSISTANTS: Poteat, RN   ANESTHESIA:   general  EBL:  200 mL   BLOOD ADMINISTERED:none  DRAINS: none   LOCAL MEDICATIONS USED:  MARCAINE    and LIDOCAINE   SPECIMEN:  No Specimen  DISPOSITION OF SPECIMEN:  N/A  COUNTS:  YES  TOURNIQUET:  * No tourniquets in log *  DICTATION: Patient is a 73 year old with spondylosis , stenosis, spondylolisthesis, disc herniation and severe back and bilateral, right greater left lower extremity pain at L4/5 levels of the lumbar spine. It was elected to take her to surgery for MASPLIF L 45 level with posterolateral arthrodesis.  Procedure:   Following uncomplicated induction of GETA, and placement of electrodes for neural monitoring, patient was turned into a prone position on the [place] tableand using AP  fluoroscopy the area of planned incision was marked, prepped with betadine scrub and Duraprep, then draped. Exposure was performed of facet joint complex at L 45 level and the MAS retractor was placed.5.0 x 35 mm cortical Nuvasive screws were placed at L 4 on the right and 5.0 x 30 mm  On the left  according to standard landmarks using neural monitoring.  A total laminectomy of L 4 was then performed with disarticulation of facets.  This bone was saved for grafting, combined with Osteocel after being run through bone mill and was placed in bone packing device.  Thorough discectomy was performed bilaterally at L 45 and the endplates were prepared for grafting. he synovial cyst was carefully dissected free from the dura and the cyst was removed. 23 x 8 x 8 degree cages were placed in the interspace and positioning was confirmed with AP and lateral fluoroscopy.  10 cc of autograft was packed in the interspace medial to the second cage.   Remaining screws were placed at L 5 (5.5 x 35) and 35 mm rods were placed.   And the screws were locked and torqued.Final Xrays showed well positioned implants and screw fixation. The posterolateral region was packed with remaining 15 cc of autograft on the right of midline and a similar amount of bone graft on the left. The wounds were irrigated and then closed with 1, 2-0 and 3-0 Vicryl stitches. Sterile occlusive dressing was placed with Dermabond. The patient was then extubated in the operating room and taken to recovery in stable and satisfactory condition having tolerated her operation well. Counts were correct at the end of the case.  PLAN OF CARE: Admit to inpatient   PATIENT DISPOSITION:  PACU - hemodynamically stable.   Delay start of Pharmacological VTE agent (>24hrs) due to surgical blood loss or risk of bleeding: yes

## 2018-03-19 NOTE — Progress Notes (Signed)
PHARMACIST - PHYSICIAN ORDER COMMUNICATION  CONCERNING: P&T Medication Policy on Herbal Medications  DESCRIPTION:  This patient's order for:  Glucosamine-Chondroitin  has been noted.  This product(s) is classified as an "herbal" or natural product. Due to a lack of definitive safety studies or FDA approval, nonstandard manufacturing practices, plus the potential risk of unknown drug-drug interactions while on inpatient medications, the Pharmacy and Therapeutics Committee does not permit the use of "herbal" or natural products of this type within [facility].   ACTION TAKEN: The pharmacy department is unable to verify this order at this time and your patient has been informed of this safety policy. Please reevaluate patient's clinical condition at discharge and address if the herbal or natural product(s) should be resumed at that time.  Alycia Rossetti, PharmD, BCPS Pager: [phone] 12:28 PM

## 2018-03-19 NOTE — Anesthesia Procedure Notes (Signed)
Procedure Name: Intubation Date/Time: 03/19/2018 7:41 AM Performed by: Audry Pili, MD Pre-anesthesia Checklist: Patient identified, Emergency Drugs available, Suction available, Patient being monitored and Timeout performed Patient Re-evaluated:Patient Re-evaluated prior to induction Oxygen Delivery Method: Circle system utilized Preoxygenation: Pre-oxygenation with 100% oxygen Induction Type: IV induction Ventilation: Mask ventilation without difficulty Laryngoscope Size: Mac and 3 Grade View: Grade I Tube type: Oral Tube size: 6.5 mm Number of attempts: 2 Airway Equipment and Method: Stylet Placement Confirmation: ETT inserted through vocal cords under direct vision,  positive ETCO2 and breath sounds checked- equal and bilateral Secured at: 21 cm Tube secured with: Tape

## 2018-03-19 NOTE — Evaluation (Signed)
Physical Therapy Evaluation Patient Details Name: Stacy Herrera MRN: [id number] DOB: Aug 24, 1945 Today's Date: 03/19/2018   History of Present Illness  Patient is a 73 yo female s/p Lumbar Four-Five Maximum access posterior lumbar interbody fusion with Resection of Synovial Cyst   Clinical Impression  Patient seen for mobility assessment s/p spinal surgery. Mobilizing well. Educated patient on precautions, mobility expectations, and safety. Will benefit from continued skilled PT to reinforce precautions and ensure safe mobility. Will see as indicated and progress as tolerated.      Follow Up Recommendations No PT follow up;Supervision/Assistance - 24 hour    Equipment Recommendations  3in1 (PT)    Recommendations for Other Services       Precautions / Restrictions Precautions Precautions: Back Precaution Booklet Issued: Yes (comment) Precaution Comments: verbally reviewed with patient Required Braces or Orthoses: Spinal Brace Spinal Brace: Lumbar corset(pt reports she has the brace at home) Restrictions Weight Bearing Restrictions: No      Mobility  Bed Mobility Overal bed mobility: Needs Assistance Bed Mobility: Rolling;Sidelying to Sit;Sit to Sidelying Rolling: Min assist Sidelying to sit: Min assist     Sit to sidelying: Min assist General bed mobility comments: min assist for all aspects of bed mobility. Increased time and effort. Cued for log roll technique throughout.   Transfers Overall transfer level: Needs assistance Equipment used: 1 person hand held assist Transfers: Sit to/from Stand Sit to Stand: Min assist         General transfer comment: min assist for stability, cues for posture and positioning during power up  Ambulation/Gait Ambulation/Gait assistance: Min assist;Min guard Gait Distance (Feet): 260 Feet Assistive device: 1 person hand held assist Gait Pattern/deviations: Step-through pattern;Decreased stride length;Drifts  right/left Gait velocity: decreased Gait velocity interpretation: <1.8 ft/sec, indicate of risk for recurrent falls General Gait Details: some instability with ambulation, VCs for increased candence to increased stability  Stairs            Wheelchair Mobility    Modified Rankin (Stroke Patients Only)       Balance Overall balance assessment: Mild deficits observed, not formally tested                                           Pertinent Vitals/Pain Pain Assessment: 0-10 Pain Score: 8  Pain Location: operative site Pain Descriptors / Indicators: Operative site guarding;Sore Pain Intervention(s): Monitored during session    Home Living Family/patient expects to be discharged to:: Private residence Living Arrangements: Children   Type of Home: House Home Access: Stairs to enter   [employer] of Steps: 3 Home Layout: Multi-level;Able to live on main level with bedroom/bathroom Home Equipment: Kasandra Knudsen - single point      Prior Function Level of Independence: Independent               Hand Dominance   Dominant Hand: Right    Extremity/Trunk Assessment   Upper Extremity Assessment Upper Extremity Assessment: Overall WFL for tasks assessed    Lower Extremity Assessment Lower Extremity Assessment: Overall WFL for tasks assessed    Cervical / Trunk Assessment Cervical / Trunk Assessment: (s/p spinal surgery)  Communication   Communication: No difficulties  Cognition Arousal/Alertness: Awake/alert Behavior During Therapy: WFL for tasks assessed/performed Overall Cognitive Status: Within Functional Limits for tasks assessed  General Comments      Exercises     Assessment/Plan    PT Assessment Patient needs continued PT services  PT Problem List Decreased strength;Decreased activity tolerance;Decreased balance;Decreased mobility;Decreased safety awareness;Decreased  knowledge of precautions;Pain       PT Treatment Interventions DME instruction;Gait training;Functional mobility training;Stair training;Therapeutic exercise;Therapeutic activities;Balance training;Patient/family education    PT Goals (Current goals can be found in the Care Plan section)  Acute Rehab PT Goals Patient Stated Goal: to go home PT Goal Formulation: With patient/family Time For Goal Achievement: 04/02/18 Potential to Achieve Goals: Good    Frequency Min 5X/week   Barriers to discharge        Co-evaluation               AM-PAC PT "6 Clicks" Mobility  Outcome Measure Help needed turning from your back to your side while in a flat bed without using bedrails?: A Little Help needed moving from lying on your back to sitting on the side of a flat bed without using bedrails?: A Little Help needed moving to and from a bed to a chair (including a wheelchair)?: A Little Help needed standing up from a chair using your arms (e.g., wheelchair or bedside chair)?: A Little Help needed to walk in hospital room?: A Little Help needed climbing 3-5 steps with a railing? : A Little 6 Click Score: 18    End of Session   Activity Tolerance: Patient tolerated treatment well;Patient limited by pain Patient left: in bed;with call bell/phone within reach;with family/visitor present;with SCD's reapplied Nurse Communication: Mobility status PT Visit Diagnosis: Unsteadiness on feet (R26.81);Difficulty in walking, not elsewhere classified (R26.2)    Time: 4239-5320 PT Time Calculation (min) (ACUTE ONLY): 22 min   Charges:   PT Evaluation $PT Eval Moderate Complexity: 1 Mod          Alben Deeds, PT DPT  Board Certified Neurologic Specialist Acute Rehabilitation Services Pager [phone] Office [place] 03/19/2018, 3:43 PM

## 2018-03-19 NOTE — Transfer of Care (Signed)
Immediate Anesthesia Transfer of Care Note  Patient: Stacy Herrera  Procedure(s) Performed: Lumbar Four-Five Maximum access posterior lumbar interbody fusion with Resection of Synovial Cyst (N/A Back)  Patient Location: PACU  Anesthesia Type:General  Level of Consciousness: awake  Airway & Oxygen Therapy: Patient Spontanous Breathing  Post-op Assessment: Report given to RN, Post -op Vital signs reviewed and stable and Patient moving all extremities  Post vital signs: stable  Last Vitals:  Vitals Value Taken Time  BP 109/52 03/19/2018 10:35 AM  Temp 36.3 C 03/19/2018 10:35 AM  Pulse 66 03/19/2018 10:38 AM  Resp 22 03/19/2018 10:38 AM  SpO2 98 % 03/19/2018 10:38 AM  Vitals shown include unvalidated device data.  Last Pain:  Vitals:   03/19/18 0558  TempSrc: Oral  PainSc: 8       Patients Stated Pain Goal: 3 (16/94/50 3888)  Complications: No apparent anesthesia complications

## 2018-03-19 NOTE — Anesthesia Postprocedure Evaluation (Signed)
Anesthesia Post Note  Patient: Stacy Herrera  Procedure(s) Performed: Lumbar Four-Five Maximum access posterior lumbar interbody fusion with Resection of Synovial Cyst (N/A Back)     Patient location during evaluation: PACU Anesthesia Type: General Level of consciousness: awake and alert Pain management: pain level controlled Vital Signs Assessment: post-procedure vital signs reviewed and stable Respiratory status: spontaneous breathing, nonlabored ventilation and respiratory function stable Cardiovascular status: blood pressure returned to baseline and stable Postop Assessment: no apparent nausea or vomiting Anesthetic complications: no    Last Vitals:  Vitals:   03/19/18 1152 03/19/18 1221  BP: 103/62 (!) 122/57  Pulse: 71 70  Resp: 11 20  Temp:    SpO2: 100% 98%                   Audry Pili

## 2018-03-20 ENCOUNTER — Encounter (HOSPITAL_COMMUNITY): Payer: Self-pay | Admitting: Neurosurgery

## 2018-03-20 MED ORDER — HYDROXYZINE HCL 50 MG/ML IM SOLN
50.0000 mg | Freq: Four times a day (QID) | INTRAMUSCULAR | Status: AC | PRN
  Administered 2018-03-20: 50 mg via INTRAMUSCULAR
  Filled 2018-03-20: qty 1

## 2018-03-20 MED ORDER — METHOCARBAMOL 750 MG PO TABS: 750.0000 mg | ORAL_TABLET | Freq: Four times a day (QID) | ORAL | 1 refills | Status: DC | PRN

## 2018-03-20 MED ORDER — HYDROCODONE-ACETAMINOPHEN 5-325 MG PO TABS: 1.0000 | ORAL_TABLET | ORAL | 0 refills | Status: DC | PRN

## 2018-03-20 NOTE — Discharge Instructions (Signed)

## 2018-03-20 NOTE — Evaluation (Signed)
Occupational Therapy Evaluation and Discharge Patient Details Name: Stacy Herrera MRN: [id number] DOB: [birth year] Today's Date: 03/20/2018    History of Present Illness Patient is a 73 yo female s/p Lumbar Four-Five Maximum access posterior lumbar interbody fusion with Resection of Synovial Cyst    Clinical Impression   Pt is functioning modified independently in ADL. Educated in back precautions during ADL and IADL and activities to avoid. Pt has excellent support of her family at home. She and her daughter were able to verbalize understanding of all education. No further OT needs    Follow Up Recommendations  No OT follow up    Equipment Recommendations  None recommended by OT    Recommendations for Other Services       Precautions / Restrictions Precautions Precautions: Back Precaution Booklet Issued: Yes (comment) Precaution Comments: verbally reviewed with patient Required Braces or Orthoses: Spinal Brace Spinal Brace: Lumbar corset;Applied in sitting position Restrictions Weight Bearing Restrictions: No      Mobility Bed Mobility               General bed mobility comments: not observed  Transfers Overall transfer level: Modified independent                    Balance Overall balance assessment: Mild deficits observed, not formally tested                                         ADL either performed or assessed with clinical judgement   ADL Overall ADL's : Modified independent                                       General ADL Comments: Educated pt at length in back precautions related to ADL and IADL and compensatory strategies including use of long bath sponge, toilet tongs and reacher.      Vision Baseline Vision/History: Wears glasses Wears Glasses: At all times Patient Visual Report: No change from baseline       Perception     Praxis      Pertinent Vitals/Pain Pain Assessment: Faces Faces  Pain Scale: Hurts a little bit Pain Location: operative site Pain Descriptors / Indicators: Operative site guarding;Sore Pain Intervention(s): Monitored during session;Repositioned     Hand Dominance Right   Extremity/Trunk Assessment Upper Extremity Assessment Upper Extremity Assessment: Overall WFL for tasks assessed   Lower Extremity Assessment Lower Extremity Assessment: Defer to PT evaluation       Communication Communication Communication: No difficulties   Cognition Arousal/Alertness: Awake/alert Behavior During Therapy: WFL for tasks assessed/performed Overall Cognitive Status: Within Functional Limits for tasks assessed                                     General Comments       Exercises     Shoulder Instructions      Home Living Family/patient expects to be discharged to:: Private residence Living Arrangements: Children Available Help at Discharge: Family Type of Home: House Home Access: Stairs to enter [employer] of Steps: 3   Home Layout: Multi-level;Able to live on main level with bedroom/bathroom     Bathroom Shower/Tub: Teacher, early years/pre: Standard  Home Equipment: Cane - single point          Prior Functioning/Environment Level of Independence: Independent        Comments: likes to clean        OT Problem List:        OT Treatment/Interventions:      OT Goals(Current goals can be found in the care plan section) Acute Rehab OT Goals Patient Stated Goal: to go home  OT Frequency:     Barriers to D/C:            Co-evaluation              AM-PAC OT "6 Clicks" Daily Activity     Outcome Measure Help from another person eating meals?: None Help from another person taking care of personal grooming?: None Help from another person toileting, which includes using toliet, bedpan, or urinal?: None Help from another person bathing (including washing, rinsing, drying)?: None Help  from another person to put on and taking off regular upper body clothing?: None Help from another person to put on and taking off regular lower body clothing?: None 6 Click Score: 24   End of Session    Activity Tolerance: Patient tolerated treatment well Patient left: in bed;with call bell/phone within reach;with family/visitor present(EOB)  OT Visit Diagnosis: Pain                Time: 5825-1898 OT Time Calculation (min): 15 min Charges:  OT General Charges $OT Visit: 1 Visit OT Evaluation $OT Eval Low Complexity: 1 Low  Stacy Herrera, OTR/L Acute Rehabilitation Services Pager: [phone] Office: [phone]  Stacy Herrera 03/20/2018, 8:59 AM

## 2018-03-20 NOTE — Progress Notes (Signed)
Physical Therapy Treatment Patient Details Name: Stacy Herrera MRN: [id number] DOB: 03/05/1945 Today's Date: 03/20/2018    History of Present Illness Patient is a 73 yo female s/p Lumbar Four-Five Maximum access posterior lumbar interbody fusion with Resection of Synovial Cyst     PT Comments    Pt progressing towards physical therapy goals. Pt required increased education regarding precautions and maintenance of precautions during functional mobility. Recommend use of SPC for balance support and safety. Pt and daughter were educated on stair negotiation, brace application/wearing schedule, activity progression and car transfer. Will continue to follow.    Follow Up Recommendations  No PT follow up;Supervision/Assistance - 24 hour     Equipment Recommendations  3in1 (PT)    Recommendations for Other Services       Precautions / Restrictions Precautions Precautions: Back Precaution Booklet Issued: Yes (comment) Precaution Comments: verbally reviewed with patient Required Braces or Orthoses: Spinal Brace Spinal Brace: Lumbar corset;Applied in sitting position Restrictions Weight Bearing Restrictions: No    Mobility  Bed Mobility               General bed mobility comments: Pt received standing in room without brace donned.   Transfers Overall transfer level: Modified independent Equipment used: Straight cane Transfers: Sit to/from Stand           General transfer comment: VC's for hand placement on seated surface for safety. No assist required.   Ambulation/Gait Ambulation/Gait assistance: Supervision Gait Distance (Feet): 300 Feet Assistive device: Straight cane Gait Pattern/deviations: Step-through pattern;Decreased stride length;Drifts right/left Gait velocity: decreased Gait velocity interpretation: 1.31 - 2.62 ft/sec, indicative of limited community ambulator General Gait Details: With SPC pt was able to ambulate fairly well in hall without overt  LOB. Pt with light support on cane and at times holding it up to "test her balance". Pt was educated on safe can usage and sequencing.    Stairs Stairs: Yes Stairs assistance: Min guard Stair Management: One rail Right;Step to pattern;Forwards Number of Stairs: 10 General stair comments: VC'a for sequencing and general safety. No assist required but close guard provided for safety.    Wheelchair Mobility    Modified Rankin (Stroke Patients Only)       Balance Overall balance assessment: Mild deficits observed, not formally tested                                          Cognition Arousal/Alertness: Awake/alert Behavior During Therapy: WFL for tasks assessed/performed Overall Cognitive Status: Within Functional Limits for tasks assessed                                        Exercises      General Comments        Pertinent Vitals/Pain Pain Assessment: Faces Faces Pain Scale: Hurts a little bit Pain Location: operative site Pain Descriptors / Indicators: Operative site guarding;Sore Pain Intervention(s): Monitored during session    Home Living Family/patient expects to be discharged to:: Private residence Living Arrangements: Children Available Help at Discharge: Family Type of Home: House Home Access: Stairs to enter   Home Layout: Multi-level;Able to live on main level with bedroom/bathroom Home Equipment: Kasandra Knudsen - single point      Prior Function Level of Independence: Independent      Comments: likes  to clean   PT Goals (current goals can now be found in the care plan section) Acute Rehab PT Goals Patient Stated Goal: to go home PT Goal Formulation: With patient/family Time For Goal Achievement: 04/02/18 Potential to Achieve Goals: Good Progress towards PT goals: Progressing toward goals    Frequency    Min 5X/week      PT Plan Current plan remains appropriate    Co-evaluation              AM-PAC PT  "6 Clicks" Mobility   Outcome Measure  Help needed turning from your back to your side while in a flat bed without using bedrails?: A Little Help needed moving from lying on your back to sitting on the side of a flat bed without using bedrails?: A Little Help needed moving to and from a bed to a chair (including a wheelchair)?: A Little Help needed standing up from a chair using your arms (e.g., wheelchair or bedside chair)?: A Little Help needed to walk in hospital room?: A Little Help needed climbing 3-5 steps with a railing? : A Little 6 Click Score: 18    End of Session Equipment Utilized During Treatment: Back brace Activity Tolerance: Patient tolerated treatment well;Patient limited by pain Patient left: in bed;with call bell/phone within reach;with family/visitor present;with SCD's reapplied Nurse Communication: Mobility status PT Visit Diagnosis: Unsteadiness on feet (R26.81);Difficulty in walking, not elsewhere classified (R26.2)     Time: 6770-3403 PT Time Calculation (min) (ACUTE ONLY): 14 min  Charges:  $Gait Training: 8-22 mins                     Rolinda Roan, PT, DPT Acute Rehabilitation Services Pager: [phone] Office: [phone]    Thelma Comp 03/20/2018, 9:15 AM

## 2018-03-20 NOTE — Discharge Summary (Signed)
Physician Discharge Summary  Patient ID: Stacy Herrera MRN: [id number] DOB/AGE: May 29, 1945 73 y.o.  Admit date: 03/19/2018 Discharge date: 03/20/2018  Admission Diagnoses:Synovial cyst and spondylolisthesis L 45 with stenosis, radiculopathy and lumbago  Discharge Diagnoses: Synovial cyst and spondylolisthesis L 45 with stenosis, radiculopathy and lumbago Active Problems:   Spondylolisthesis of lumbar region   Discharged Condition: good  Hospital Course: Patient underwent decompression and fusion with resection of synovial cyst L 45 level.  She did well with surgery and was discharged home on the the am of POD 1.    Consults: None  Significant Diagnostic Studies: None  Treatments: surgery: Patient underwent decompression and fusion with resection of synovial cyst L 45 level  Discharge Exam: Blood pressure (!) 105/46, pulse 72, temperature 98 F (36.7 C), temperature source Oral, resp. rate 16, height 5\' 6"  (1.676 m), weight 96.6 kg, SpO2 96 %. Neurologic: Alert and oriented X 3, normal strength and tone. Normal symmetric reflexes. Normal coordination and gait Wound:CDI  Disposition: Home  Discharge Instructions    Diet - low sodium heart healthy   Complete by:  As directed    Increase activity slowly   Complete by:  As directed      Allergies as of 03/20/2018      Reactions   Penicillins Itching, Rash, Other (See Comments)   PATIENT HAS HAD A PCN REACTION WITH IMMEDIATE RASH, FACIAL/TONGUE/THROAT SWELLING, SOB, OR LIGHTHEADEDNESS WITH HYPOTENSION:  #  #  YES  #  #  Has patient had a PCN reaction causing severe rash involving mucus membranes or skin necrosis: Unknown Has patient had a PCN reaction that required hospitalization: Unknown Has patient had a PCN reaction occurring within the last 10 years: Unknown If all of the above answers are "NO", then may proceed with Cephalosporin use.   Benadryl [diphenhydramine Hcl] Other (See Comments)   UNKNOWN       Medication  List    TAKE these medications   acetaminophen 500 MG tablet Commonly known as:  TYLENOL Take 1,000 mg by mouth every 6 (six) hours as needed for moderate pain or headache.   CALCIUM + VITAMIN D3 PO Take 2 tablets by mouth daily.   glucosamine-chondroitin 500-400 MG tablet Take 2 tablets by mouth daily.   HYDROcodone-acetaminophen 5-325 MG tablet Commonly known as:  NORCO/VICODIN Take 1-2 tablets by mouth every 4 (four) hours as needed for moderate pain or severe pain ((score 7 to 10)).   levothyroxine 50 MCG tablet Commonly known as:  SYNTHROID, LEVOTHROID Take 50 mcg by mouth daily before breakfast.   methocarbamol 750 MG tablet Commonly known as:  ROBAXIN Take 1 tablet (750 mg total) by mouth every 6 (six) hours as needed for muscle spasms.   MULTIVITAMIN/IRON PO Take 1 tablet by mouth daily.   simvastatin 20 MG tablet Commonly known as:  ZOCOR TAKE 1 TABLET BY MOUTH EVERY EVENING FOR CHOLESTEROL What changed:  additional instructions   VITAMIN D3 PO Take 2 capsules by mouth daily.        Signed: Peggyann Shoals, MD 03/20/2018, 7:50 AM

## 2018-03-20 NOTE — Progress Notes (Signed)
Subjective: Patient reports doing well  Objective: Vital signs in last 24 hours: Temp:  [97.3 F (36.3 C)-98.2 F (36.8 C)] 98 F (36.7 C) (01/22 0725) Pulse Rate:  [64-81] 72 (01/22 0725) Resp:  [10-20] 16 (01/22 0725) BP: (99-130)/(46-66) 105/46 (01/22 0725) SpO2:  [93 %-100 %] 96 % (01/22 0725)  Intake/Output from previous day: 01/21 0701 - 01/22 0700 In: 1000 [I.V.:1000] Out: 1050 [Urine:850; Blood:200] Intake/Output this shift: No intake/output data recorded.  Physical Exam: Full strength both legs.  No numbness.  Dressing CDI.  Lab Results: No results for input(s): WBC, HGB, HCT, PLT in the last 72 hours. BMET No results for input(s): NA, K, CL, CO2, GLUCOSE, BUN, CREATININE, CALCIUM in the last 72 hours.  Studies/Results: Dg Lumbar Spine 2-3 Views  Result Date: 03/19/2018 CLINICAL DATA:  L4-L5 PLIF. EXAM: LUMBAR SPINE - 2-3 VIEW; DG C-ARM 61-120 MIN COMPARISON:  MRI lumbar spine dated March 04, 2018. FLUOROSCOPY TIME:  1 minutes, 16 seconds. C-arm fluoroscopic images were obtained intraoperatively and submitted for post operative interpretation. FINDINGS: Multiple intraoperative fluoroscopic images demonstrate interval posterior decompression at L4-L5 with placement of pedicle screws and an interbody graft. Mild anterolisthesis at L4-L5 appears slightly improved. IMPRESSION: Intraoperative fluoroscopic guidance for L4-L5 PLIF. Electronically Signed   By: Titus Dubin M.D.   On: 03/19/2018 10:17   Dg C-arm 1-60 Min  Result Date: 03/19/2018 CLINICAL DATA:  L4-L5 PLIF. EXAM: LUMBAR SPINE - 2-3 VIEW; DG C-ARM 61-120 MIN COMPARISON:  MRI lumbar spine dated March 04, 2018. FLUOROSCOPY TIME:  1 minutes, 16 seconds. C-arm fluoroscopic images were obtained intraoperatively and submitted for post operative interpretation. FINDINGS: Multiple intraoperative fluoroscopic images demonstrate interval posterior decompression at L4-L5 with placement of pedicle screws and an interbody  graft. Mild anterolisthesis at L4-L5 appears slightly improved. IMPRESSION: Intraoperative fluoroscopic guidance for L4-L5 PLIF. Electronically Signed   By: Titus Dubin M.D.   On: 03/19/2018 10:17    Assessment/Plan: Patient is doing well.  Discharge home.    LOS: 1 day    Peggyann Shoals, MD 03/20/2018, 7:49 AM

## 2018-03-20 NOTE — Progress Notes (Signed)
Patient is discharged from room 3C04 at this time. Alert and in stable condition. IV site d/c'd and instructions read to patient and daughter with understanding verbalized. Left unit via wheelchair with all belongings at side 

## 2018-03-21 MED FILL — Sodium Chloride IV Soln 0.9%: INTRAVENOUS | Qty: 1000 | Status: AC

## 2018-03-21 MED FILL — Heparin Sodium (Porcine) Inj 1000 Unit/ML: INTRAMUSCULAR | Qty: 30 | Status: AC

## 2018-04-15 DIAGNOSIS — M48061 Spinal stenosis, lumbar region without neurogenic claudication: Secondary | ICD-10-CM | POA: Diagnosis not present

## 2018-04-15 DIAGNOSIS — M48062 Spinal stenosis, lumbar region with neurogenic claudication: Secondary | ICD-10-CM | POA: Diagnosis not present

## 2018-04-15 DIAGNOSIS — M4317 Spondylolisthesis, lumbosacral region: Secondary | ICD-10-CM | POA: Diagnosis not present

## 2018-04-15 DIAGNOSIS — M5416 Radiculopathy, lumbar region: Secondary | ICD-10-CM | POA: Diagnosis not present

## 2018-04-18 IMAGING — DX DG KNEE COMPLETE 4+V*L*
4 series · 4 of 4 positions shown · non-contrast
Comparison: None.

CLINICAL DATA: Knee pain for several days, no known injury, initial
encounter

EXAM:
LEFT KNEE - COMPLETE 4+ VIEW

[knee ap (1 of 3)]
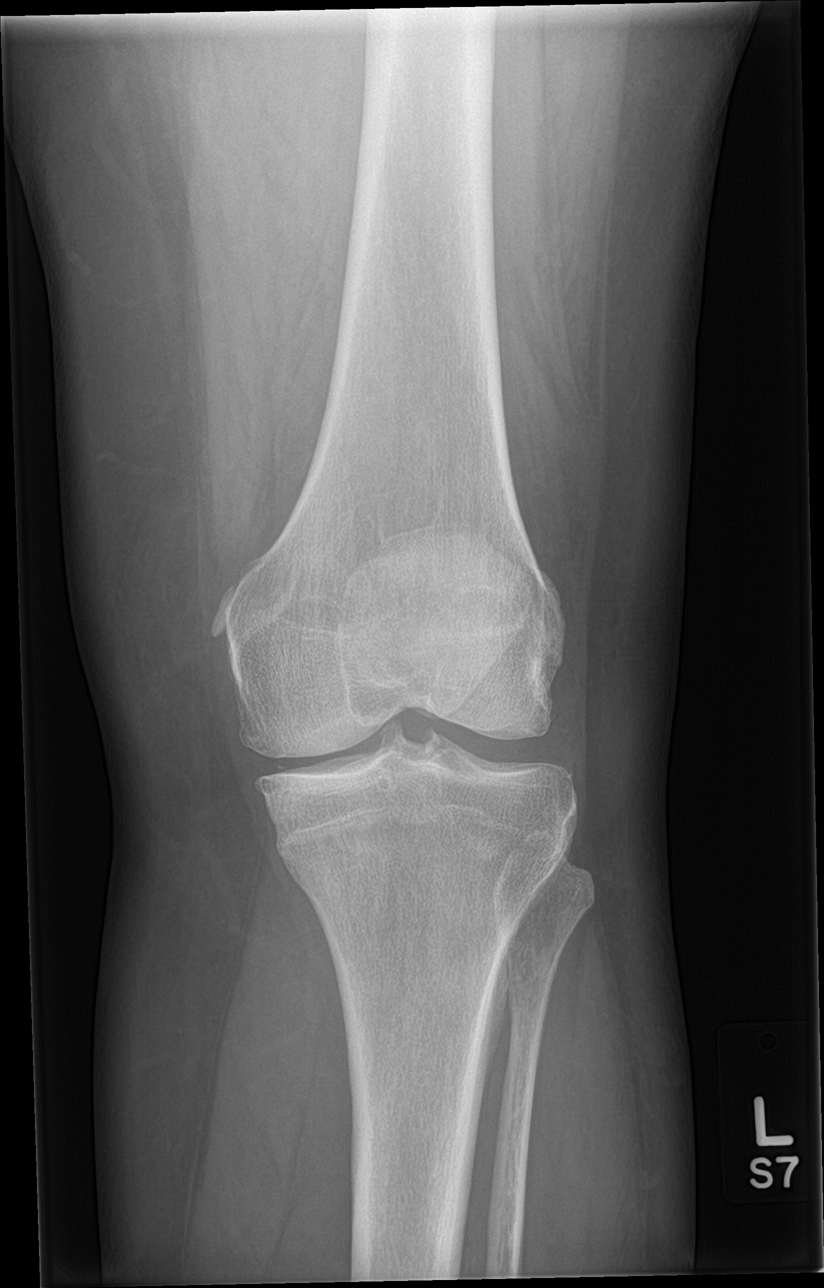

[knee lat]
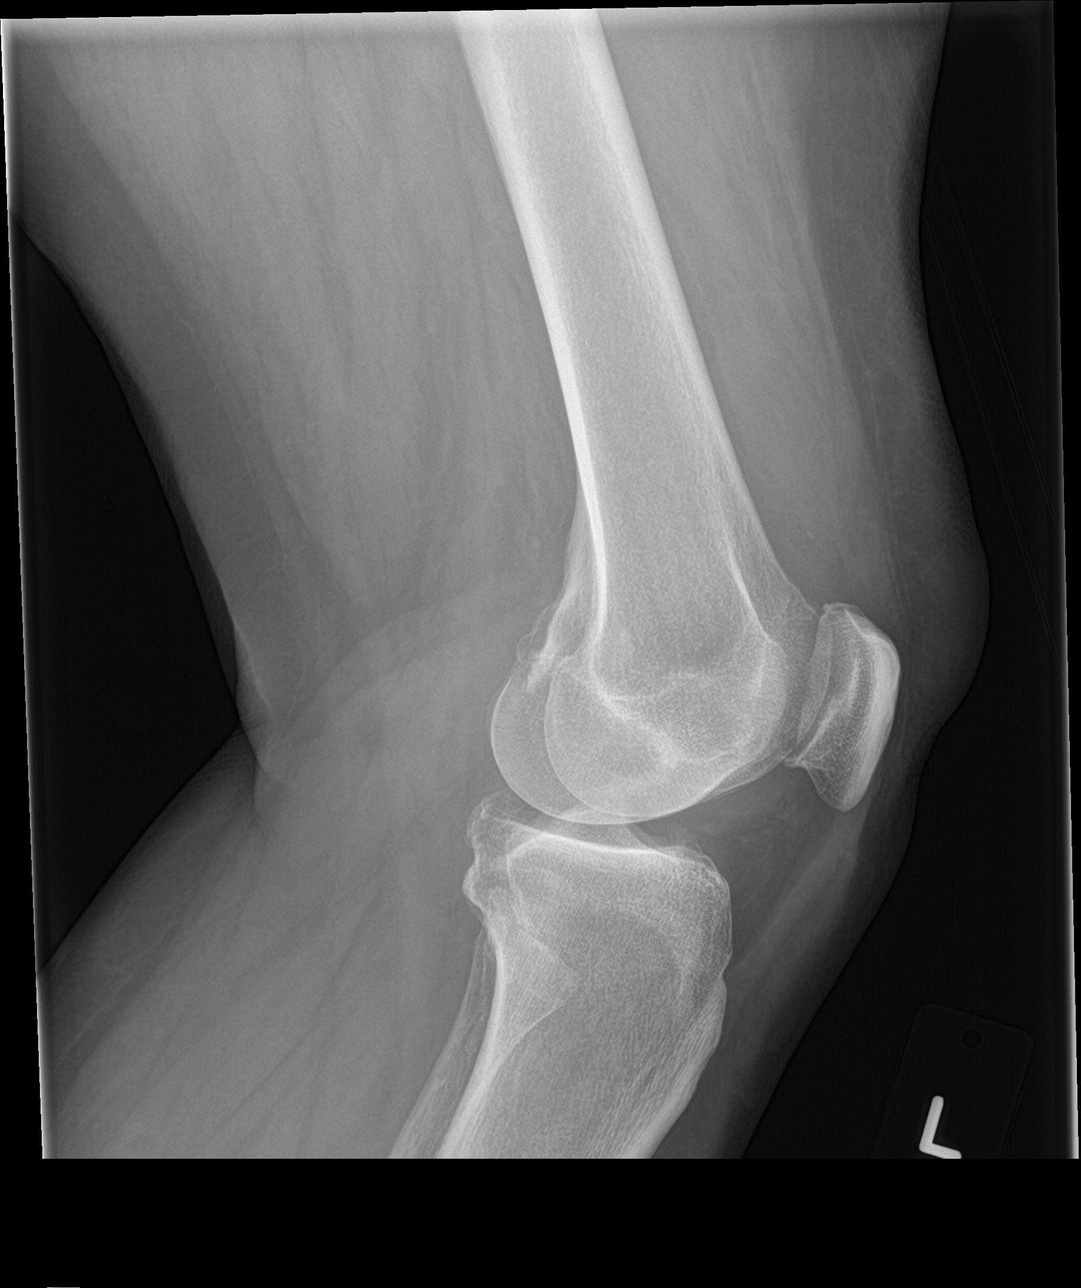

[knee ap (2 of 3)]
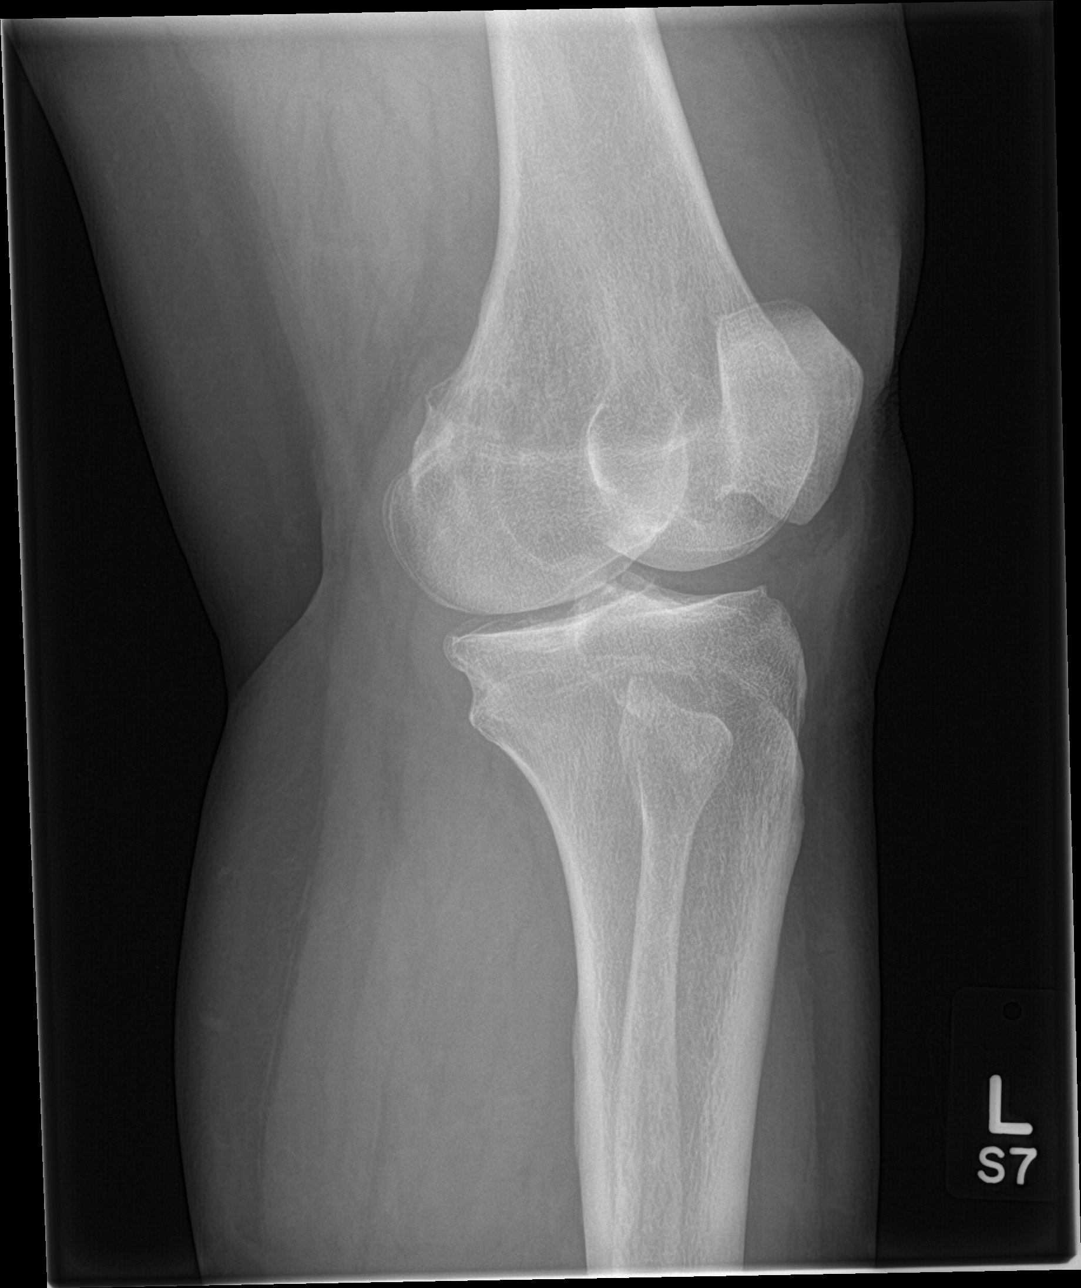

[knee ap (3 of 3)]
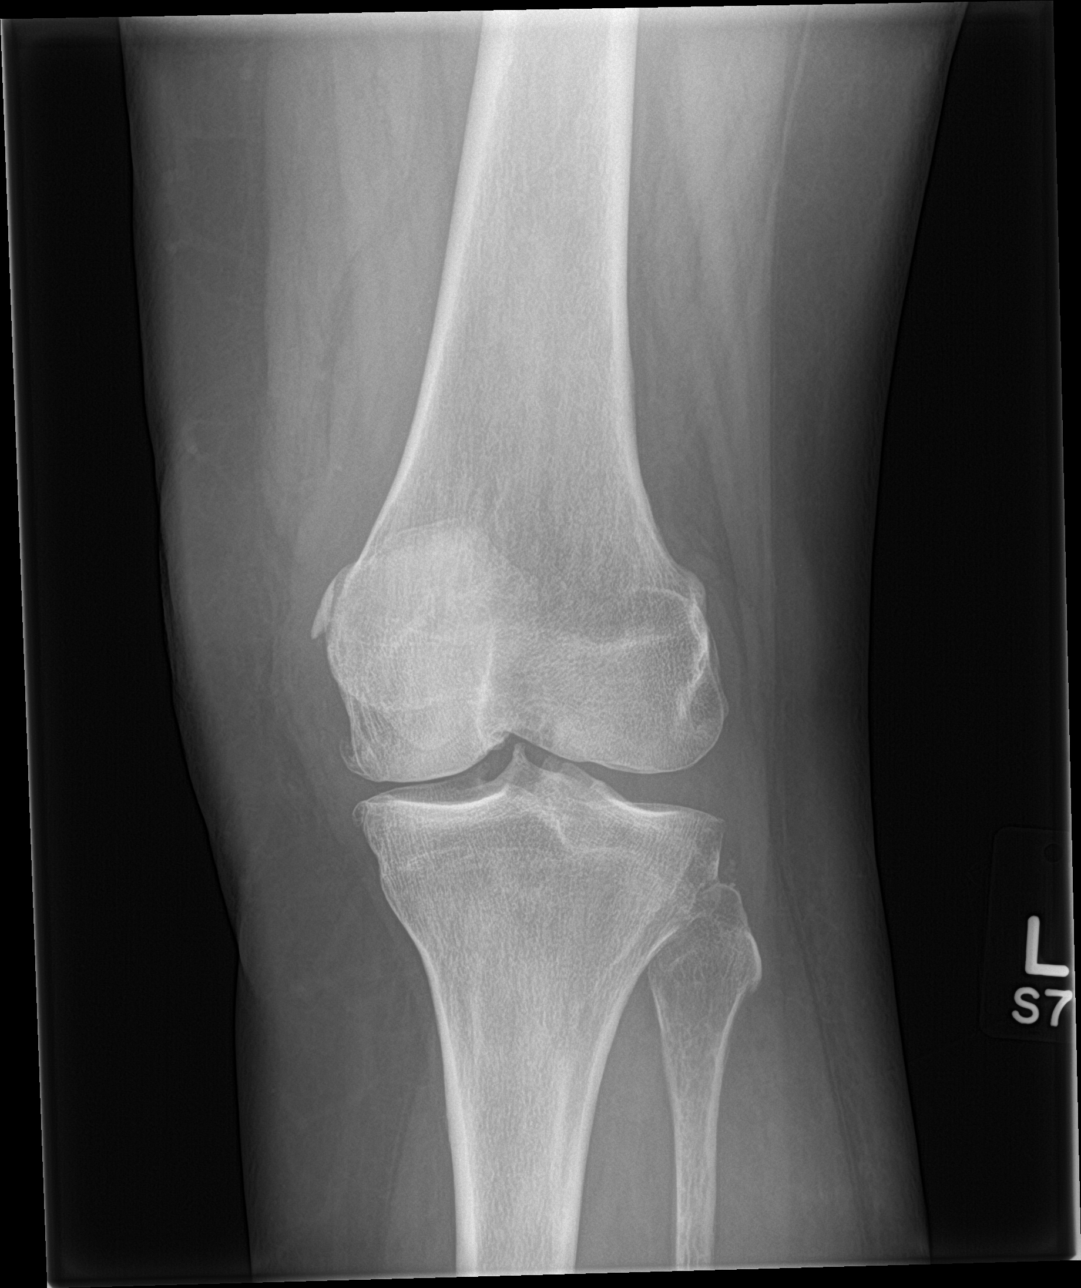

[4 of 4 positions shown; findings below may reference images not displayed]

FINDINGS: Joint space narrowing is noted medially. This would correspond with
the patient's given clinical history of prior meniscal surgery. Mild
osteophytic changes are seen. No joint effusion is noted. No soft
tissue abnormality is seen. No acute fracture is noted.
IMPRESSION: Degenerative changes without acute abnormality.

## 2018-04-20 ENCOUNTER — Other Ambulatory Visit: Payer: Self-pay | Admitting: Obstetrics & Gynecology

## 2018-05-07 DIAGNOSIS — E7849 Other hyperlipidemia: Secondary | ICD-10-CM | POA: Diagnosis not present

## 2018-05-07 DIAGNOSIS — E039 Hypothyroidism, unspecified: Secondary | ICD-10-CM | POA: Diagnosis not present

## 2018-05-07 DIAGNOSIS — Z1389 Encounter for screening for other disorder: Secondary | ICD-10-CM | POA: Diagnosis not present

## 2018-05-07 DIAGNOSIS — Z6836 Body mass index (BMI) 36.0-36.9, adult: Secondary | ICD-10-CM | POA: Diagnosis not present

## 2018-05-07 DIAGNOSIS — Z0001 Encounter for general adult medical examination with abnormal findings: Secondary | ICD-10-CM | POA: Diagnosis not present

## 2018-05-07 DIAGNOSIS — I10 Essential (primary) hypertension: Secondary | ICD-10-CM | POA: Diagnosis not present

## 2018-05-07 DIAGNOSIS — M4690 Unspecified inflammatory spondylopathy, site unspecified: Secondary | ICD-10-CM | POA: Diagnosis not present

## 2018-05-07 DIAGNOSIS — Z23 Encounter for immunization: Secondary | ICD-10-CM | POA: Diagnosis not present

## 2018-05-07 DIAGNOSIS — R7309 Other abnormal glucose: Secondary | ICD-10-CM | POA: Diagnosis not present

## 2018-05-14 ENCOUNTER — Other Ambulatory Visit (HOSPITAL_COMMUNITY): Payer: Self-pay | Admitting: Family Medicine

## 2018-05-14 DIAGNOSIS — Z1231 Encounter for screening mammogram for malignant neoplasm of breast: Secondary | ICD-10-CM

## 2018-06-13 ENCOUNTER — Ambulatory Visit (HOSPITAL_COMMUNITY): Payer: Medicare HMO

## 2018-09-12 DIAGNOSIS — L570 Actinic keratosis: Secondary | ICD-10-CM | POA: Diagnosis not present

## 2018-09-12 DIAGNOSIS — Z85820 Personal history of malignant melanoma of skin: Secondary | ICD-10-CM | POA: Diagnosis not present

## 2018-09-12 DIAGNOSIS — D225 Melanocytic nevi of trunk: Secondary | ICD-10-CM | POA: Diagnosis not present

## 2018-09-12 DIAGNOSIS — Z08 Encounter for follow-up examination after completed treatment for malignant neoplasm: Secondary | ICD-10-CM | POA: Diagnosis not present

## 2018-09-12 DIAGNOSIS — Z1283 Encounter for screening for malignant neoplasm of skin: Secondary | ICD-10-CM | POA: Diagnosis not present

## 2018-09-12 DIAGNOSIS — X32XXXD Exposure to sunlight, subsequent encounter: Secondary | ICD-10-CM | POA: Diagnosis not present

## 2018-10-25 ENCOUNTER — Telehealth: Payer: Self-pay | Admitting: Obstetrics & Gynecology

## 2018-10-25 MED ORDER — TERCONAZOLE 0.4 % VA CREA: 1.0000 | TOPICAL_CREAM | Freq: Every day | VAGINAL | 0 refills | Status: DC

## 2018-10-25 NOTE — Telephone Encounter (Signed)
terazol prescribed

## 2018-10-25 NOTE — Telephone Encounter (Signed)
Pt informed to go to [facility] for rx.

## 2018-10-25 NOTE — Telephone Encounter (Signed)
Pt states that she is having vaginal itching and would like to discuss with a nurse and to see if medication can be sent in for her.

## 2018-10-28 DIAGNOSIS — E782 Mixed hyperlipidemia: Secondary | ICD-10-CM | POA: Diagnosis not present

## 2018-10-28 DIAGNOSIS — I10 Essential (primary) hypertension: Secondary | ICD-10-CM | POA: Diagnosis not present

## 2018-10-28 DIAGNOSIS — E039 Hypothyroidism, unspecified: Secondary | ICD-10-CM | POA: Diagnosis not present

## 2018-12-28 DIAGNOSIS — E559 Vitamin D deficiency, unspecified: Secondary | ICD-10-CM | POA: Diagnosis not present

## 2018-12-28 DIAGNOSIS — E039 Hypothyroidism, unspecified: Secondary | ICD-10-CM | POA: Diagnosis not present

## 2018-12-28 DIAGNOSIS — E782 Mixed hyperlipidemia: Secondary | ICD-10-CM | POA: Diagnosis not present

## 2019-02-27 DIAGNOSIS — E7849 Other hyperlipidemia: Secondary | ICD-10-CM | POA: Diagnosis not present

## 2019-02-27 DIAGNOSIS — E039 Hypothyroidism, unspecified: Secondary | ICD-10-CM | POA: Diagnosis not present

## 2019-03-05 ENCOUNTER — Telehealth: Payer: Self-pay | Admitting: *Deleted

## 2019-03-05 NOTE — Telephone Encounter (Signed)
Patient left message changed insurance companies and needs refill on simvastatin. Please give a call back.

## 2019-03-06 ENCOUNTER — Telehealth: Payer: Self-pay | Admitting: Obstetrics & Gynecology

## 2019-03-06 MED ORDER — SIMVASTATIN 20 MG PO TABS: 20.0000 mg | ORAL_TABLET | Freq: Every evening | ORAL | 3 refills | Status: AC

## 2019-03-06 NOTE — Telephone Encounter (Signed)
Pt needs refill on her Simvastatin, she has a new mailorder for [place] to send it to.

## 2019-03-06 NOTE — Telephone Encounter (Addendum)
Pt would like a 3 month supply of Simvastatin sent to [facility] on [address]. Thanks!! [place]

## 2019-03-06 NOTE — Telephone Encounter (Signed)
Left message @ 10:22 am. JSY

## 2019-03-13 DIAGNOSIS — L570 Actinic keratosis: Secondary | ICD-10-CM | POA: Diagnosis not present

## 2019-03-13 DIAGNOSIS — Z1283 Encounter for screening for malignant neoplasm of skin: Secondary | ICD-10-CM | POA: Diagnosis not present

## 2019-03-13 DIAGNOSIS — Z85820 Personal history of malignant melanoma of skin: Secondary | ICD-10-CM | POA: Diagnosis not present

## 2019-03-13 DIAGNOSIS — D225 Melanocytic nevi of trunk: Secondary | ICD-10-CM | POA: Diagnosis not present

## 2019-03-13 DIAGNOSIS — Z08 Encounter for follow-up examination after completed treatment for malignant neoplasm: Secondary | ICD-10-CM | POA: Diagnosis not present

## 2019-03-14 ENCOUNTER — Telehealth: Payer: Self-pay | Admitting: *Deleted

## 2019-03-14 ENCOUNTER — Telehealth: Payer: Self-pay | Admitting: Obstetrics & Gynecology

## 2019-03-14 MED ORDER — TERCONAZOLE 0.4 % VA CREA: 1.0000 | TOPICAL_CREAM | Freq: Every day | VAGINAL | 1 refills | Status: DC

## 2019-03-14 NOTE — Telephone Encounter (Signed)
Patient states she is having vaginal itching and discomfort. She is requesting terconazole cream be sent to her pharmacy as she used this back in August for the same issue and it helped.

## 2019-03-24 DIAGNOSIS — J019 Acute sinusitis, unspecified: Secondary | ICD-10-CM | POA: Diagnosis not present

## 2019-03-24 DIAGNOSIS — J301 Allergic rhinitis due to pollen: Secondary | ICD-10-CM | POA: Diagnosis not present

## 2019-03-27 ENCOUNTER — Telehealth: Payer: Self-pay | Admitting: *Deleted

## 2019-03-27 NOTE — Telephone Encounter (Signed)
Pt informed that waitlist status can not be accessed. Pt does not have email so did not get confirmation. States she called in and we set her up. Assured pt that she will get a call as appts open up. Verbalized understanding.

## 2019-03-28 ENCOUNTER — Telehealth: Payer: Self-pay | Admitting: *Deleted

## 2019-03-28 NOTE — Telephone Encounter (Signed)
Patient states she recently finished terconazole cream for vaginal itching.  She has been treated with antibiotics for a sinus infection and is now having vaginal itching.  She is requesting Diflucan as she thinks she has a yeast infection. Advised Dr Elonda Husky was out of the office this week and I would

## 2019-03-30 DIAGNOSIS — E039 Hypothyroidism, unspecified: Secondary | ICD-10-CM | POA: Diagnosis not present

## 2019-03-30 DIAGNOSIS — E7849 Other hyperlipidemia: Secondary | ICD-10-CM | POA: Diagnosis not present

## 2019-04-13 ENCOUNTER — Other Ambulatory Visit: Payer: Self-pay

## 2019-04-13 ENCOUNTER — Ambulatory Visit: Payer: Medicare Other | Attending: Internal Medicine

## 2019-04-13 DIAGNOSIS — Z23 Encounter for immunization: Secondary | ICD-10-CM | POA: Insufficient documentation

## 2019-04-13 NOTE — Progress Notes (Signed)
   Covid-19 Vaccination Clinic  Name:  Rexann Marchesani    MRN: [id number] DOB: 05-31-1945  04/13/2019  Ms. Binkowski was observed post Covid-19 immunization for 15 minutes without incidence. She was provided with Vaccine Information Sheet and instruction to access the V-Safe system.   Ms. Dittmar was instructed to call 911 with any severe reactions post vaccine: Marland Kitchen Difficulty breathing  . Swelling of your face and throat  . A fast heartbeat  . A bad rash all over your body  . Dizziness and weakness    Immunizations Administered    Name Date Dose VIS Date Route   Moderna COVID-19 Vaccine 04/13/2019  3:04 PM 0.5 mL 01/28/2019 Intramuscular   Manufacturer: Moderna   Lot: [id number]   [id number]

## 2019-04-27 DIAGNOSIS — E039 Hypothyroidism, unspecified: Secondary | ICD-10-CM | POA: Diagnosis not present

## 2019-04-27 DIAGNOSIS — E7849 Other hyperlipidemia: Secondary | ICD-10-CM | POA: Diagnosis not present

## 2019-05-11 ENCOUNTER — Ambulatory Visit: Payer: Medicare Other | Attending: Internal Medicine

## 2019-05-11 DIAGNOSIS — Z23 Encounter for immunization: Secondary | ICD-10-CM

## 2019-05-11 NOTE — Progress Notes (Signed)
   Covid-19 Vaccination Clinic  Name:  Stacy Herrera    MRN: [id number] DOB: February 02, 1946  05/11/2019  [name] was observed post Covid-19 immunization for 15 minutes without incident. She was provided with Vaccine Information Sheet and instruction to access the V-Safe system.   [name] was instructed to call 911 with any severe reactions post vaccine: Marland Kitchen Difficulty breathing  . Swelling of face and throat  . A fast heartbeat  . A bad rash all over body  . Dizziness and weakness   Immunizations Administered    Name Date Dose VIS Date Route   Moderna COVID-19 Vaccine 05/11/2019  1:11 PM 0.5 mL 01/28/2019 Intramuscular   Manufacturer: Moderna   Lot: [id number]   [id number]

## 2019-05-14 ENCOUNTER — Other Ambulatory Visit (HOSPITAL_COMMUNITY): Payer: Self-pay | Admitting: Family Medicine

## 2019-05-14 ENCOUNTER — Other Ambulatory Visit: Payer: Self-pay | Admitting: Family Medicine

## 2019-05-14 DIAGNOSIS — E782 Mixed hyperlipidemia: Secondary | ICD-10-CM | POA: Diagnosis not present

## 2019-05-14 DIAGNOSIS — I10 Essential (primary) hypertension: Secondary | ICD-10-CM | POA: Diagnosis not present

## 2019-05-14 DIAGNOSIS — J302 Other seasonal allergic rhinitis: Secondary | ICD-10-CM | POA: Diagnosis not present

## 2019-05-14 DIAGNOSIS — Z1389 Encounter for screening for other disorder: Secondary | ICD-10-CM | POA: Diagnosis not present

## 2019-05-14 DIAGNOSIS — R7309 Other abnormal glucose: Secondary | ICD-10-CM | POA: Diagnosis not present

## 2019-05-14 DIAGNOSIS — E559 Vitamin D deficiency, unspecified: Secondary | ICD-10-CM | POA: Diagnosis not present

## 2019-05-14 DIAGNOSIS — E039 Hypothyroidism, unspecified: Secondary | ICD-10-CM | POA: Diagnosis not present

## 2019-05-14 DIAGNOSIS — M4690 Unspecified inflammatory spondylopathy, site unspecified: Secondary | ICD-10-CM | POA: Diagnosis not present

## 2019-05-14 DIAGNOSIS — G459 Transient cerebral ischemic attack, unspecified: Secondary | ICD-10-CM

## 2019-05-14 DIAGNOSIS — E7849 Other hyperlipidemia: Secondary | ICD-10-CM | POA: Diagnosis not present

## 2019-05-14 DIAGNOSIS — Z0001 Encounter for general adult medical examination with abnormal findings: Secondary | ICD-10-CM | POA: Diagnosis not present

## 2019-05-21 IMAGING — MR MR LUMBAR SPINE W/O CM
4 of 5 series · 15 of 48 positions shown · non-contrast
Comparison: Lumbar spine MRI 03/15/2012

CLINICAL DATA: Low back and bilateral leg pain for 6 months.

EXAM:
MRI LUMBAR SPINE WITHOUT CONTRAST
TECHNIQUE: Multiplanar, multisequence MR imaging of the lumbar spine was
performed. No intravenous contrast was administered.

[Series 3: T2 · sagittal · 4.0mm · 0.69mm/px · 6 of 13 slices shown (1 of 2)]
[im 1/13]
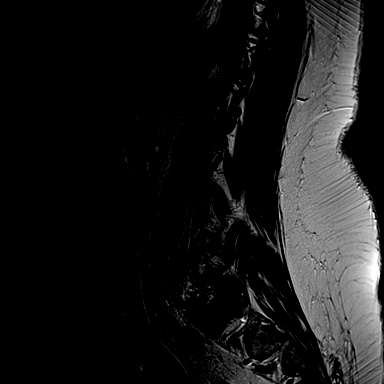
[im 3/13]
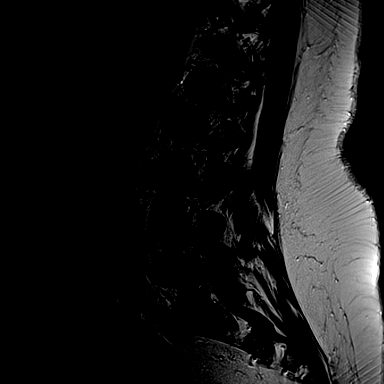
[im 5/13]
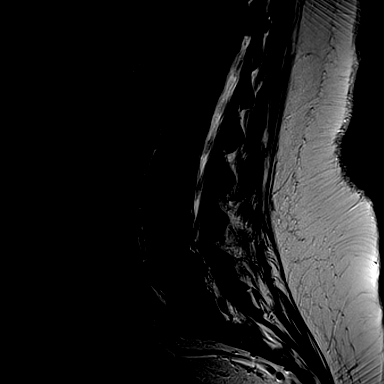
[im 8/13]
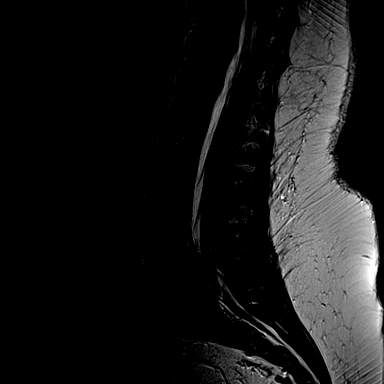
[im 10/13]
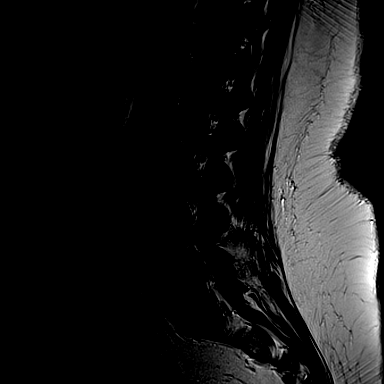
[im 13/13]
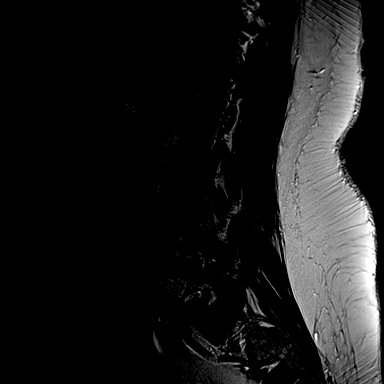

[Series 4: T1 · sagittal · 4.0mm · 0.34mm/px · 3 of 13 slices shown (1 of 2)]
[im 1/13]
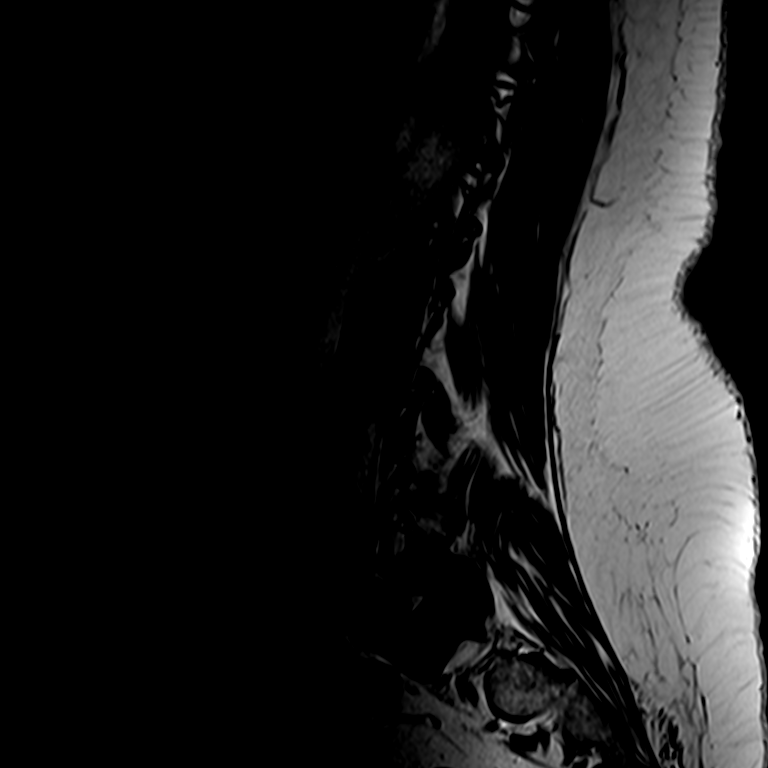
[im 7/13]
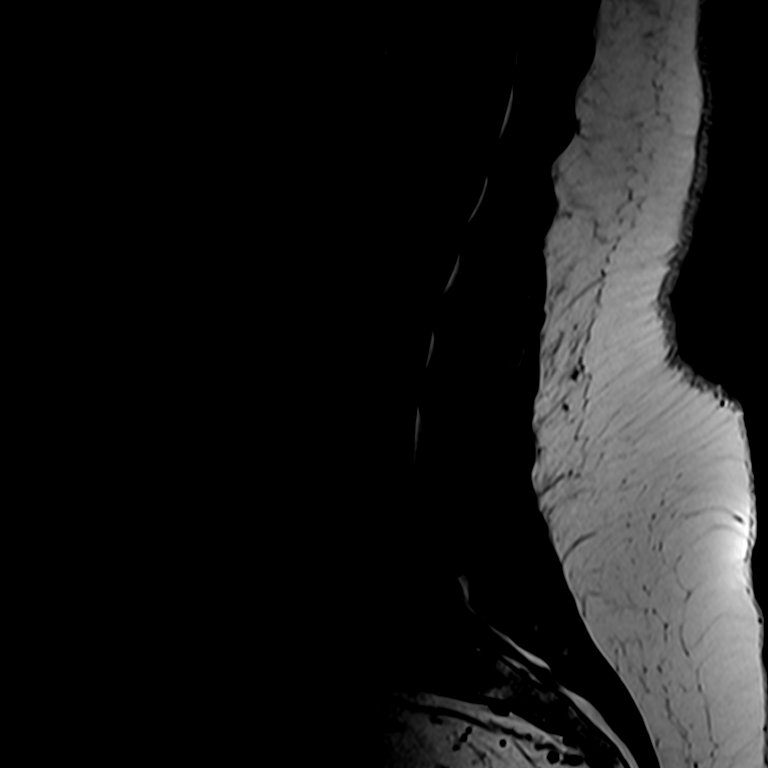
[im 13/13]
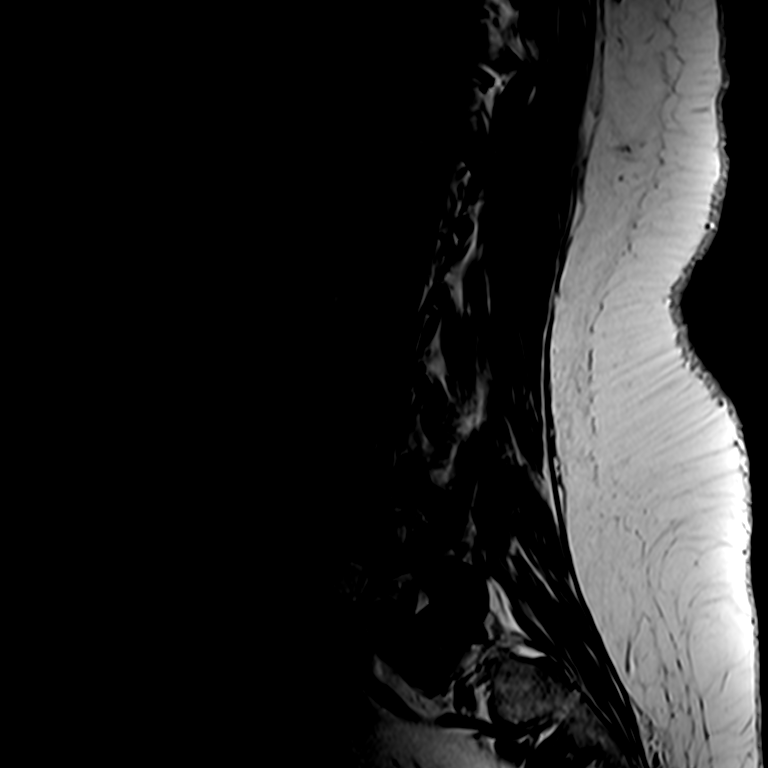

[Series 6: T2 · axial · 4.0mm · 0.23mm/px · z∈[-59,+100]mm · 3 of 40 slices shown (2 of 2)]
[im 6/40]
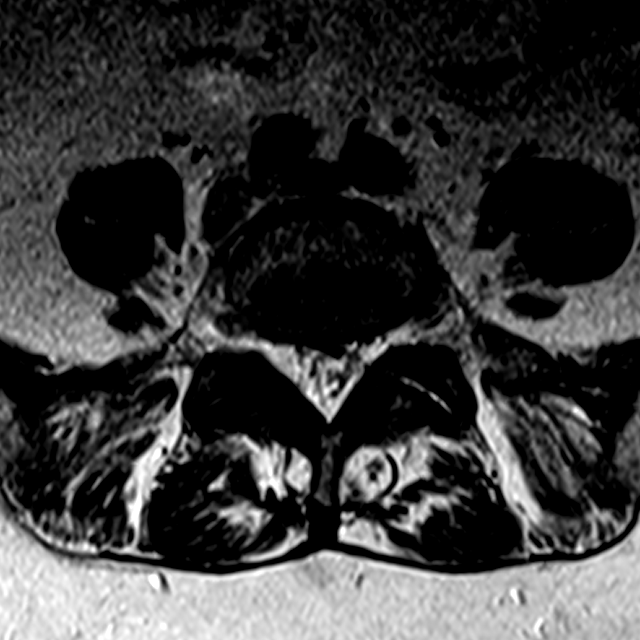
[im 21/40]
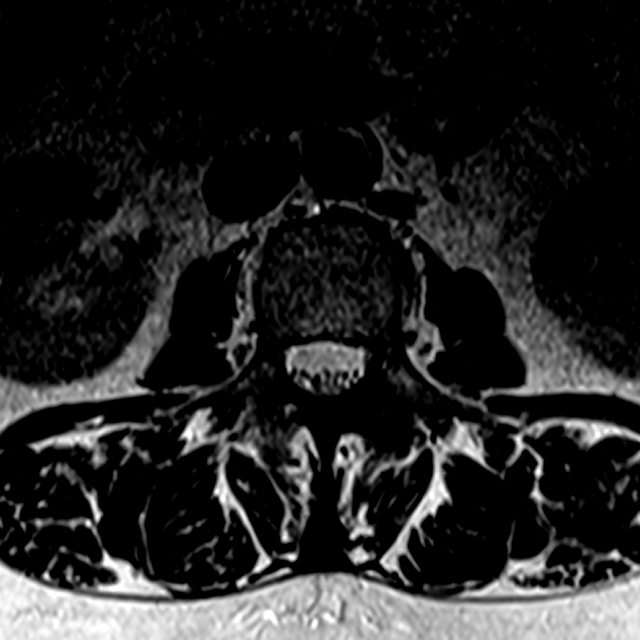
[im 34/40]
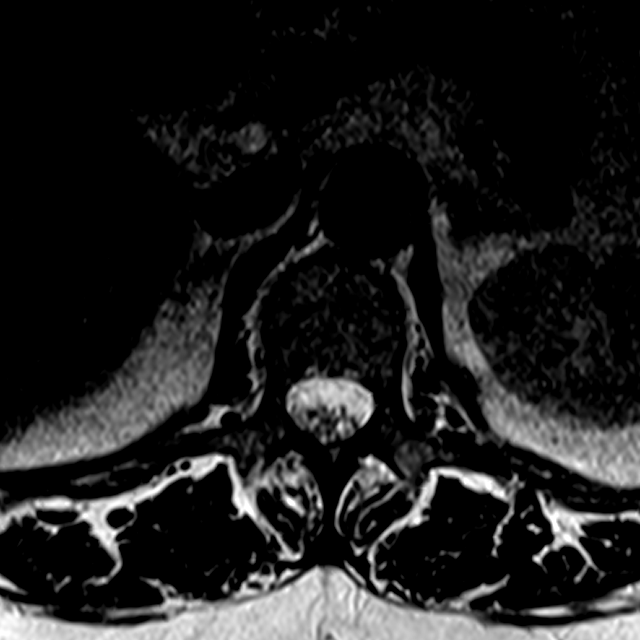

[Series 7: T1 · axial · 4.0mm · 0.23mm/px · z∈[-59,+100]mm · 3 of 40 slices shown (2 of 2)]
[im 6/40]
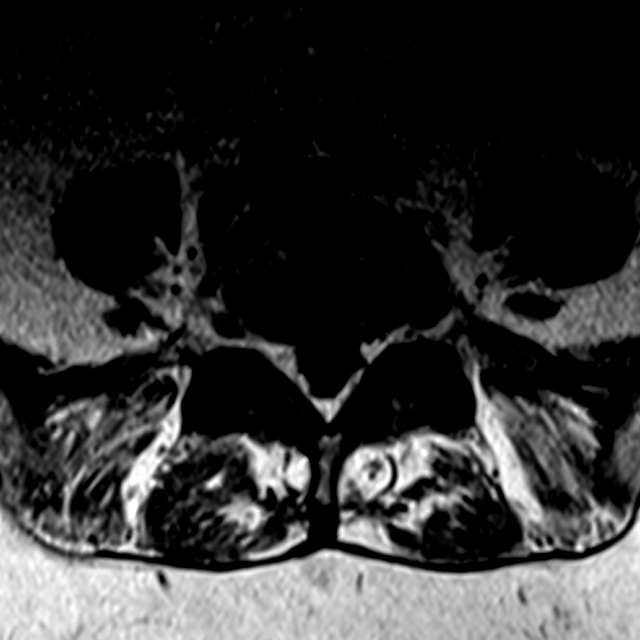
[im 21/40]
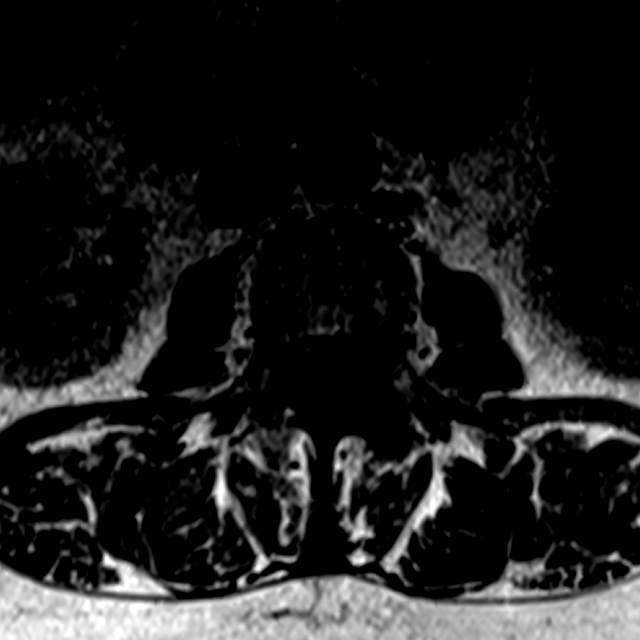
[im 34/40]
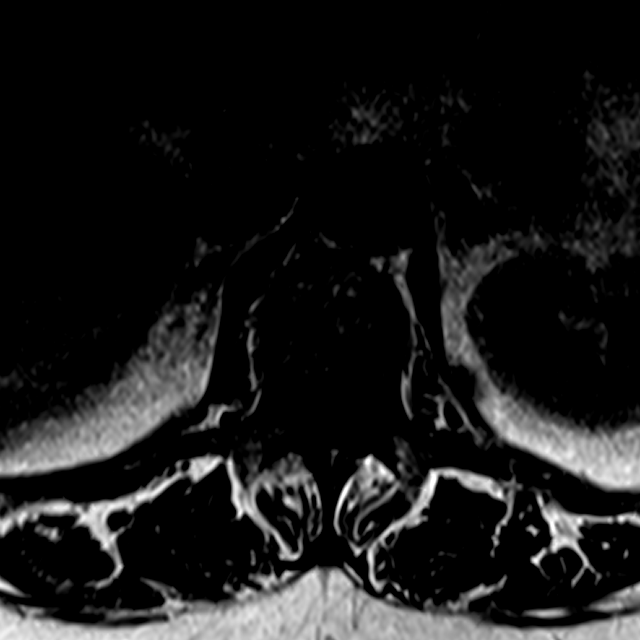

[15 of 48 positions shown; findings below may reference images not displayed]

FINDINGS: Segmentation: 5 lumbar type vertebral bodies. The last full
intervertebral disc space is labeled L5-S1. This is consistent with
the prior MRI.

Alignment: Stable degenerative/ non isthmic spondylolisthesis at L4.
The other lumbar vertebral bodies are normally aligned.

Vertebrae: Normal marrow signal except for a benign L2 hemangioma.
No fracture or bone lesion.

Conus medullaris and cauda equina: Conus extends to the L1 level.
Conus and cauda equina appear normal.

Paraspinal and other soft tissues: No significant findings

Disc levels:

L1-2:  No significant findings.

L2-3: Mild to moderate facet disease but no disc protrusions, spinal
or foraminal stenosis.

L3-4: Mild to moderate facet disease but no disc protrusions, spinal
or foraminal stenosis.

L4-5: Degenerative spondylolisthesis of L4 with a bulging uncovered
disc. This in combination with short pedicles and severe facet
disease contributes to moderate spinal and bilateral lateral recess
stenosis slightly progressive since the prior study. There is also a
progressive shallow broad-based left foraminal and extraforaminal
disc protrusion displacing the left L4 nerve root. Minimal right
foraminal stenosis also.

L5-S1: Facet disease but no disc protrusions, spinal or foraminal
stenosis.
IMPRESSION: Degenerative spondylolisthesis of L4 with a bulging uncovered disc,
shallow broad-based left foraminal and extraforaminal disc
protrusion, short pedicles, facet disease and ligamentum flavum
thickening and buckling all contributing to moderate spinal,
bilateral lateral recess stenosis and bilateral foraminal stenosis,
left greater than right. Findings are progressive since the prior
MRI.

## 2019-05-28 DIAGNOSIS — E7849 Other hyperlipidemia: Secondary | ICD-10-CM | POA: Diagnosis not present

## 2019-05-28 DIAGNOSIS — E039 Hypothyroidism, unspecified: Secondary | ICD-10-CM | POA: Diagnosis not present

## 2019-06-18 ENCOUNTER — Ambulatory Visit (HOSPITAL_COMMUNITY)
Admission: RE | Admit: 2019-06-18 | Discharge: 2019-06-18 | Disposition: A | Discharge status: Home / Self Care | Payer: Medicare Other | Source: Ambulatory Visit | Attending: Family Medicine | Admitting: Family Medicine

## 2019-06-18 ENCOUNTER — Other Ambulatory Visit: Payer: Self-pay

## 2019-06-18 DIAGNOSIS — R413 Other amnesia: Secondary | ICD-10-CM | POA: Diagnosis not present

## 2019-06-18 DIAGNOSIS — G459 Transient cerebral ischemic attack, unspecified: Secondary | ICD-10-CM | POA: Diagnosis not present

## 2019-06-19 IMAGING — MG 2D DIGITAL SCREENING BILATERAL MAMMOGRAM WITH 3D TOMO WITH CAD
6 of 10 series · 6 of 26 positions shown · non-contrast
Comparison: Previous exam(s).

CLINICAL DATA: Screening.

EXAM:
2D DIGITAL SCREENING BILATERAL MAMMOGRAM WITH 3D TOMO WITH CAD

[L MLO (1 of 2)]
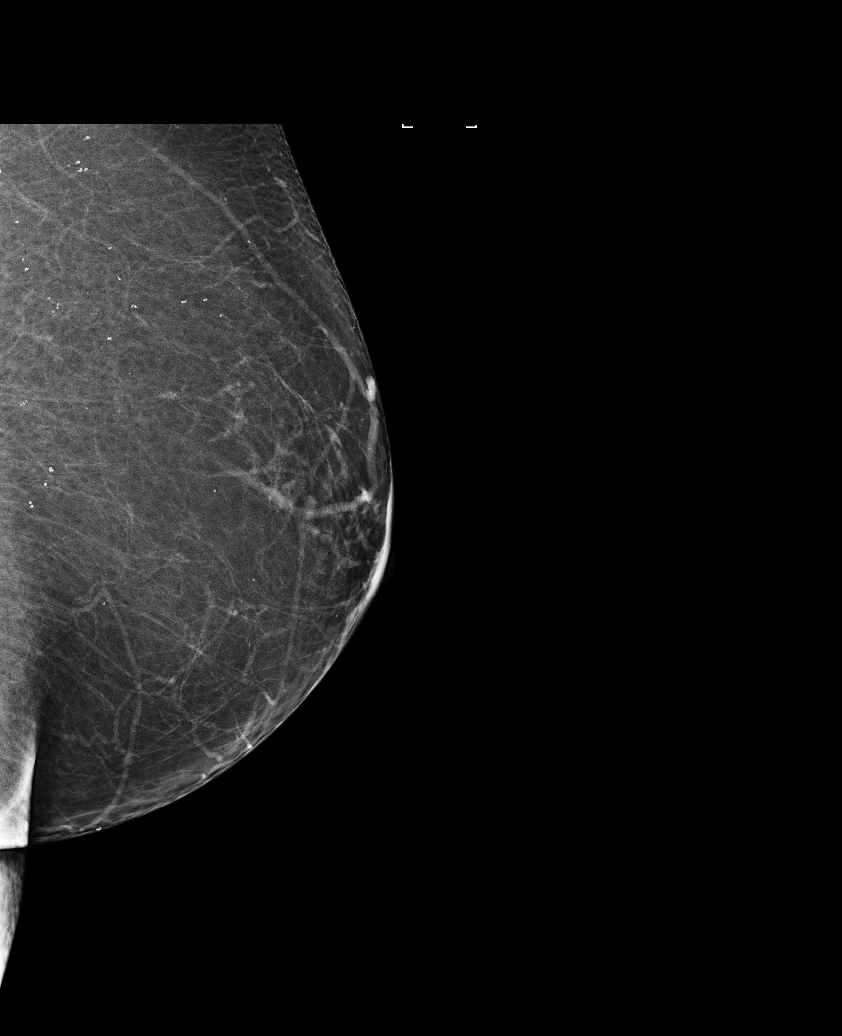

[R MLO (1 of 2)]
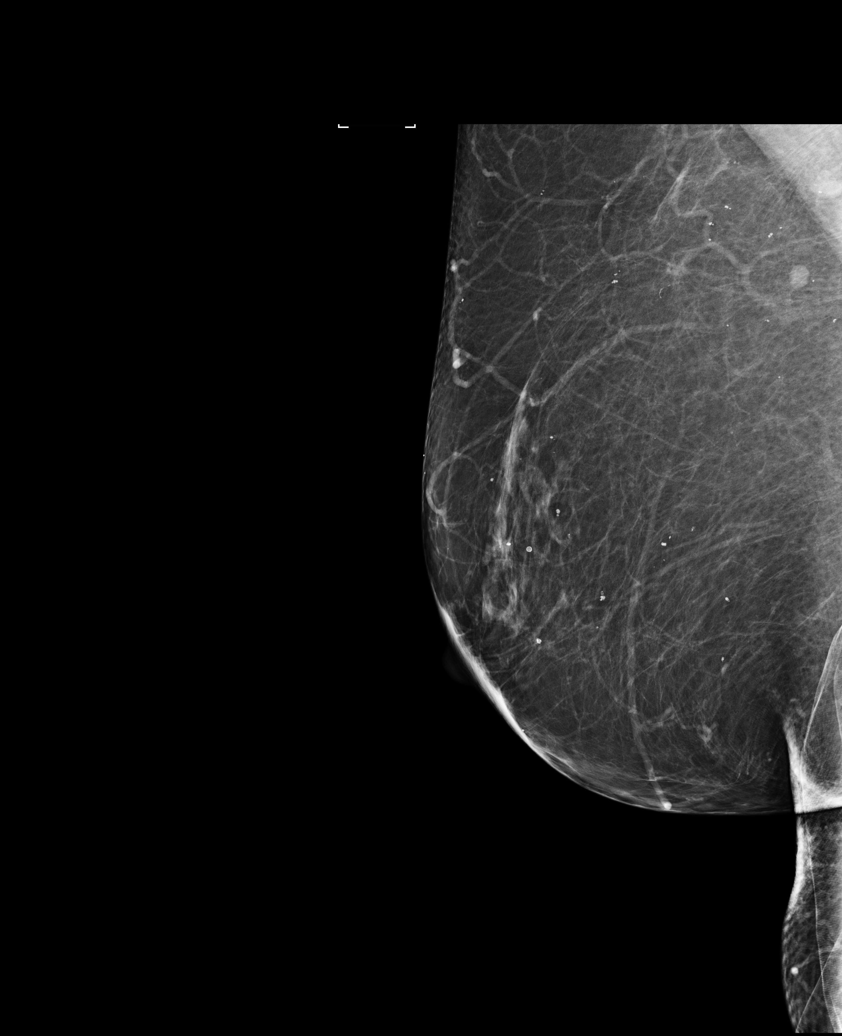

[L CC]
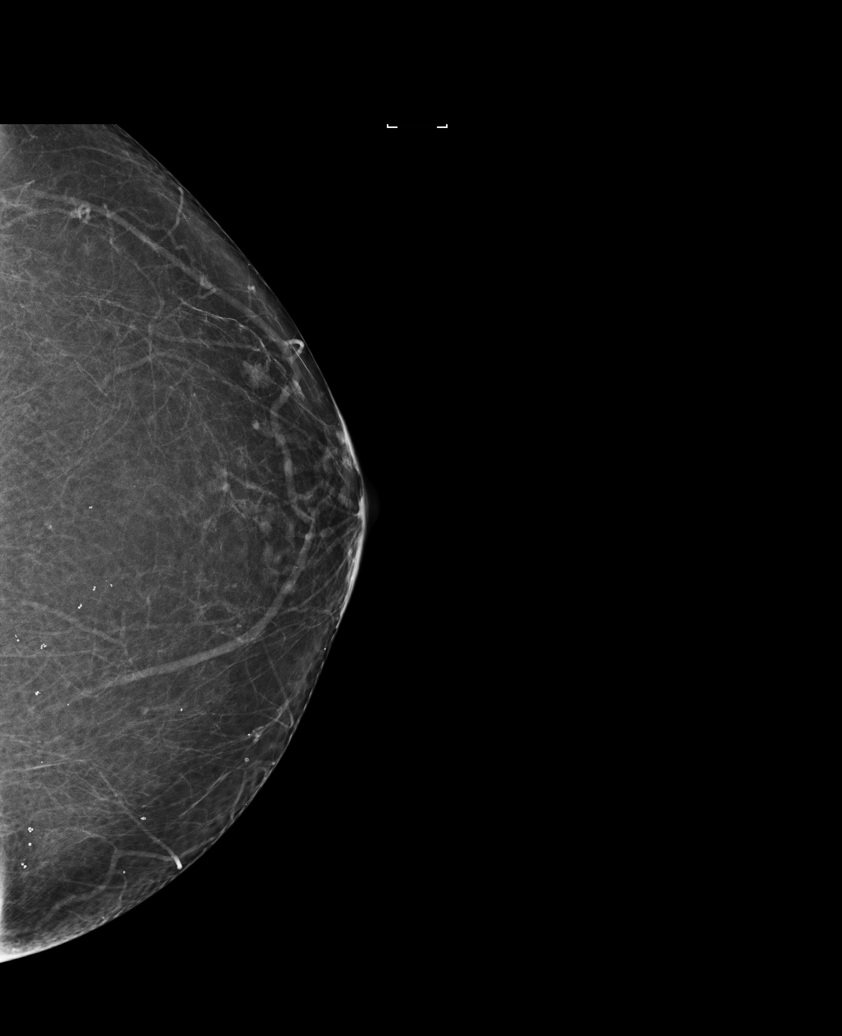

[R CC]
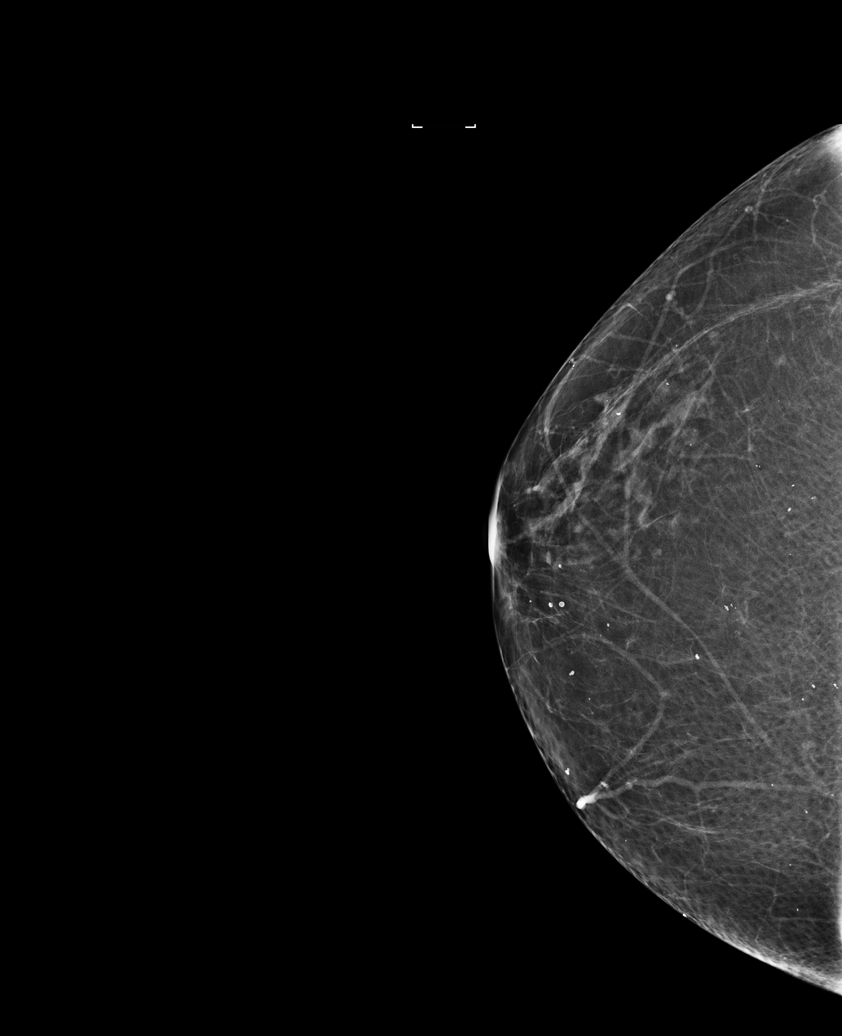

[L MLO (2 of 2)]
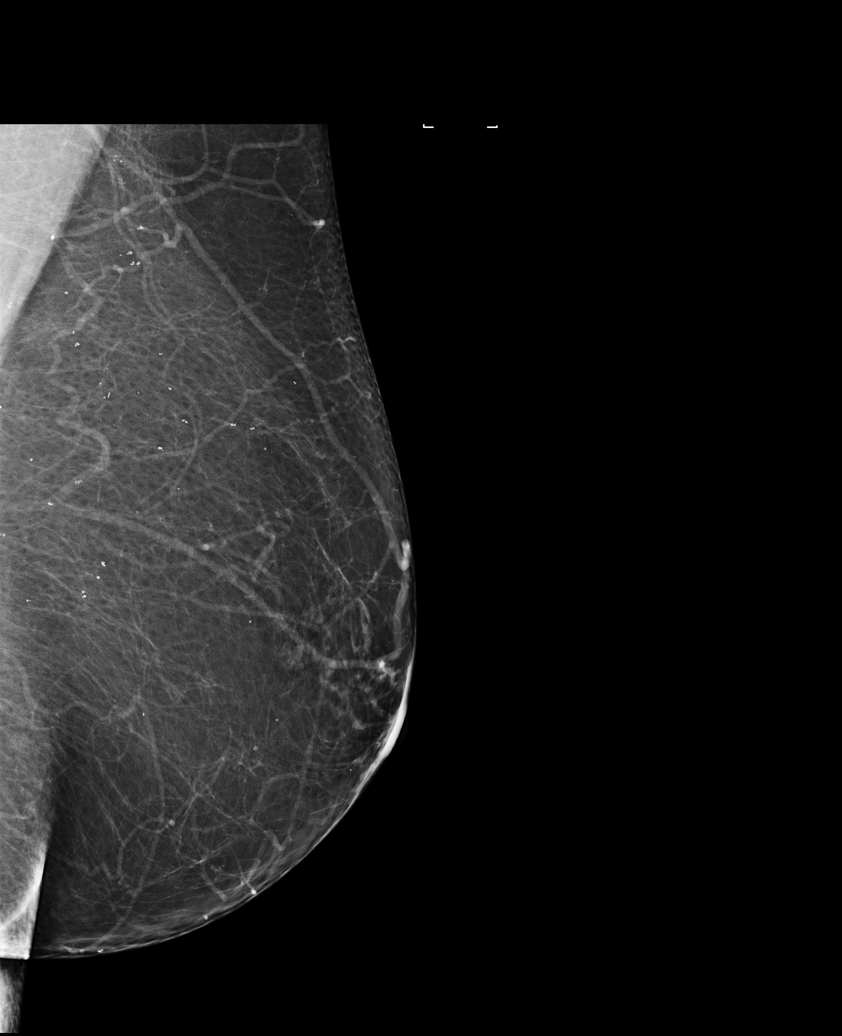

[R MLO (2 of 2)]
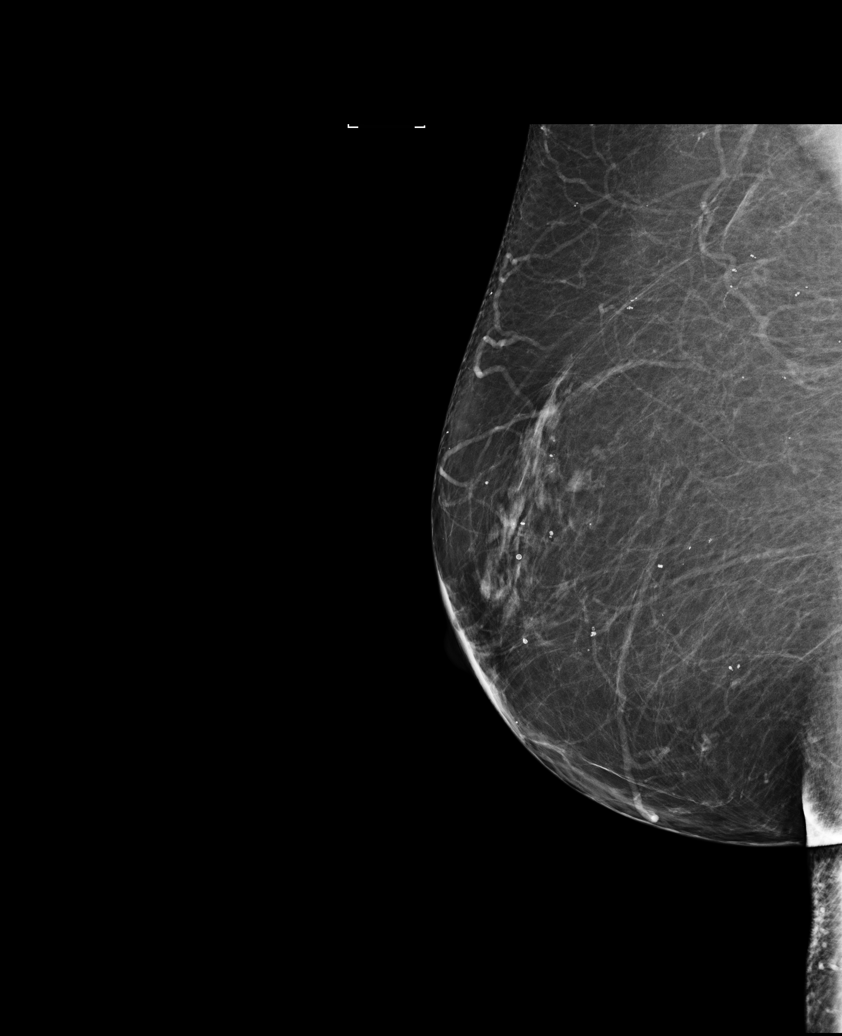

[6 of 26 positions shown; findings below may reference images not displayed]

ACR Breast Density Category b: There are scattered areas of
fibroglandular density.
FINDINGS: There are no findings suspicious for malignancy. Images were
processed with CAD.
IMPRESSION: No mammographic evidence of malignancy. A result letter of this
screening mammogram will be mailed directly to the patient.

RECOMMENDATION:
Screening mammogram in one year. (Code:GE-P-ZS0)

BI-RADS CATEGORY  1: Negative.

## 2019-06-27 DIAGNOSIS — E039 Hypothyroidism, unspecified: Secondary | ICD-10-CM | POA: Diagnosis not present

## 2019-06-27 DIAGNOSIS — E559 Vitamin D deficiency, unspecified: Secondary | ICD-10-CM | POA: Diagnosis not present

## 2019-06-27 DIAGNOSIS — E7849 Other hyperlipidemia: Secondary | ICD-10-CM | POA: Diagnosis not present

## 2019-08-14 ENCOUNTER — Other Ambulatory Visit (HOSPITAL_COMMUNITY): Payer: Self-pay | Admitting: Family Medicine

## 2019-08-14 DIAGNOSIS — Z1231 Encounter for screening mammogram for malignant neoplasm of breast: Secondary | ICD-10-CM

## 2019-08-22 ENCOUNTER — Other Ambulatory Visit: Payer: Self-pay

## 2019-08-22 ENCOUNTER — Ambulatory Visit (HOSPITAL_COMMUNITY)
Admission: RE | Admit: 2019-08-22 | Discharge: 2019-08-22 | Disposition: A | Discharge status: Home / Self Care | Payer: Medicare Other | Source: Ambulatory Visit | Attending: Family Medicine | Admitting: Family Medicine

## 2019-08-22 DIAGNOSIS — Z1231 Encounter for screening mammogram for malignant neoplasm of breast: Secondary | ICD-10-CM | POA: Insufficient documentation

## 2019-08-27 DIAGNOSIS — E7849 Other hyperlipidemia: Secondary | ICD-10-CM | POA: Diagnosis not present

## 2019-08-27 DIAGNOSIS — E559 Vitamin D deficiency, unspecified: Secondary | ICD-10-CM | POA: Diagnosis not present

## 2019-08-27 DIAGNOSIS — E039 Hypothyroidism, unspecified: Secondary | ICD-10-CM | POA: Diagnosis not present

## 2019-09-11 DIAGNOSIS — L570 Actinic keratosis: Secondary | ICD-10-CM | POA: Diagnosis not present

## 2019-09-11 DIAGNOSIS — Z85820 Personal history of malignant melanoma of skin: Secondary | ICD-10-CM | POA: Diagnosis not present

## 2019-09-11 DIAGNOSIS — Z1283 Encounter for screening for malignant neoplasm of skin: Secondary | ICD-10-CM | POA: Diagnosis not present

## 2019-09-11 DIAGNOSIS — Z08 Encounter for follow-up examination after completed treatment for malignant neoplasm: Secondary | ICD-10-CM | POA: Diagnosis not present

## 2019-09-11 DIAGNOSIS — L920 Granuloma annulare: Secondary | ICD-10-CM | POA: Diagnosis not present

## 2019-10-22 IMAGING — CT CT ABD-PELV W/O CM
2 of 4 series · 17 of 46 positions shown, 19 images · non-contrast
Comparison: 04/29/2013

CLINICAL DATA: Paraumbilical pain and discharge for 1 month.

EXAM:
CT ABDOMEN AND PELVIS WITHOUT CONTRAST
TECHNIQUE: Multidetector CT imaging of the abdomen and pelvis was performed
following the standard protocol without IV contrast.

[Series 2: axial st · axial · 0.84mm/px · z∈[+1058,+1468]mm · 14 of 90 slices shown, 16 images]
[im 4/90  soft-tissue]
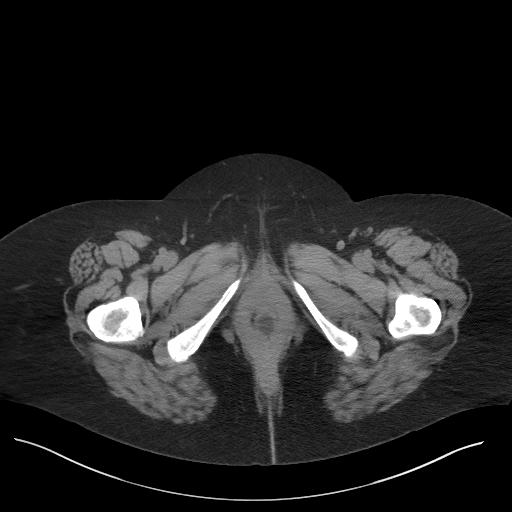
[im 4/90  bone]
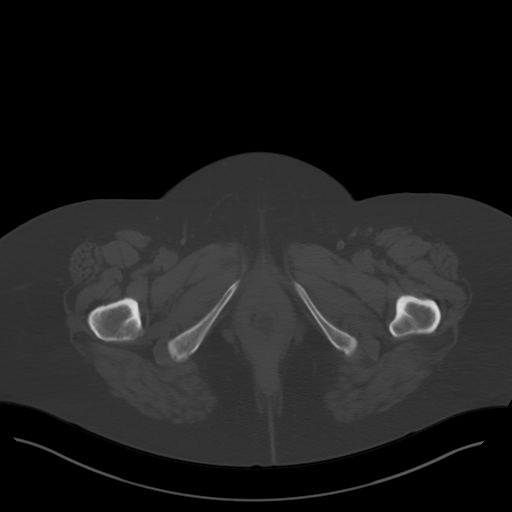
[im 12/90  soft-tissue]
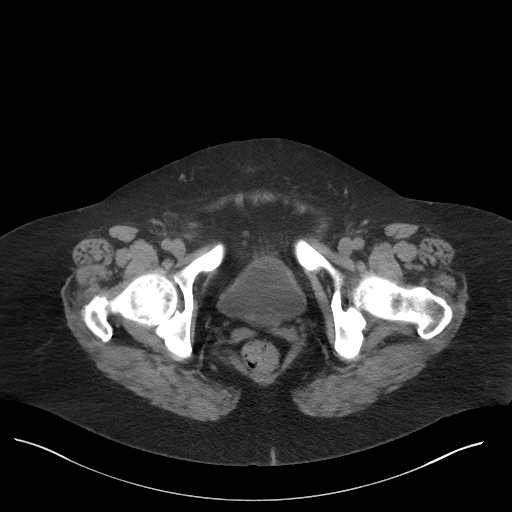
[im 16/90  soft-tissue]
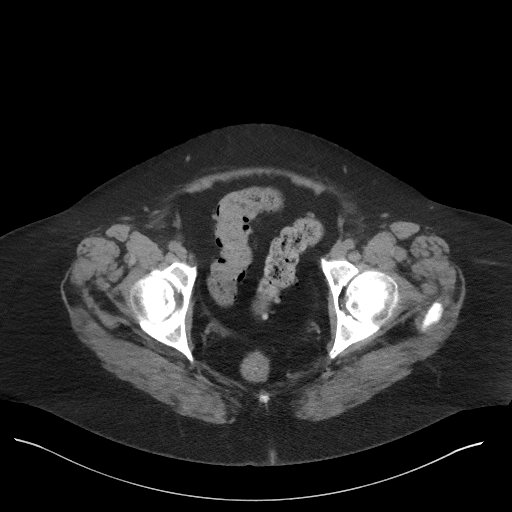
[im 24/90  soft-tissue]
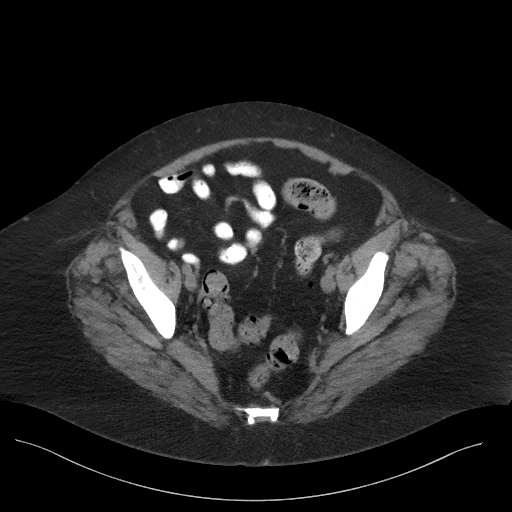
[im 31/90  soft-tissue]
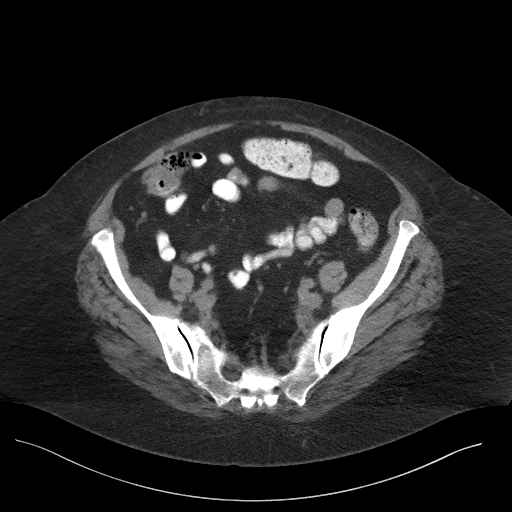
[im 35/90  soft-tissue]
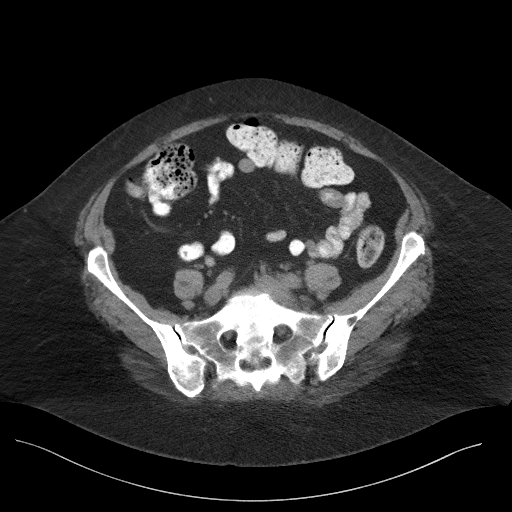
[im 43/90  soft-tissue]
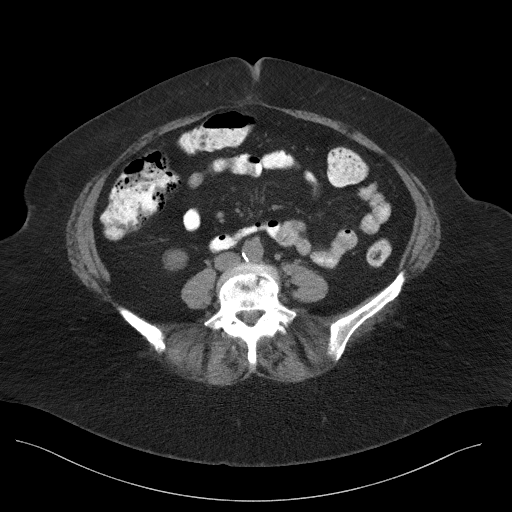
[im 47/90  soft-tissue]
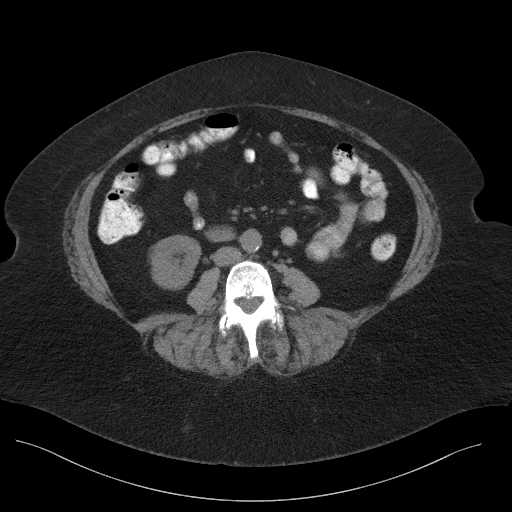
[im 55/90  soft-tissue]
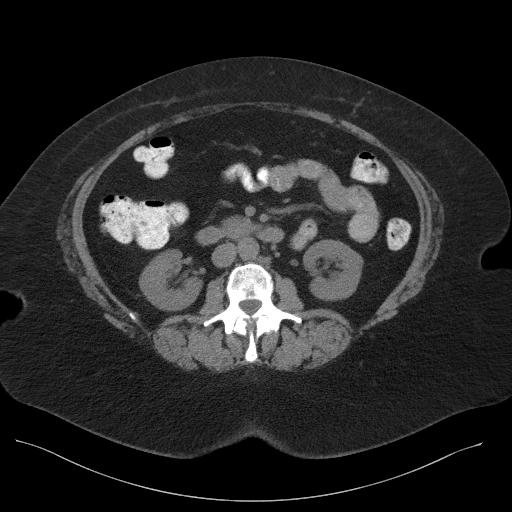
[im 55/90  bone]
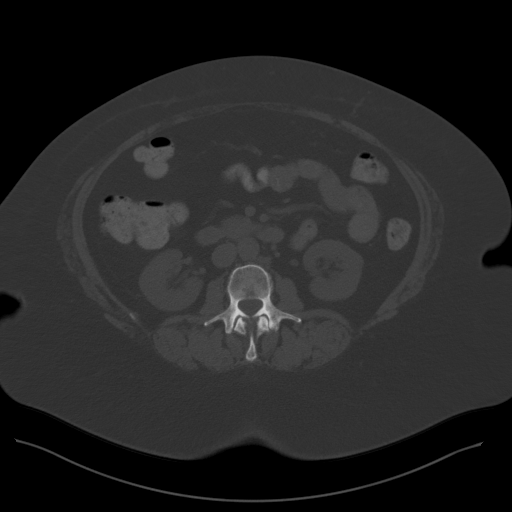
[im 59/90  soft-tissue]
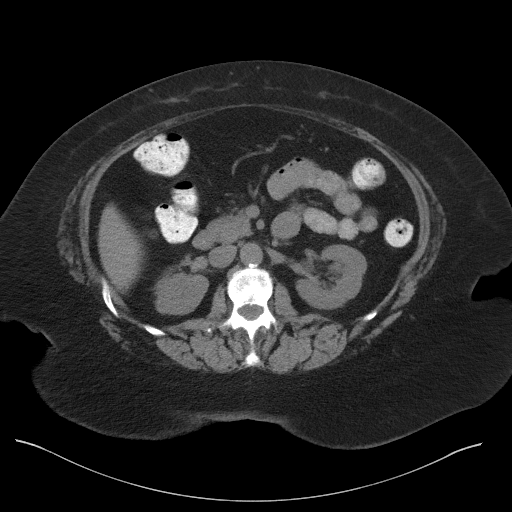
[im 66/90  soft-tissue]
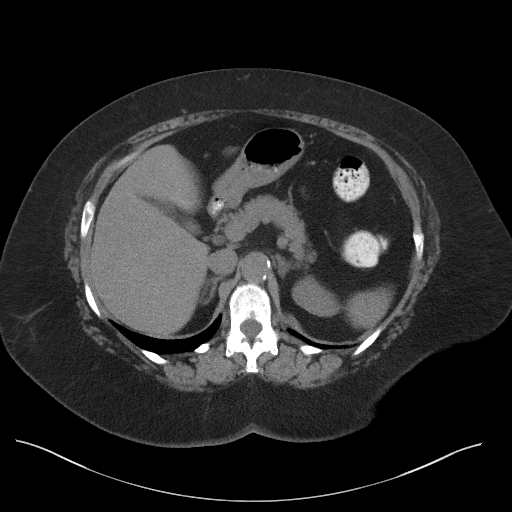
[im 74/90  soft-tissue]
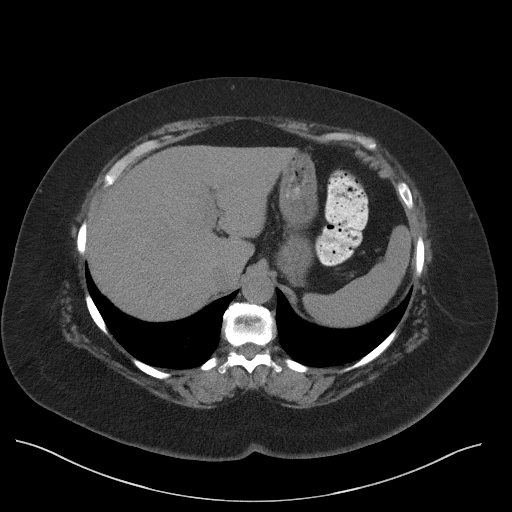
[im 78/90  soft-tissue]
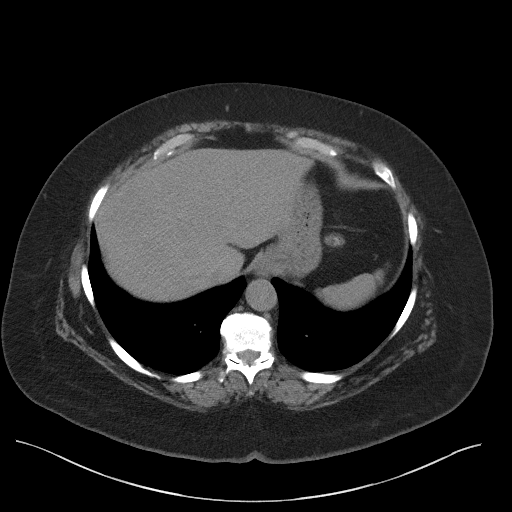
[im 86/90  soft-tissue]
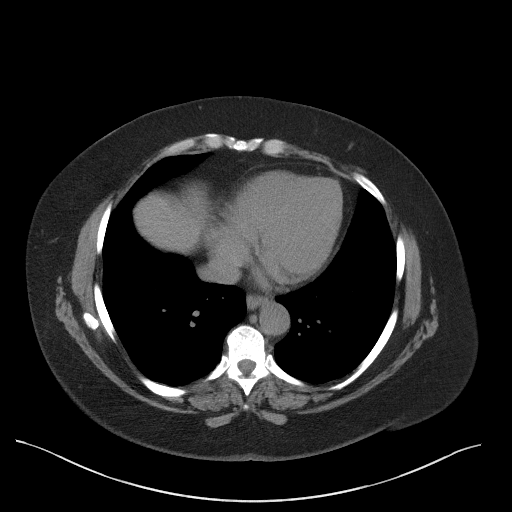

[Series 5: coronal st · coronal · 0.88mm/px · 3 of 122 slices shown]
[im 41/122  soft-tissue]
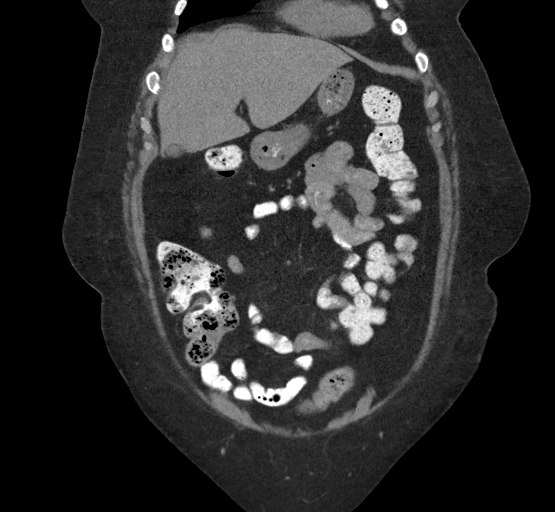
[im 54/122  soft-tissue]
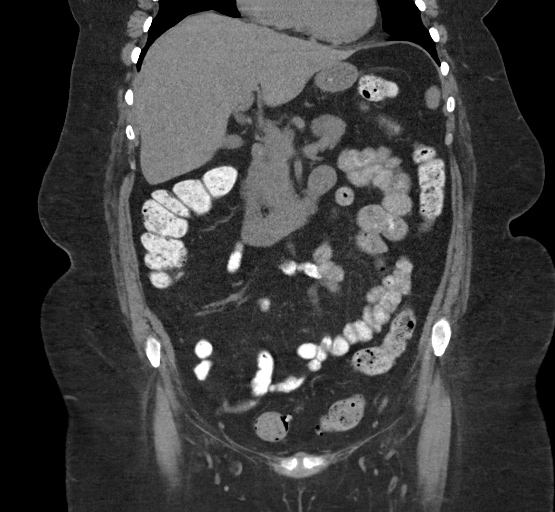
[im 68/122  soft-tissue]
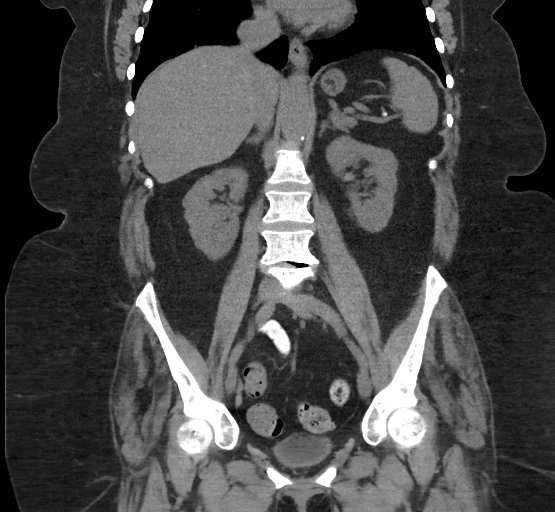

[17 of 46 positions shown; findings below may reference images not displayed]

FINDINGS: Lower chest: No acute findings.

Hepatobiliary: No mass visualized on this unenhanced exam.
Gallbladder is unremarkable.

Pancreas: No mass or inflammatory process visualized on this
unenhanced exam.

Spleen:  Within normal limits in size.

Adrenals/Urinary tract: No evidence of urolithiasis or
hydronephrosis. Pelvic floor laxity is noted with mild to moderate
cystocele.

Stomach/Bowel: No evidence of obstruction, inflammatory process, or
abnormal fluid collections. Diverticulosis is seen mainly involving
the sigmoid colon, however there is no evidence of diverticulitis.

Vascular/Lymphatic: No pathologically enlarged lymph nodes
identified. No evidence of abdominal aortic aneurysm. Aortic
atherosclerosis.

Reproductive: Prior hysterectomy noted. Adnexal regions are
unremarkable in appearance.

Other: No evidence of paraumbilical hernia, mass, fluid collection,
or inflammatory changes.

Musculoskeletal:  No suspicious bone lesions identified.
IMPRESSION: No evidence of paraumbilical hernia, mass, or fluid collection.

Mild sigmoid diverticulosis, without radiographic evidence of
diverticulitis or other acute findings.

Pelvic floor laxity, with mild-to-moderate cystocele.

## 2019-10-28 DIAGNOSIS — E039 Hypothyroidism, unspecified: Secondary | ICD-10-CM | POA: Diagnosis not present

## 2019-10-28 DIAGNOSIS — E7849 Other hyperlipidemia: Secondary | ICD-10-CM | POA: Diagnosis not present

## 2019-10-28 DIAGNOSIS — E559 Vitamin D deficiency, unspecified: Secondary | ICD-10-CM | POA: Diagnosis not present

## 2019-11-18 ENCOUNTER — Telehealth: Payer: Self-pay | Admitting: Adult Health

## 2019-11-18 MED ORDER — TERCONAZOLE 0.4 % VA CREA: 1.0000 | TOPICAL_CREAM | Freq: Every day | VAGINAL | 1 refills | Status: DC

## 2019-11-18 NOTE — Telephone Encounter (Signed)
Will refill terazol.

## 2019-11-18 NOTE — Telephone Encounter (Signed)
Patient would like refill on terazol vaginal cream sent to her pharmacy

## 2019-11-18 NOTE — Addendum Note (Signed)
Addended by: Derrek Monaco A on: 11/18/2019 02:54 PM   Modules accepted: Orders

## 2019-11-18 NOTE — Telephone Encounter (Addendum)
Pt has vaginal itching and is requesting Terazol vaginal cream. Thanks!! [place]

## 2019-12-03 DIAGNOSIS — Z23 Encounter for immunization: Secondary | ICD-10-CM | POA: Diagnosis not present

## 2019-12-18 ENCOUNTER — Other Ambulatory Visit: Payer: Medicare Other | Admitting: Adult Health

## 2019-12-18 DIAGNOSIS — K429 Umbilical hernia without obstruction or gangrene: Secondary | ICD-10-CM | POA: Diagnosis not present

## 2019-12-27 DIAGNOSIS — E7849 Other hyperlipidemia: Secondary | ICD-10-CM | POA: Diagnosis not present

## 2019-12-27 DIAGNOSIS — E039 Hypothyroidism, unspecified: Secondary | ICD-10-CM | POA: Diagnosis not present

## 2020-01-15 ENCOUNTER — Ambulatory Visit (INDEPENDENT_AMBULATORY_CARE_PROVIDER_SITE_OTHER): Payer: Medicare Other | Admitting: Adult Health

## 2020-01-15 ENCOUNTER — Other Ambulatory Visit: Payer: Self-pay

## 2020-01-15 ENCOUNTER — Encounter: Payer: Self-pay | Admitting: Adult Health

## 2020-01-15 VITALS — BP 124/75 | HR 83 | Ht 64.5 in | Wt 215.0 lb

## 2020-01-15 DIAGNOSIS — Z1211 Encounter for screening for malignant neoplasm of colon: Secondary | ICD-10-CM | POA: Diagnosis not present

## 2020-01-15 DIAGNOSIS — R42 Dizziness and giddiness: Secondary | ICD-10-CM

## 2020-01-15 DIAGNOSIS — N8111 Cystocele, midline: Secondary | ICD-10-CM | POA: Insufficient documentation

## 2020-01-15 DIAGNOSIS — Z01419 Encounter for gynecological examination (general) (routine) without abnormal findings: Secondary | ICD-10-CM | POA: Diagnosis not present

## 2020-01-15 DIAGNOSIS — N816 Rectocele: Secondary | ICD-10-CM | POA: Diagnosis not present

## 2020-01-15 DIAGNOSIS — Z780 Asymptomatic menopausal state: Secondary | ICD-10-CM | POA: Insufficient documentation

## 2020-01-15 DIAGNOSIS — R29890 Loss of height: Secondary | ICD-10-CM | POA: Insufficient documentation

## 2020-01-15 LAB — HEMOCCULT GUIAC POC 1CARD (OFFICE): Fecal Occult Blood, POC: NEGATIVE

## 2020-01-15 MED ORDER — MECLIZINE HCL 25 MG PO TABS: 25.0000 mg | ORAL_TABLET | Freq: Three times a day (TID) | ORAL | 0 refills | Status: DC | PRN

## 2020-01-15 MED ORDER — TRIAMCINOLONE ACETONIDE 0.5 % EX OINT: 1.0000 "application " | TOPICAL_OINTMENT | Freq: Two times a day (BID) | CUTANEOUS | 1 refills | Status: DC

## 2020-01-15 NOTE — Progress Notes (Signed)
Patient ID: Stacy Herrera, female   DOB: 14-Feb-1946, 74 y.o.   MRN: [id number]  History of Present Illness: Stacy Herrera is a 74 year old white female, divorced, sp hysterectomy in for a well woman gyn exam. She is having some dizziness esp with changes on positions and has itching in clitoral area. PCP is Dr Hilma Favors.    Current Medications, Allergies, Past Medical History, Past Surgical History, Family History and Social History were reviewed in [employer] record.     Review of Systems:  Patient denies any headaches, hearing loss, fatigue, blurred vision, shortness of breath, chest pain, abdominal pain, problems with bowel movements(has to go esp after eating), urination(has frequency), or intercourse(not active). No joint pain or mood swings. See HPI for positives   Physical Exam:BP 124/75 (BP Location: Left Arm, Patient Position: Sitting, Cuff Size: Large)   Pulse 83   Ht 5' 4.5" (1.638 m)   Wt 215 lb (97.5 kg)   BMI 36.33 kg/m  General:  Well developed, well nourished, no acute distress Skin:  Warm and dry Neck:  Midline trachea, normal thyroid, good ROM, no lymphadenopathy,no carotid bruits heard Lungs; Clear to auscultation bilaterally Breast:  No dominant palpable mass, retraction, or nipple discharge,sp breast reduction years ago  Cardiovascular: Regular rate and rhythm Abdomen:  Soft, non tender, no hepatosplenomegaly,has umbilical hernia to get repaired in January  Pelvic:  External genitalia is normal in appearance, no lesions.  The vagina has decreased color, moisture and rugae, has cystocele. Urethra has no lesions or masses. The cervix and uterus are absent. No adnexal masses or tenderness noted.Bladder is non tender, no masses felt. Rectal: Good sphincter tone, no polyps, or hemorrhoids felt.  Hemoccult negative.+rectocele  Extremities/musculoskeletal:  No swelling or varicosities noted, no clubbing or cyanosis Psych:  No mood changes, alert and  cooperative,seems happy AA is 0 Fall risk is low PHQ 9 score is 0   Upstream - 01/15/20 1024      Pregnancy Intention Screening   Does the patient want to become pregnant in the next year? N/A    Does the patient's partner want to become pregnant in the next year? N/A    Would the patient like to discuss contraceptive options today? N/A      Contraception Wrap Up   Current Method No Method - Other Reason   hyst   End Method No Method - Other Reason   hyst   Contraception Counseling Provided No         Examination chaperoned by Diona Fanti CMA   Impression and Plan:   1. Encounter for well woman exam with routine gynecological exam Physical in 2 years Mammogram yearly Labs with PCP Colonoscopy per GI Try kenalog cream for itching near clitoris   2. Encounter for screening fecal occult blood testing  3. Midline cystocele  4. Rectocele  5. Spell of dizziness Can try Antivert  6. Postmenopausal Will get DEXA scheduled 12/2 at 12:30 pm at [facility]   7. Loss of height Will get DEXA

## 2020-01-29 ENCOUNTER — Inpatient Hospital Stay (HOSPITAL_COMMUNITY): Admission: RE | Admit: 2020-01-29 | Payer: Medicare Other | Source: Ambulatory Visit

## 2020-02-12 ENCOUNTER — Telehealth: Payer: Self-pay | Admitting: Adult Health

## 2020-02-12 NOTE — Telephone Encounter (Signed)
Pt missed her bone density test Can we reschedule? Patient states Thursdays are good for her    Please advise & call pt

## 2020-02-12 NOTE — Telephone Encounter (Signed)
Pt was advised she can call and reschedule her bone density test. Gave #. [place]

## 2020-02-27 DIAGNOSIS — E039 Hypothyroidism, unspecified: Secondary | ICD-10-CM | POA: Diagnosis not present

## 2020-02-27 DIAGNOSIS — E7849 Other hyperlipidemia: Secondary | ICD-10-CM | POA: Diagnosis not present

## 2020-03-04 ENCOUNTER — Other Ambulatory Visit (HOSPITAL_COMMUNITY): Payer: Medicare Other

## 2020-03-04 DIAGNOSIS — Z01818 Encounter for other preprocedural examination: Secondary | ICD-10-CM | POA: Diagnosis not present

## 2020-03-08 DIAGNOSIS — K432 Incisional hernia without obstruction or gangrene: Secondary | ICD-10-CM | POA: Diagnosis not present

## 2020-03-08 DIAGNOSIS — K439 Ventral hernia without obstruction or gangrene: Secondary | ICD-10-CM | POA: Diagnosis not present

## 2020-04-08 DIAGNOSIS — Z85820 Personal history of malignant melanoma of skin: Secondary | ICD-10-CM | POA: Diagnosis not present

## 2020-04-08 DIAGNOSIS — C4441 Basal cell carcinoma of skin of scalp and neck: Secondary | ICD-10-CM | POA: Diagnosis not present

## 2020-04-08 DIAGNOSIS — D225 Melanocytic nevi of trunk: Secondary | ICD-10-CM | POA: Diagnosis not present

## 2020-04-08 DIAGNOSIS — C44319 Basal cell carcinoma of skin of other parts of face: Secondary | ICD-10-CM | POA: Diagnosis not present

## 2020-04-08 DIAGNOSIS — Z08 Encounter for follow-up examination after completed treatment for malignant neoplasm: Secondary | ICD-10-CM | POA: Diagnosis not present

## 2020-04-08 DIAGNOSIS — L570 Actinic keratosis: Secondary | ICD-10-CM | POA: Diagnosis not present

## 2020-04-08 DIAGNOSIS — L820 Inflamed seborrheic keratosis: Secondary | ICD-10-CM | POA: Diagnosis not present

## 2020-04-08 DIAGNOSIS — Z1283 Encounter for screening for malignant neoplasm of skin: Secondary | ICD-10-CM | POA: Diagnosis not present

## 2020-04-08 DIAGNOSIS — X32XXXD Exposure to sunlight, subsequent encounter: Secondary | ICD-10-CM | POA: Diagnosis not present

## 2020-04-12 ENCOUNTER — Other Ambulatory Visit (HOSPITAL_COMMUNITY): Payer: Medicare Other

## 2020-04-22 ENCOUNTER — Ambulatory Visit (HOSPITAL_COMMUNITY)
Admission: RE | Admit: 2020-04-22 | Discharge: 2020-04-22 | Disposition: A | Discharge status: Home / Self Care | Payer: Medicare Other | Source: Ambulatory Visit | Attending: Adult Health | Admitting: Adult Health

## 2020-04-22 ENCOUNTER — Other Ambulatory Visit: Payer: Self-pay

## 2020-04-22 DIAGNOSIS — Z780 Asymptomatic menopausal state: Secondary | ICD-10-CM | POA: Diagnosis not present

## 2020-04-22 DIAGNOSIS — R29890 Loss of height: Secondary | ICD-10-CM | POA: Insufficient documentation

## 2020-04-26 DIAGNOSIS — E039 Hypothyroidism, unspecified: Secondary | ICD-10-CM | POA: Diagnosis not present

## 2020-04-26 DIAGNOSIS — E7849 Other hyperlipidemia: Secondary | ICD-10-CM | POA: Diagnosis not present

## 2020-04-28 ENCOUNTER — Telehealth: Payer: Self-pay | Admitting: Adult Health

## 2020-04-28 NOTE — Telephone Encounter (Signed)
Left message that DEXA is normal, YAY, stay active

## 2020-06-03 DIAGNOSIS — Z85820 Personal history of malignant melanoma of skin: Secondary | ICD-10-CM | POA: Diagnosis not present

## 2020-06-03 DIAGNOSIS — X32XXXD Exposure to sunlight, subsequent encounter: Secondary | ICD-10-CM | POA: Diagnosis not present

## 2020-06-03 DIAGNOSIS — L570 Actinic keratosis: Secondary | ICD-10-CM | POA: Diagnosis not present

## 2020-06-03 DIAGNOSIS — C44319 Basal cell carcinoma of skin of other parts of face: Secondary | ICD-10-CM | POA: Diagnosis not present

## 2020-06-03 DIAGNOSIS — Z08 Encounter for follow-up examination after completed treatment for malignant neoplasm: Secondary | ICD-10-CM | POA: Diagnosis not present

## 2020-06-07 IMAGING — RF DG C-ARM 61-120 MIN
1 series · 7 of 7 positions shown · non-contrast
Comparison: MRI lumbar spine dated March 04, 2018.

CLINICAL DATA: L4-L5 PLIF.

EXAM:
LUMBAR SPINE - 2-3 VIEW; DG C-ARM 61-120 MIN

[Series 1: run · 7 of 7 slices shown]
[im 1/7]
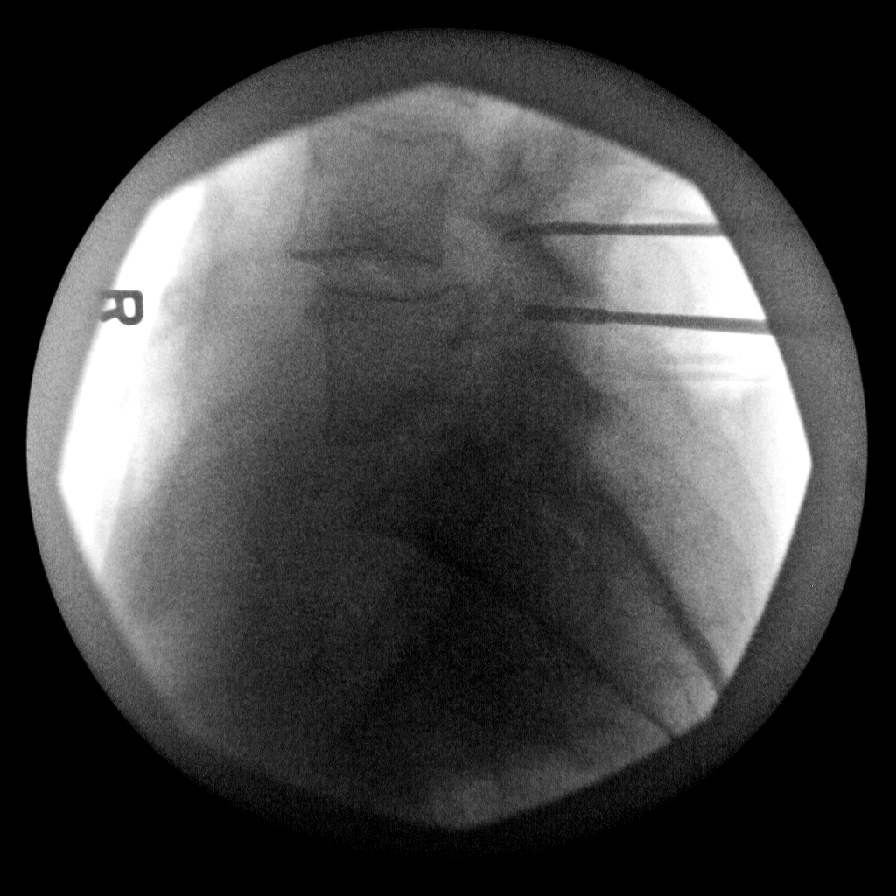
[im 2/7]
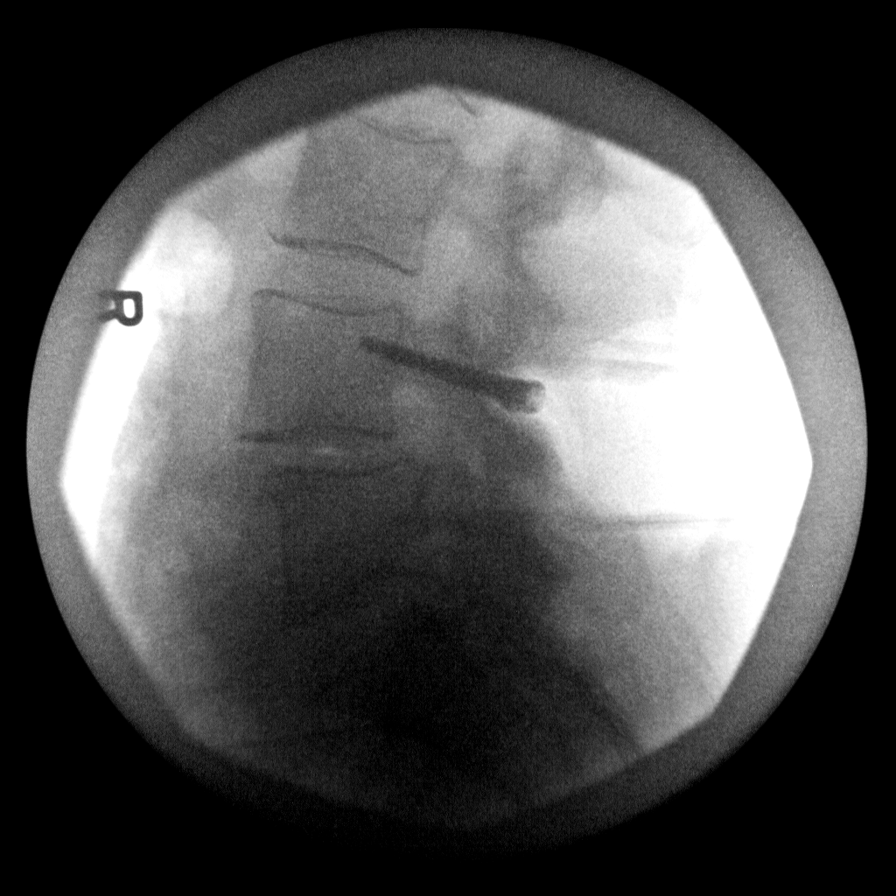
[im 3/7]
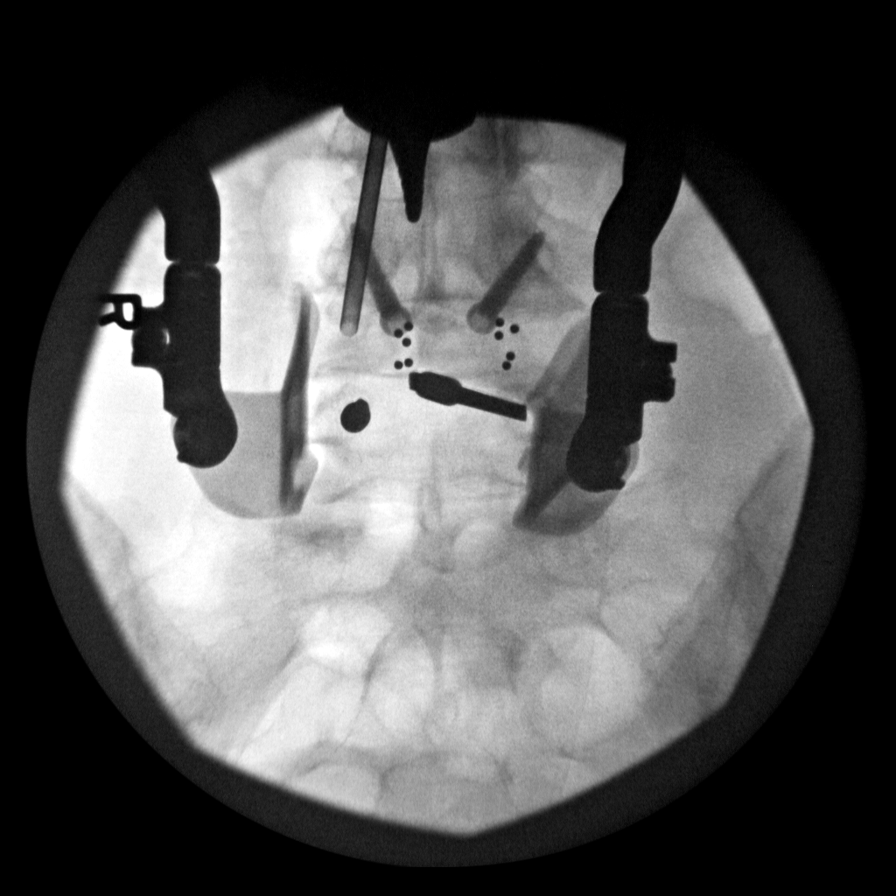
[im 4/7]
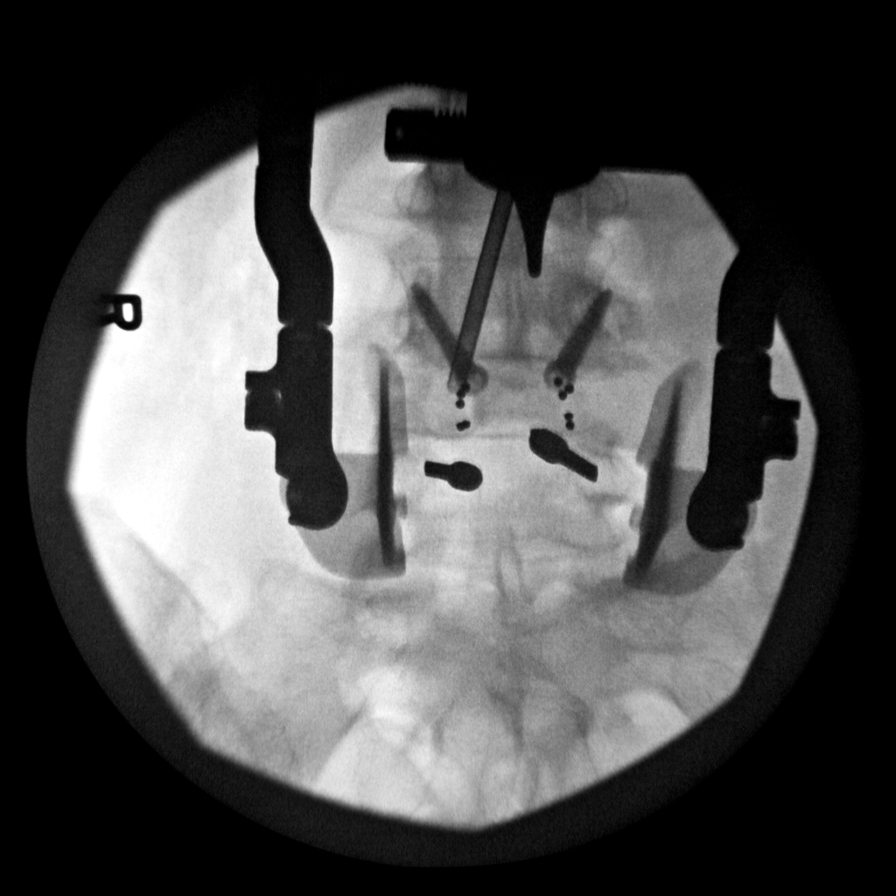
[im 5/7]
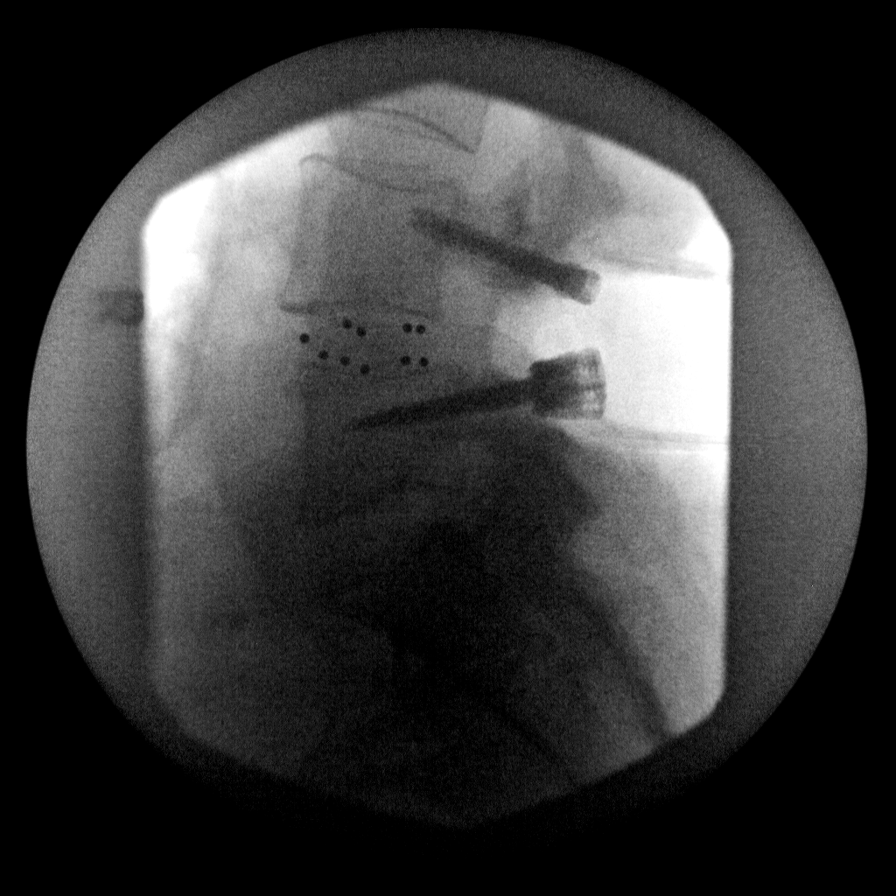
[im 6/7]
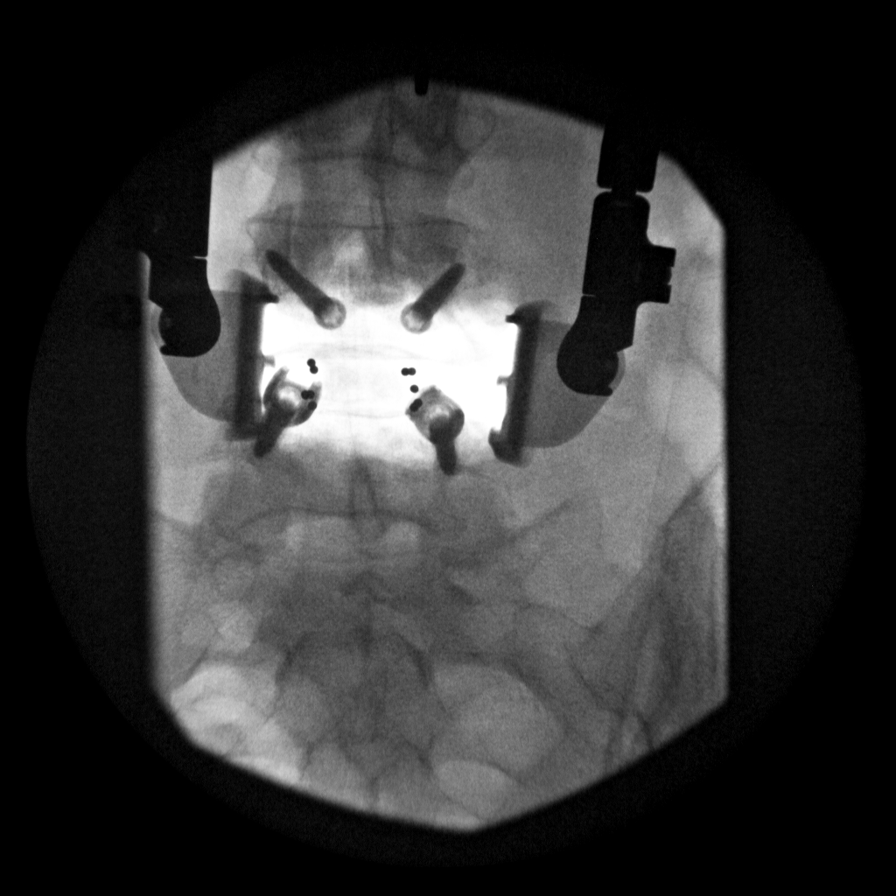
[im 7/7]
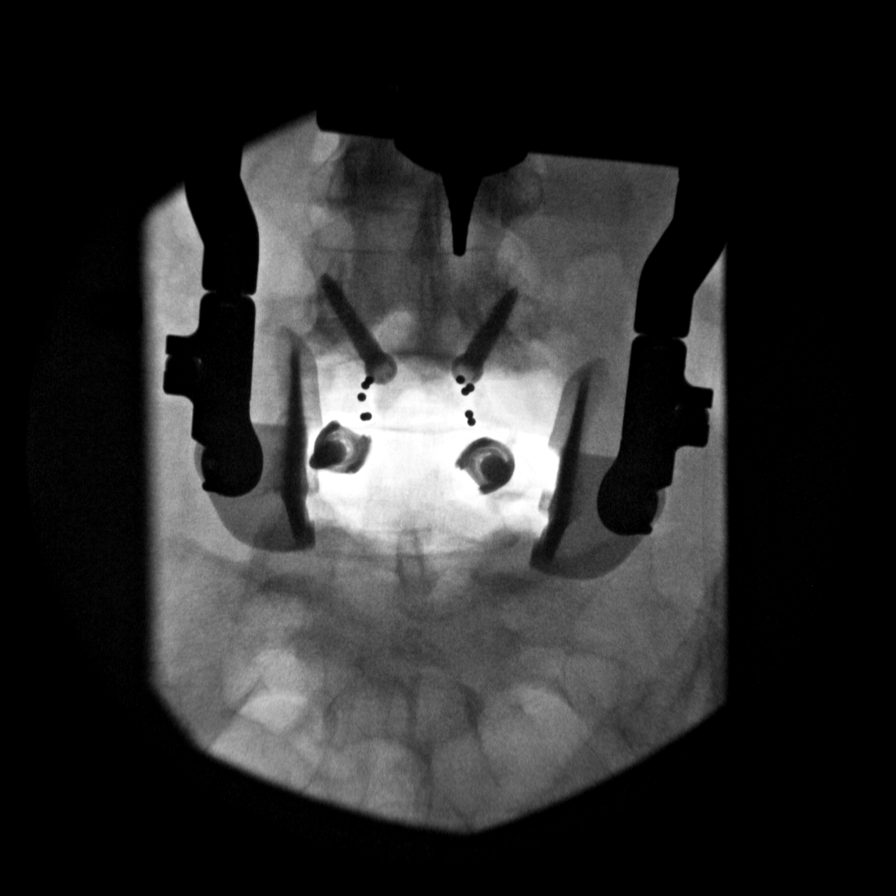

[7 of 7 positions shown; findings below may reference images not displayed]

FLUOROSCOPY TIME:  1 minutes, 16 seconds.

C-arm fluoroscopic images were obtained intraoperatively and
submitted for post operative interpretation.
FINDINGS: Multiple intraoperative fluoroscopic images demonstrate interval
posterior decompression at L4-L5 with placement of pedicle screws
and an interbody graft. Mild anterolisthesis at L4-L5 appears
slightly improved.
IMPRESSION: Intraoperative fluoroscopic guidance for L4-L5 PLIF.

## 2020-06-26 DIAGNOSIS — E7849 Other hyperlipidemia: Secondary | ICD-10-CM | POA: Diagnosis not present

## 2020-06-26 DIAGNOSIS — E039 Hypothyroidism, unspecified: Secondary | ICD-10-CM | POA: Diagnosis not present

## 2020-07-12 DIAGNOSIS — E039 Hypothyroidism, unspecified: Secondary | ICD-10-CM | POA: Diagnosis not present

## 2020-07-12 DIAGNOSIS — Z0001 Encounter for general adult medical examination with abnormal findings: Secondary | ICD-10-CM | POA: Diagnosis not present

## 2020-07-12 DIAGNOSIS — E559 Vitamin D deficiency, unspecified: Secondary | ICD-10-CM | POA: Diagnosis not present

## 2020-07-12 DIAGNOSIS — E7849 Other hyperlipidemia: Secondary | ICD-10-CM | POA: Diagnosis not present

## 2020-07-12 DIAGNOSIS — Z1389 Encounter for screening for other disorder: Secondary | ICD-10-CM | POA: Diagnosis not present

## 2020-07-12 DIAGNOSIS — R7309 Other abnormal glucose: Secondary | ICD-10-CM | POA: Diagnosis not present

## 2020-07-20 ENCOUNTER — Other Ambulatory Visit (HOSPITAL_COMMUNITY): Payer: Self-pay | Admitting: Family Medicine

## 2020-07-20 ENCOUNTER — Other Ambulatory Visit (HOSPITAL_COMMUNITY): Payer: Self-pay | Admitting: Adult Health

## 2020-07-20 DIAGNOSIS — Z1231 Encounter for screening mammogram for malignant neoplasm of breast: Secondary | ICD-10-CM

## 2020-07-22 DIAGNOSIS — Z85828 Personal history of other malignant neoplasm of skin: Secondary | ICD-10-CM | POA: Diagnosis not present

## 2020-07-22 DIAGNOSIS — Z85820 Personal history of malignant melanoma of skin: Secondary | ICD-10-CM | POA: Diagnosis not present

## 2020-07-22 DIAGNOSIS — Z08 Encounter for follow-up examination after completed treatment for malignant neoplasm: Secondary | ICD-10-CM | POA: Diagnosis not present

## 2020-07-22 DIAGNOSIS — X32XXXD Exposure to sunlight, subsequent encounter: Secondary | ICD-10-CM | POA: Diagnosis not present

## 2020-07-22 DIAGNOSIS — L570 Actinic keratosis: Secondary | ICD-10-CM | POA: Diagnosis not present

## 2020-08-26 DIAGNOSIS — E782 Mixed hyperlipidemia: Secondary | ICD-10-CM | POA: Diagnosis not present

## 2020-08-26 DIAGNOSIS — E039 Hypothyroidism, unspecified: Secondary | ICD-10-CM | POA: Diagnosis not present

## 2020-09-02 ENCOUNTER — Ambulatory Visit (HOSPITAL_COMMUNITY)
Admission: RE | Admit: 2020-09-02 | Discharge: 2020-09-02 | Disposition: A | Discharge status: Home / Self Care | Payer: Medicare Other | Source: Ambulatory Visit | Attending: Adult Health | Admitting: Adult Health

## 2020-09-02 DIAGNOSIS — Z1231 Encounter for screening mammogram for malignant neoplasm of breast: Secondary | ICD-10-CM | POA: Diagnosis not present

## 2020-10-21 DIAGNOSIS — X32XXXD Exposure to sunlight, subsequent encounter: Secondary | ICD-10-CM | POA: Diagnosis not present

## 2020-10-21 DIAGNOSIS — Z08 Encounter for follow-up examination after completed treatment for malignant neoplasm: Secondary | ICD-10-CM | POA: Diagnosis not present

## 2020-10-21 DIAGNOSIS — L820 Inflamed seborrheic keratosis: Secondary | ICD-10-CM | POA: Diagnosis not present

## 2020-10-21 DIAGNOSIS — D225 Melanocytic nevi of trunk: Secondary | ICD-10-CM | POA: Diagnosis not present

## 2020-10-21 DIAGNOSIS — Z85820 Personal history of malignant melanoma of skin: Secondary | ICD-10-CM | POA: Diagnosis not present

## 2020-10-21 DIAGNOSIS — Z1283 Encounter for screening for malignant neoplasm of skin: Secondary | ICD-10-CM | POA: Diagnosis not present

## 2020-10-21 DIAGNOSIS — L570 Actinic keratosis: Secondary | ICD-10-CM | POA: Diagnosis not present

## 2020-10-21 DIAGNOSIS — C44319 Basal cell carcinoma of skin of other parts of face: Secondary | ICD-10-CM | POA: Diagnosis not present

## 2020-11-25 DIAGNOSIS — Z08 Encounter for follow-up examination after completed treatment for malignant neoplasm: Secondary | ICD-10-CM | POA: Diagnosis not present

## 2020-11-25 DIAGNOSIS — Z85828 Personal history of other malignant neoplasm of skin: Secondary | ICD-10-CM | POA: Diagnosis not present

## 2020-11-25 DIAGNOSIS — L820 Inflamed seborrheic keratosis: Secondary | ICD-10-CM | POA: Diagnosis not present

## 2020-12-09 DIAGNOSIS — Z23 Encounter for immunization: Secondary | ICD-10-CM | POA: Diagnosis not present

## 2021-04-14 DIAGNOSIS — Z08 Encounter for follow-up examination after completed treatment for malignant neoplasm: Secondary | ICD-10-CM | POA: Diagnosis not present

## 2021-04-14 DIAGNOSIS — L570 Actinic keratosis: Secondary | ICD-10-CM | POA: Diagnosis not present

## 2021-04-14 DIAGNOSIS — D225 Melanocytic nevi of trunk: Secondary | ICD-10-CM | POA: Diagnosis not present

## 2021-04-14 DIAGNOSIS — Z1283 Encounter for screening for malignant neoplasm of skin: Secondary | ICD-10-CM | POA: Diagnosis not present

## 2021-04-14 DIAGNOSIS — Z85820 Personal history of malignant melanoma of skin: Secondary | ICD-10-CM | POA: Diagnosis not present

## 2021-04-14 DIAGNOSIS — X32XXXD Exposure to sunlight, subsequent encounter: Secondary | ICD-10-CM | POA: Diagnosis not present

## 2021-04-21 DIAGNOSIS — J019 Acute sinusitis, unspecified: Secondary | ICD-10-CM | POA: Diagnosis not present

## 2021-04-21 DIAGNOSIS — J302 Other seasonal allergic rhinitis: Secondary | ICD-10-CM | POA: Diagnosis not present

## 2021-06-27 HISTORY — PX: OTHER SURGICAL HISTORY: SHX169

## 2021-07-05 DIAGNOSIS — E7849 Other hyperlipidemia: Secondary | ICD-10-CM | POA: Diagnosis not present

## 2021-07-05 DIAGNOSIS — E039 Hypothyroidism, unspecified: Secondary | ICD-10-CM | POA: Diagnosis not present

## 2021-07-05 DIAGNOSIS — E559 Vitamin D deficiency, unspecified: Secondary | ICD-10-CM | POA: Diagnosis not present

## 2021-07-05 DIAGNOSIS — R7309 Other abnormal glucose: Secondary | ICD-10-CM | POA: Diagnosis not present

## 2021-07-05 DIAGNOSIS — E782 Mixed hyperlipidemia: Secondary | ICD-10-CM | POA: Diagnosis not present

## 2021-07-05 DIAGNOSIS — Z01818 Encounter for other preprocedural examination: Secondary | ICD-10-CM | POA: Diagnosis not present

## 2021-07-18 DIAGNOSIS — L0100 Impetigo, unspecified: Secondary | ICD-10-CM | POA: Diagnosis not present

## 2021-09-06 IMAGING — MR MR HEAD W/O CM
6 of 10 series · 28 of 48 positions shown · non-contrast
Comparison: Report of brain MRI 08/16/2000 (no images available).

CLINICAL DATA: 73-year-old female with episodes of weakness and
memory loss. No known injury.

EXAM:
MRI HEAD WITHOUT CONTRAST
TECHNIQUE: Multiplanar, multiecho pulse sequences of the brain and surrounding
structures were obtained without intravenous contrast.

[Series 2: DWI · axial · 3.0mm · 0.79mm/px · z∈[-82,+79]mm · 8 of 110 slices shown (1 of 2)]
[im 1/110]
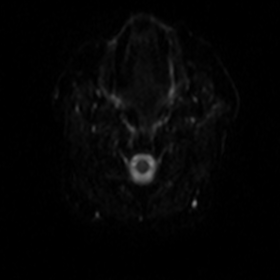
[im 13/110]
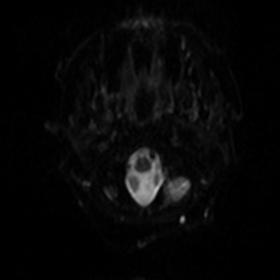
[im 37/110]
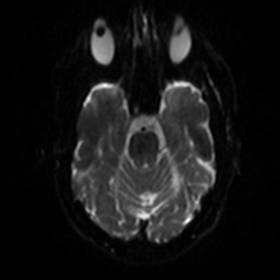
[im 49/110]
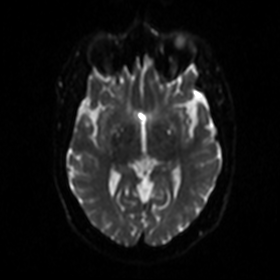
[im 61/110]
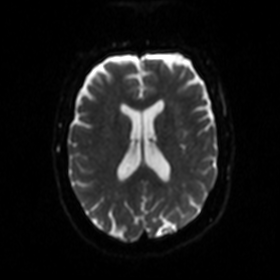
[im 73/110]
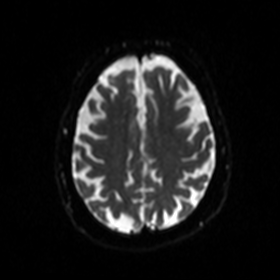
[im 97/110]
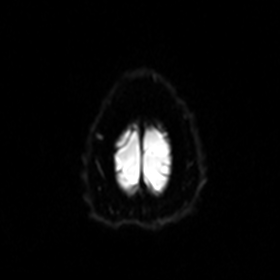
[im 110/110]
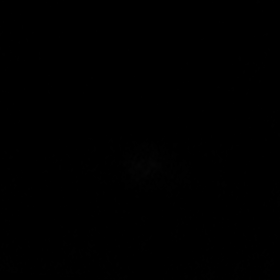

[Series 4: DWI · coronal · 5.0mm · 0.53mm/px · 7 of 76 slices shown (2 of 2)]
[im 1/76]
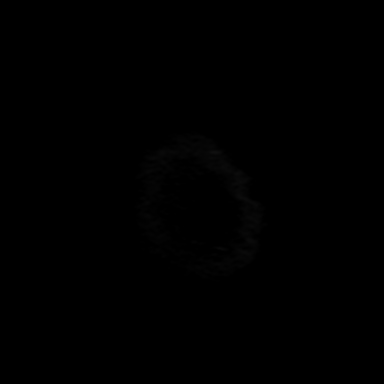
[im 13/76]
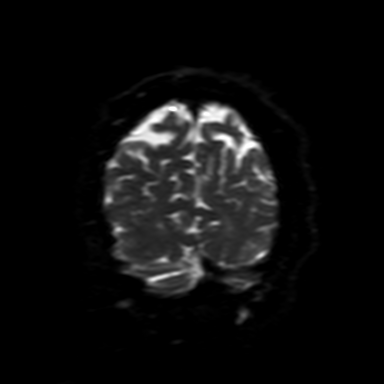
[im 26/76]
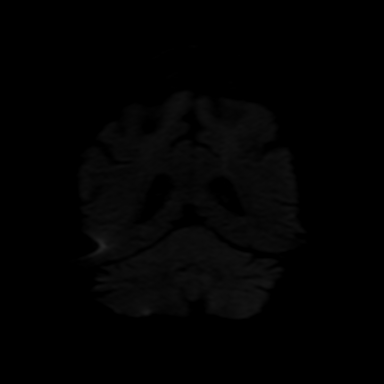
[im 38/76]
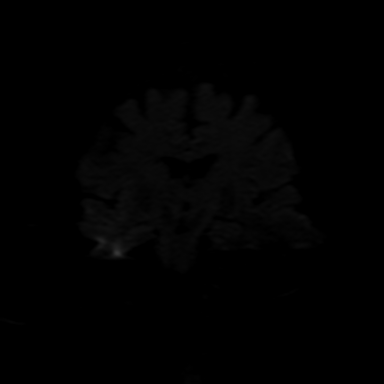
[im 51/76]
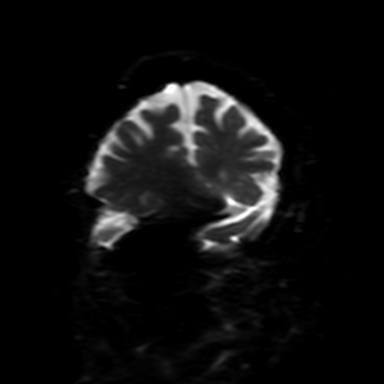
[im 63/76]
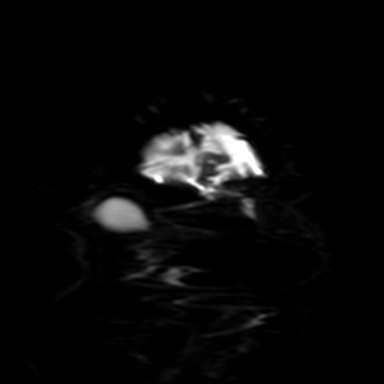
[im 76/76]
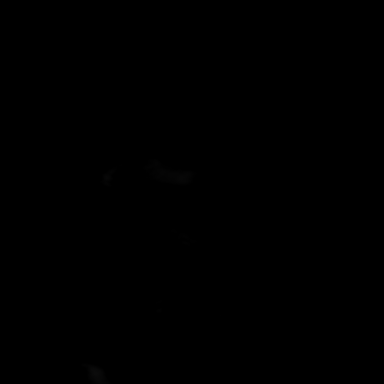

[Series 7: T2 · axial · 5.0mm · 0.46mm/px · z∈[-76,+72]mm · 2 of 24 slices shown (1 of 3)]
[im 1/24]
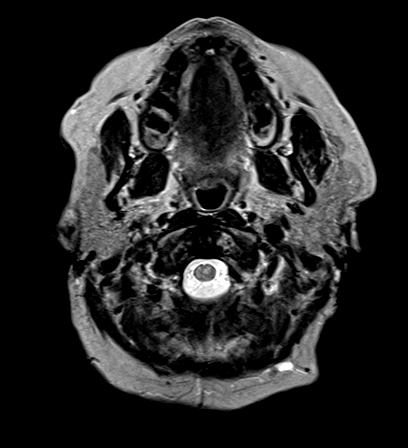
[im 24/24]
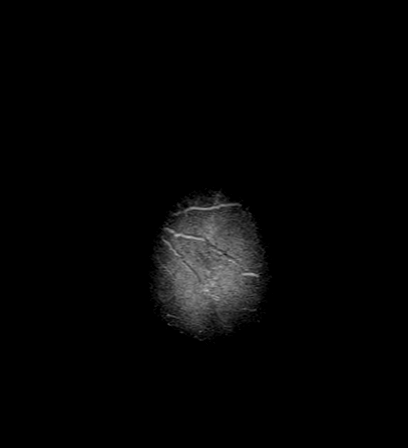

[Series 8: T2 · axial · 4.0mm · 0.41mm/px · z∈[-76,+73]mm · 3 of 31 slices shown (2 of 3)]
[im 1/31]
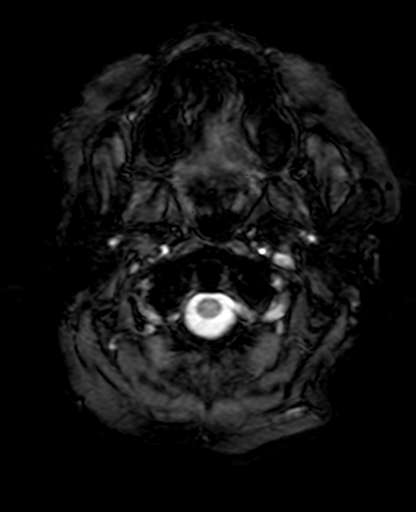
[im 16/31]
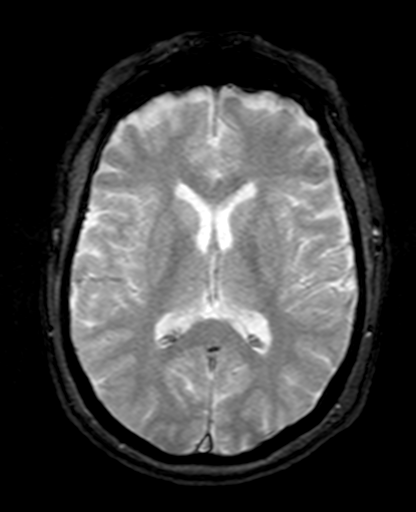
[im 31/31]
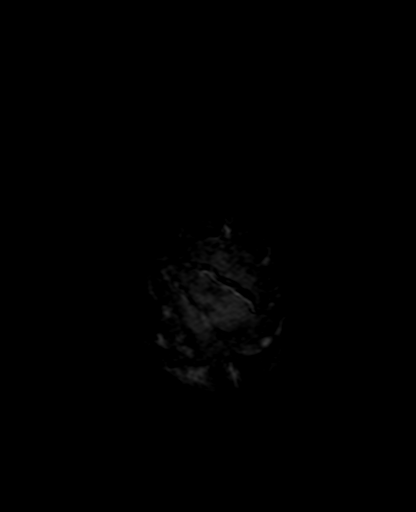

[Series 9: FLAIR · axial · 3.0mm · 0.32mm/px · z∈[-73,+70]mm · 5 of 49 slices shown]
[im 1/49]
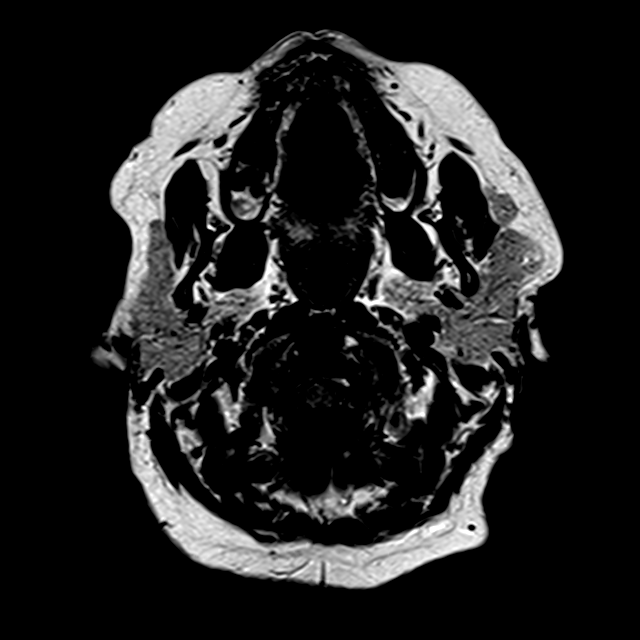
[im 13/49]
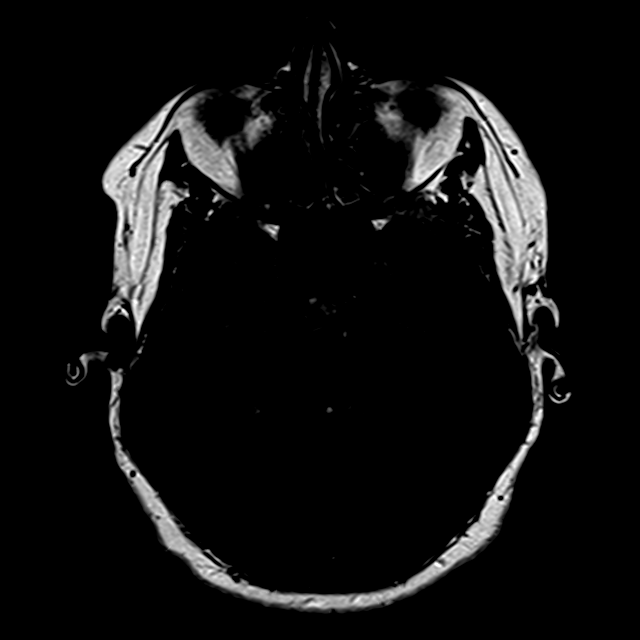
[im 25/49]
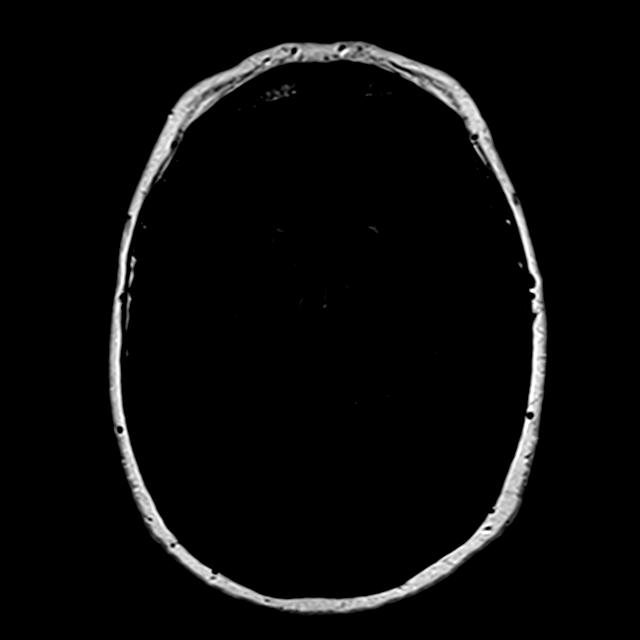
[im 37/49]
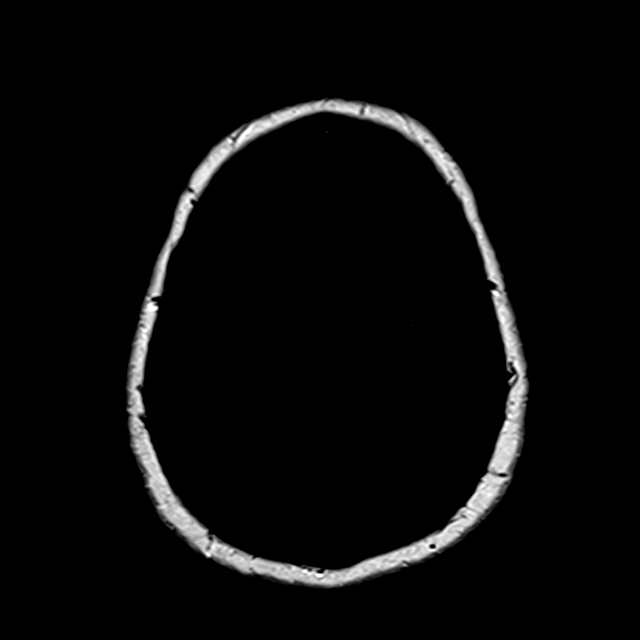
[im 49/49]
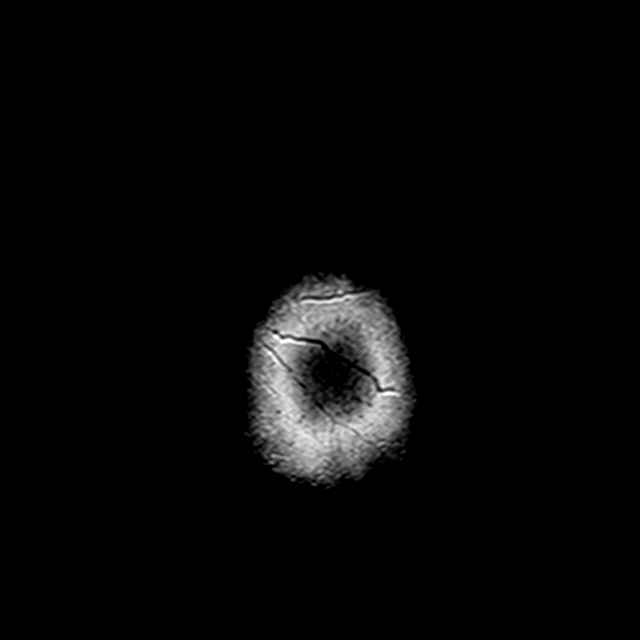

[Series 11: T2 · coronal · 5.0mm · 0.49mm/px · 3 of 28 slices shown (3 of 3)]
[im 1/28]
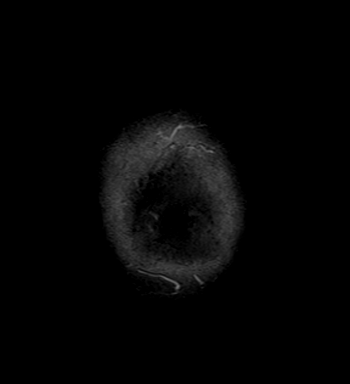
[im 14/28]
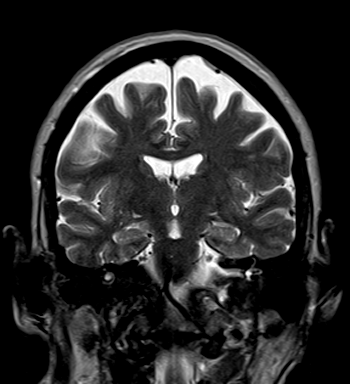
[im 28/28]
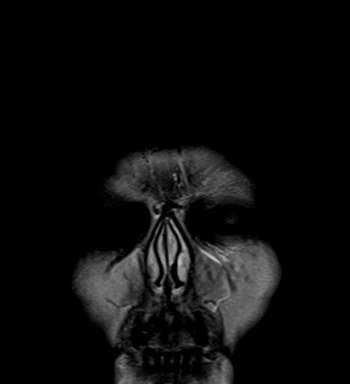

[28 of 48 positions shown; findings below may reference images not displayed]

FINDINGS: Brain: No restricted diffusion to suggest acute infarction. No
midline shift, mass effect, evidence of mass lesion,
ventriculomegaly, extra-axial collection or acute intracranial
hemorrhage. Cervicomedullary junction and pituitary are within
normal limits.

Cerebral volume is within normal limits for age. Gray and white
matter signal is largely normal for age throughout the brain. No
cortical encephalomalacia, although there are occasional chronic
micro hemorrhages in the brain (left parietal lobe series 8, image
19 and right cerebellum image 7). The deep gray nuclei and brainstem
appear normal. Mesial temporal lobe structures appear normal for
age.

Vascular: Major intracranial vascular flow voids are preserved. The
left vertebral appears mildly dominant.

Skull and upper cervical spine: Negative visible cervical spine.
Visualized bone marrow signal is within normal limits.

Sinuses/Orbits: Negative orbits. Trace paranasal sinus mucosal
thickening.

Other: Mastoids are clear. Visible internal auditory structures
appear normal. Scalp and face soft tissues appear negative.
IMPRESSION: 1. No acute intracranial abnormality
2. Largely unremarkable for age non-contrast MRI appearance of the
brain. Minimal signal changes compatible with chronic small vessel
disease.

## 2021-11-10 IMAGING — MG DIGITAL SCREENING BILAT W/ TOMO W/ CAD
6 of 10 series · 6 of 30 positions shown · non-contrast
Comparison: Previous exam(s).

ACR Breast Density Category a: The breast tissue is almost entirely
fatty.

CLINICAL DATA: Screening.

EXAM:
DIGITAL SCREENING BILATERAL MAMMOGRAM WITH TOMO AND CAD

[L CC synth-2D]
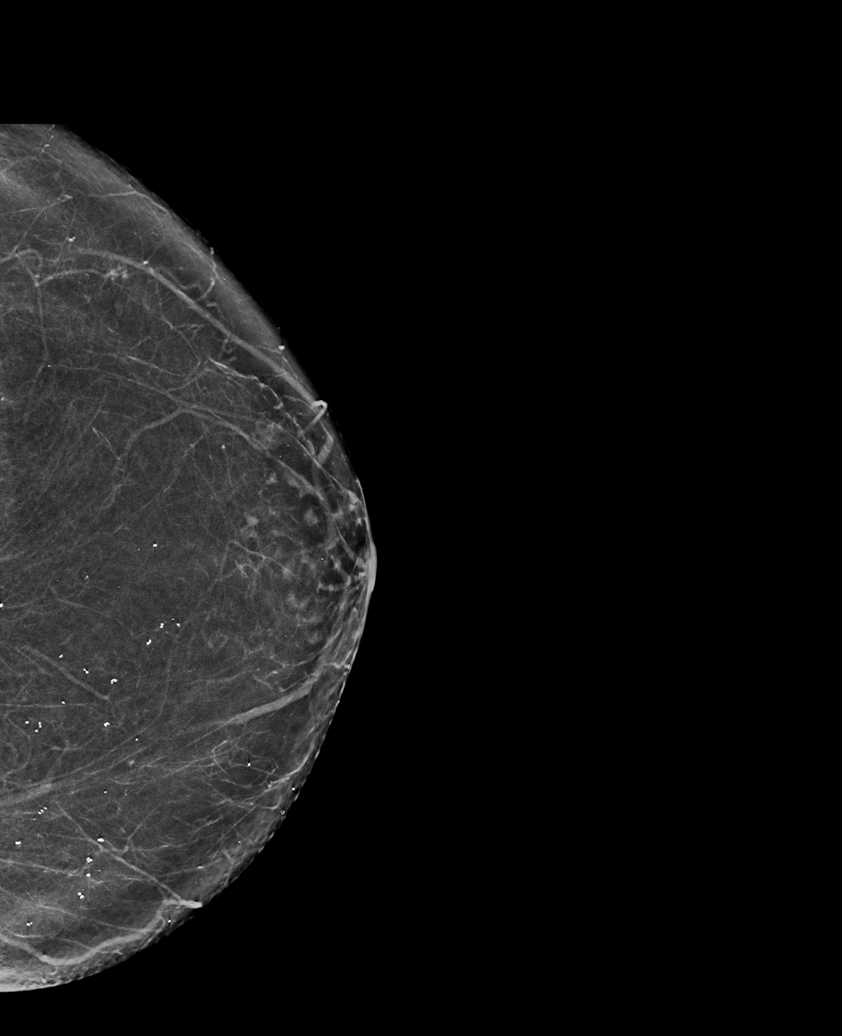

[L MLO synth-2D (1 of 2)]
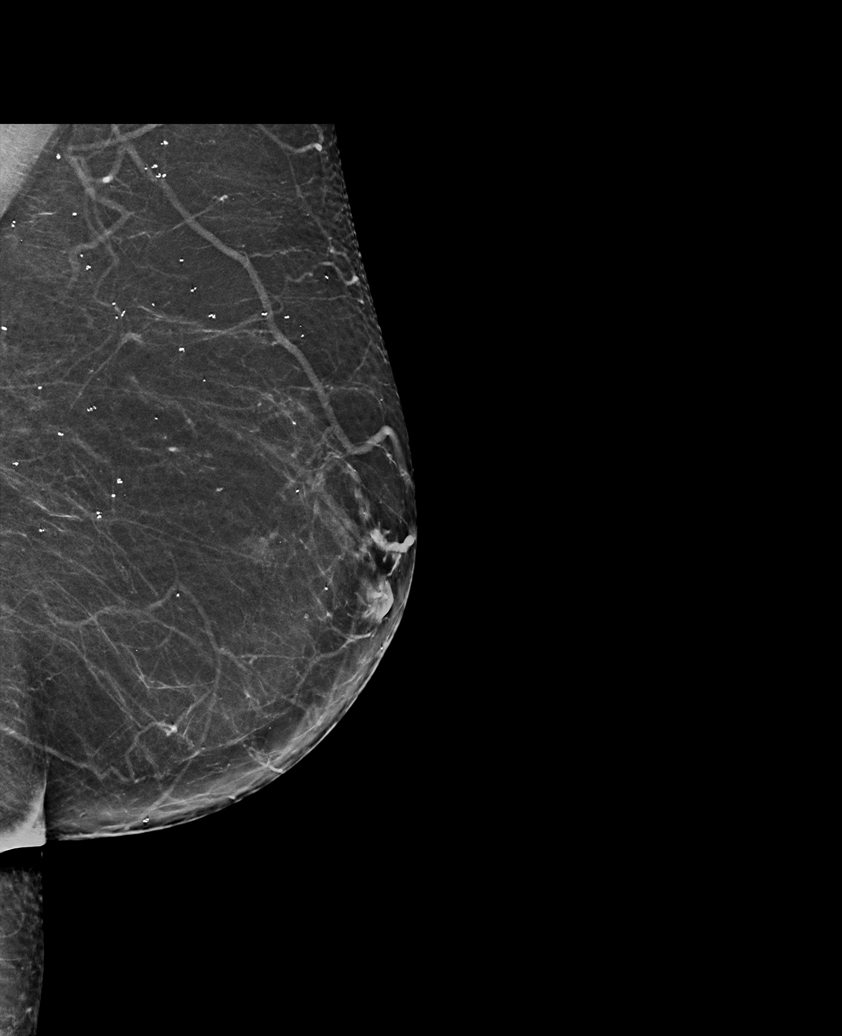

[L MLO synth-2D (2 of 2)]
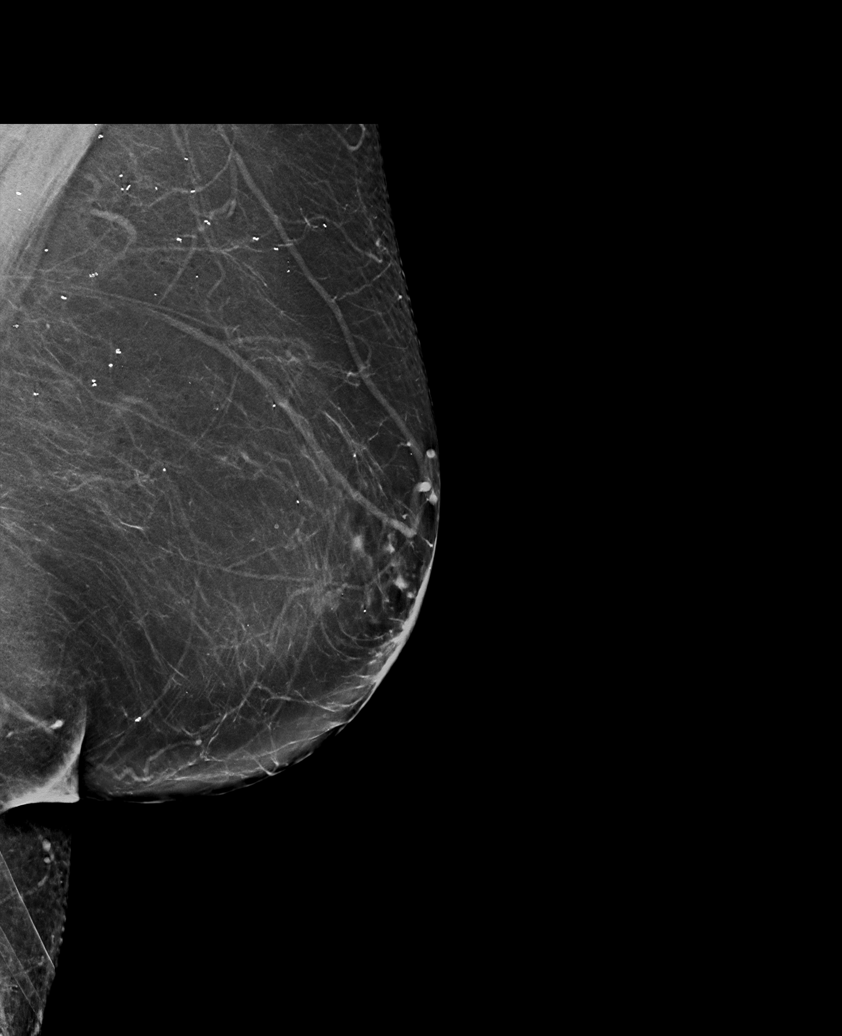

[R CC synth-2D]
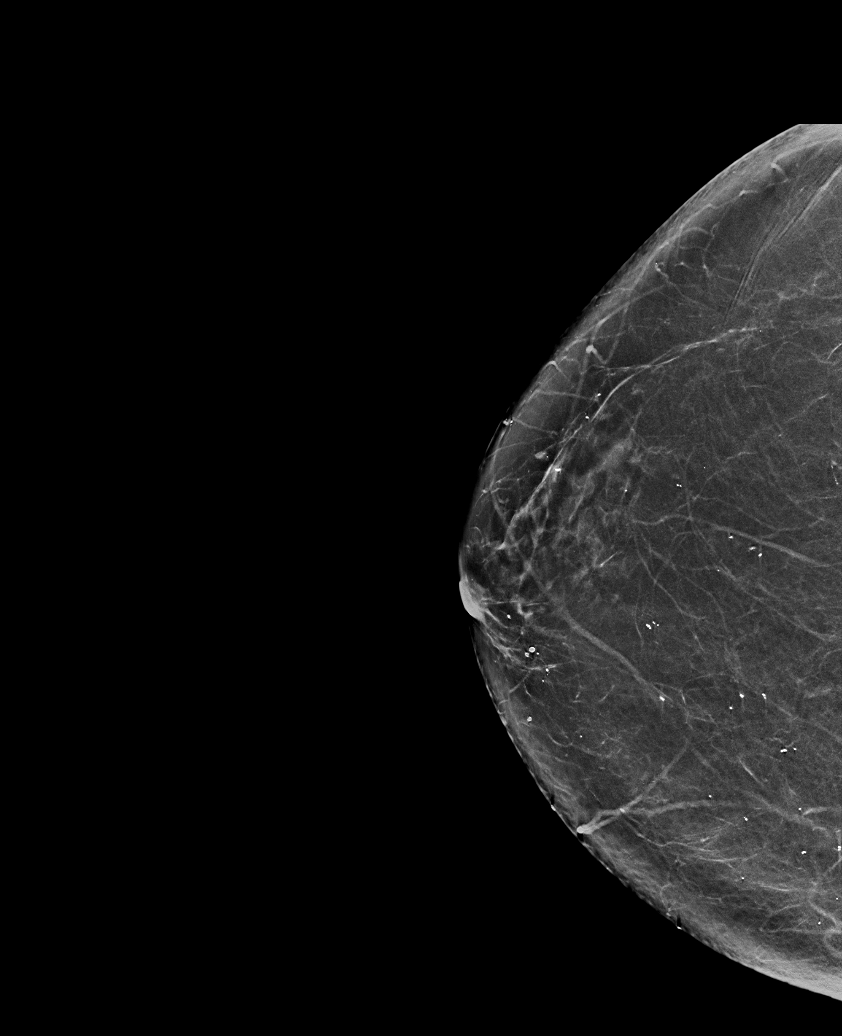

[R MLO synth-2D]
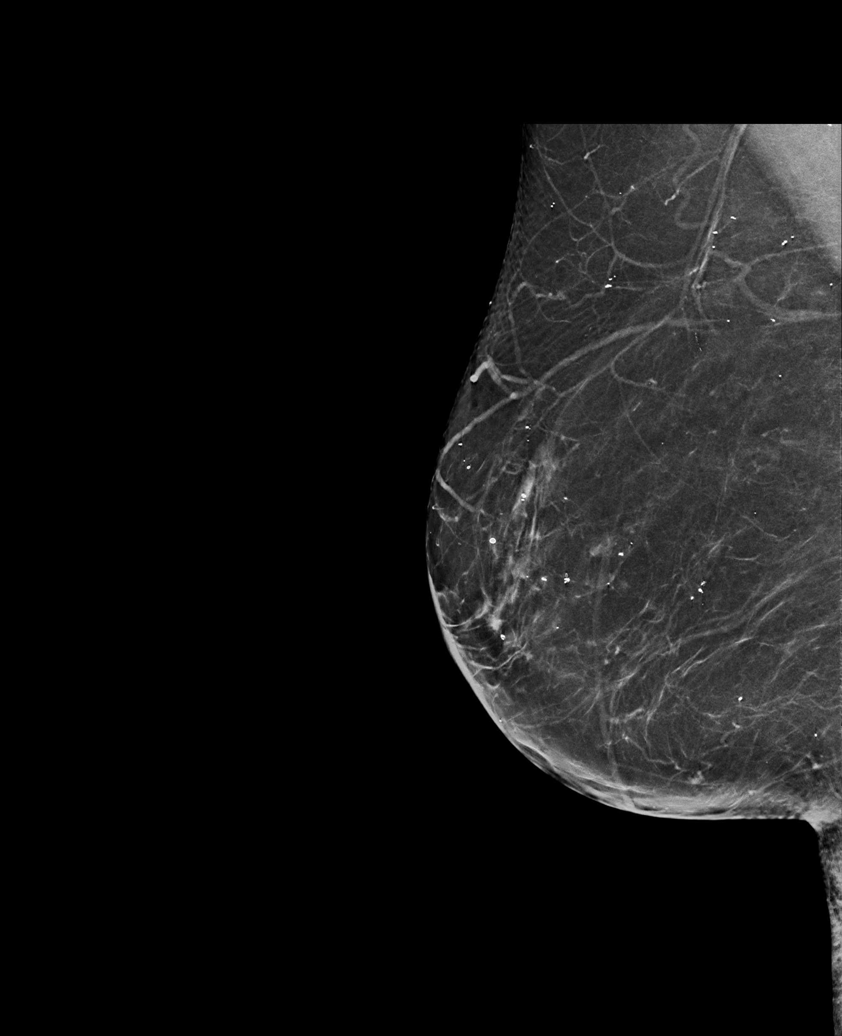

[L MLO tomo · tomo slice 40/79.0]
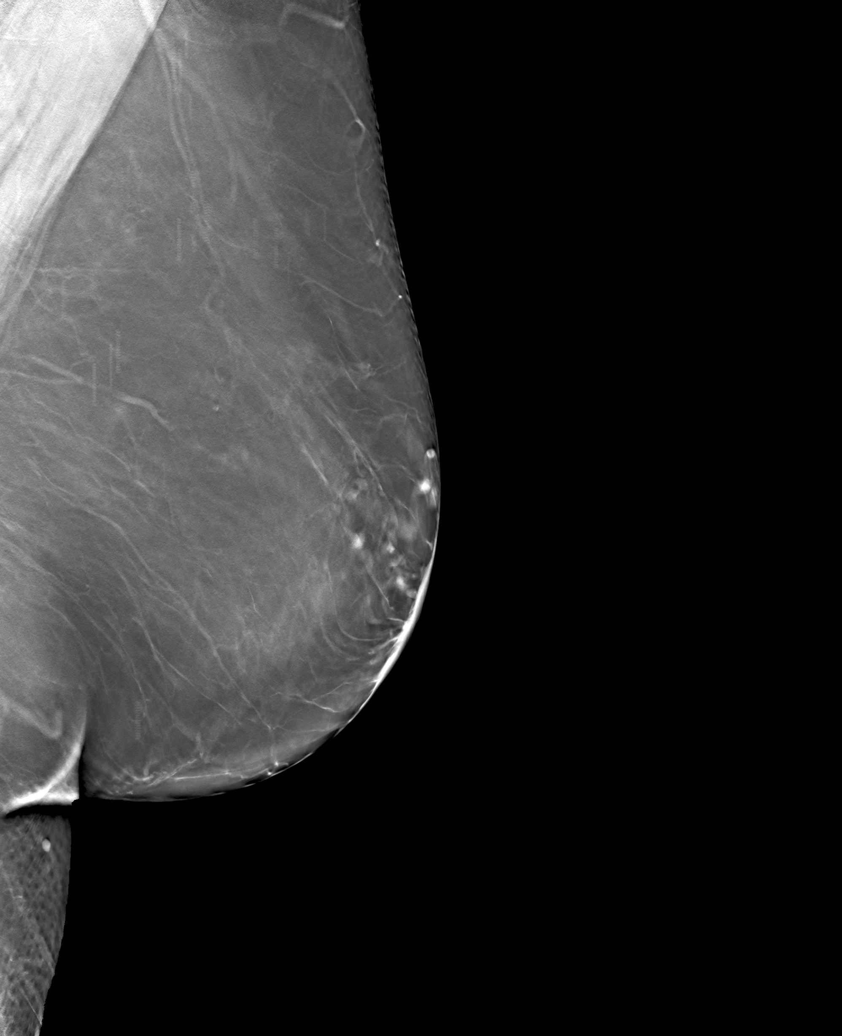

[6 of 30 positions shown; findings below may reference images not displayed]

FINDINGS: There are no findings suspicious for malignancy. Images were
processed with CAD.
IMPRESSION: No mammographic evidence of malignancy. A result letter of this
screening mammogram will be mailed directly to the patient.

RECOMMENDATION:
Screening mammogram in one year. (Code:8Y-Q-VVS)

BI-RADS CATEGORY  1: Negative.

## 2021-12-02 DIAGNOSIS — N390 Urinary tract infection, site not specified: Secondary | ICD-10-CM | POA: Diagnosis not present

## 2021-12-16 ENCOUNTER — Other Ambulatory Visit (HOSPITAL_COMMUNITY): Payer: Self-pay | Admitting: Obstetrics & Gynecology

## 2021-12-16 DIAGNOSIS — Z1231 Encounter for screening mammogram for malignant neoplasm of breast: Secondary | ICD-10-CM

## 2022-01-02 ENCOUNTER — Ambulatory Visit (HOSPITAL_COMMUNITY)
Admission: RE | Admit: 2022-01-02 | Discharge: 2022-01-02 | Disposition: A | Discharge status: Home / Self Care | Payer: Medicare Other | Source: Ambulatory Visit | Attending: Obstetrics & Gynecology | Admitting: Obstetrics & Gynecology

## 2022-01-02 DIAGNOSIS — E119 Type 2 diabetes mellitus without complications: Secondary | ICD-10-CM | POA: Diagnosis not present

## 2022-01-02 DIAGNOSIS — E782 Mixed hyperlipidemia: Secondary | ICD-10-CM | POA: Diagnosis not present

## 2022-01-02 DIAGNOSIS — E559 Vitamin D deficiency, unspecified: Secondary | ICD-10-CM | POA: Diagnosis not present

## 2022-01-02 DIAGNOSIS — Z0001 Encounter for general adult medical examination with abnormal findings: Secondary | ICD-10-CM | POA: Diagnosis not present

## 2022-01-02 DIAGNOSIS — Z1231 Encounter for screening mammogram for malignant neoplasm of breast: Secondary | ICD-10-CM | POA: Insufficient documentation

## 2022-01-02 DIAGNOSIS — Z23 Encounter for immunization: Secondary | ICD-10-CM | POA: Diagnosis not present

## 2022-01-02 DIAGNOSIS — E039 Hypothyroidism, unspecified: Secondary | ICD-10-CM | POA: Diagnosis not present

## 2022-01-02 DIAGNOSIS — E7849 Other hyperlipidemia: Secondary | ICD-10-CM | POA: Diagnosis not present

## 2022-02-13 ENCOUNTER — Encounter: Payer: Self-pay | Admitting: Adult Health

## 2022-02-13 ENCOUNTER — Ambulatory Visit (INDEPENDENT_AMBULATORY_CARE_PROVIDER_SITE_OTHER): Payer: Medicare Other | Admitting: Adult Health

## 2022-02-13 VITALS — BP 124/73 | HR 78 | Ht 65.5 in | Wt 206.5 lb

## 2022-02-13 DIAGNOSIS — R350 Frequency of micturition: Secondary | ICD-10-CM | POA: Diagnosis not present

## 2022-02-13 DIAGNOSIS — N3281 Overactive bladder: Secondary | ICD-10-CM | POA: Diagnosis not present

## 2022-02-13 DIAGNOSIS — Z1211 Encounter for screening for malignant neoplasm of colon: Secondary | ICD-10-CM

## 2022-02-13 DIAGNOSIS — Z01419 Encounter for gynecological examination (general) (routine) without abnormal findings: Secondary | ICD-10-CM | POA: Diagnosis not present

## 2022-02-13 LAB — POCT URINALYSIS DIPSTICK
Blood, UA: NEGATIVE
Glucose, UA: NEGATIVE
Ketones, UA: NEGATIVE
Nitrite, UA: NEGATIVE
Protein, UA: NEGATIVE

## 2022-02-13 MED ORDER — MIRABEGRON ER 25 MG PO TB24: 25.0000 mg | ORAL_TABLET | Freq: Every day | ORAL | 0 refills | Status: DC

## 2022-02-13 NOTE — Progress Notes (Signed)
Patient ID: Stacy Herrera, female   DOB: Dec 19, 1945, 76 y.o.   MRN: [id number] History of Present Illness: Taylin is a 76 year old white female, widowed, sp hysterectomy, in for a well woman gyn exam and complains of urinary frequency esp at night and leaks some.   PCP is Dr Hilma Favors.  Current Medications, Allergies, Past Medical History, Past Surgical History, Family History and Social History were reviewed in [employer] record.     Review of Systems: Patient denies any headaches, hearing loss, fatigue, blurred vision, shortness of breath, chest pain, abdominal pain, problems with bowel movements,  or intercourse.(Not active). No joint pain or mood swings.  See HPI for positives   Physical Exam:BP 124/73 (BP Location: Left Arm, Patient Position: Sitting, Cuff Size: Normal)   Pulse 78   Ht 5' 5.5" (1.664 m)   Wt 206 lb 8 oz (93.7 kg)   BMI 33.84 kg/m  urine dipstick small leuks General:  Well developed, well nourished, no acute distress Skin:  Warm and dry Neck:  Midline trachea, normal thyroid, good ROM, no lymphadenopathy,no carotid bruits heard Lungs; Clear to auscultation bilaterally Breast:  No dominant palpable mass, retraction, or nipple discharge,sp breast reduction Cardiovascular: Regular rate and rhythm Abdomen:  Soft, non tender, no hepatosplenomegaly,sp  tummy tuck this summer Pelvic:  External genitalia is normal in appearance, no lesions.  The vagina is pale. Urethra has no lesions or masses. The cervix and uterus are absent. No adnexal masses or tenderness noted.Bladder is non tender, no masses felt. Rectal: Good sphincter tone, no polyps, or hemorrhoids felt.  Hemoccult negative. Extremities/musculoskeletal:  No swelling or varicosities noted, no clubbing or cyanosis,hs bruise left knee tripped over garden hose Psych:  No mood changes, alert and cooperative,seems happy AA is 2 Fall risk is moderate    02/13/2022   10:01 AM 01/15/2020    10:19 AM 12/18/2016   12:17 PM  Depression screen PHQ 2/9  Decreased Interest 0 0 0  Down, Depressed, Hopeless 0 0 0  PHQ - 2 Score 0 0 0  Altered sleeping 0 0   Tired, decreased energy 0 0   Change in appetite 0 0   Feeling bad or failure about yourself  0 0   Trouble concentrating 0 0   Moving slowly or fidgety/restless 0 0   Suicidal thoughts 0 0   PHQ-9 Score 0 0        02/13/2022   10:02 AM 01/15/2020   10:19 AM  GAD 7 : Generalized Anxiety Score  Nervous, Anxious, on Edge 0 0  Control/stop worrying 0 0  Worry too much - different things 0 0  Trouble relaxing 0 0  Restless 0 0  Easily annoyed or irritable 0 0  Afraid - awful might happen 0 0  Total GAD 7 Score 0 0      [facility] - 02/13/22 0957       Pregnancy Intention Screening   Does the patient want to become pregnant in the next year? N/A    Does the patient's partner want to become pregnant in the next year? N/A    Would the patient like to discuss contraceptive options today? N/A      Contraception Wrap Up   Current Method Female Sterilization   hyst   End Method Female Sterilization   hyst   Contraception Counseling Provided No            Examination chaperoned by Levy Pupa LPN   Impression  and Plan: 1. Urinary frequency Up at night several times  Will try myrbetriq   2. OAB (overactive bladder) Will try myrbetriq 25 mg 1 daily, #28 tablet samples given to try  Let me know if help will send in RX Meds ordered this encounter  Medications   mirabegron ER (MYRBETRIQ) 25 MG TB24 tablet    Sig: Take 1 tablet (25 mg total) by mouth daily.    Dispense:  28 tablet    Refill:  0    Order Specific Question:   Supervising Provider    Answer:   Elonda Husky, LUTHER H [[id number]]    3. Encounter for well woman exam with routine gynecological exam Physical in 2 years Labs with PCP Had negative mammogram 01/02/22 Colonoscopy per GI   4. Encounter for screening fecal occult blood testing Hemoccult was  negative

## 2022-03-09 ENCOUNTER — Telehealth: Payer: Self-pay | Admitting: Adult Health

## 2022-03-09 MED ORDER — FLUCONAZOLE 150 MG PO TABS: ORAL_TABLET | ORAL | 1 refills | Status: DC

## 2022-03-09 NOTE — Telephone Encounter (Signed)
Pt is requesting a medication for vaginal itching. Please advise

## 2022-03-09 NOTE — Telephone Encounter (Signed)
Will rx diflucan  

## 2022-03-09 NOTE — Telephone Encounter (Signed)
Pt has vaginal itching X 3 days. No other symptoms. Can you send something in? Thanks! [place]

## 2022-04-05 DIAGNOSIS — C44319 Basal cell carcinoma of skin of other parts of face: Secondary | ICD-10-CM | POA: Diagnosis not present

## 2022-04-06 DIAGNOSIS — D225 Melanocytic nevi of trunk: Secondary | ICD-10-CM | POA: Diagnosis not present

## 2022-04-06 DIAGNOSIS — D1801 Hemangioma of skin and subcutaneous tissue: Secondary | ICD-10-CM | POA: Diagnosis not present

## 2022-04-06 DIAGNOSIS — C44319 Basal cell carcinoma of skin of other parts of face: Secondary | ICD-10-CM | POA: Diagnosis not present

## 2022-04-06 DIAGNOSIS — Z85820 Personal history of malignant melanoma of skin: Secondary | ICD-10-CM | POA: Diagnosis not present

## 2022-04-06 DIAGNOSIS — L821 Other seborrheic keratosis: Secondary | ICD-10-CM | POA: Diagnosis not present

## 2022-04-06 DIAGNOSIS — L570 Actinic keratosis: Secondary | ICD-10-CM | POA: Diagnosis not present

## 2022-04-06 DIAGNOSIS — L820 Inflamed seborrheic keratosis: Secondary | ICD-10-CM | POA: Diagnosis not present

## 2022-04-06 DIAGNOSIS — Z08 Encounter for follow-up examination after completed treatment for malignant neoplasm: Secondary | ICD-10-CM | POA: Diagnosis not present

## 2022-04-06 DIAGNOSIS — X32XXXD Exposure to sunlight, subsequent encounter: Secondary | ICD-10-CM | POA: Diagnosis not present

## 2022-04-20 DIAGNOSIS — L570 Actinic keratosis: Secondary | ICD-10-CM | POA: Diagnosis not present

## 2022-04-20 DIAGNOSIS — X32XXXD Exposure to sunlight, subsequent encounter: Secondary | ICD-10-CM | POA: Diagnosis not present

## 2022-04-20 DIAGNOSIS — C44319 Basal cell carcinoma of skin of other parts of face: Secondary | ICD-10-CM | POA: Diagnosis not present

## 2022-05-25 DIAGNOSIS — Z08 Encounter for follow-up examination after completed treatment for malignant neoplasm: Secondary | ICD-10-CM | POA: Diagnosis not present

## 2022-05-25 DIAGNOSIS — Z85828 Personal history of other malignant neoplasm of skin: Secondary | ICD-10-CM | POA: Diagnosis not present

## 2022-11-22 IMAGING — MG MM DIGITAL SCREENING BILAT W/ TOMO AND CAD
8 series · 8 of 24 positions shown · non-contrast
Comparison: Previous exam(s).

CLINICAL DATA: Screening.

EXAM:
DIGITAL SCREENING BILATERAL MAMMOGRAM WITH TOMOSYNTHESIS AND CAD
TECHNIQUE: Bilateral screening digital craniocaudal and mediolateral oblique
mammograms were obtained. Bilateral screening digital breast
tomosynthesis was performed. The images were evaluated with
computer-aided detection.

[R CC synth-2D]
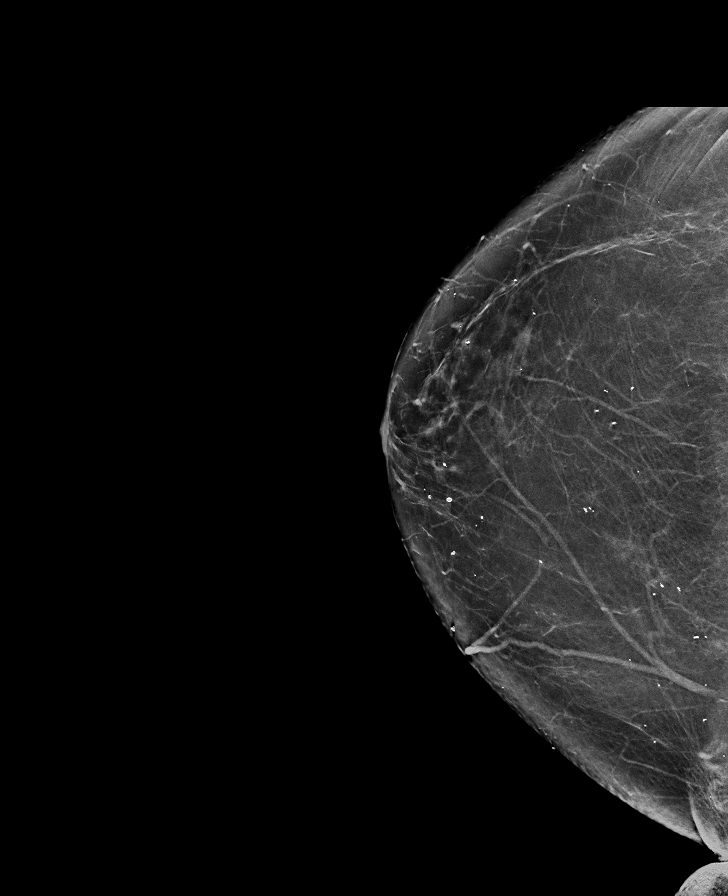

[L MLO synth-2D]
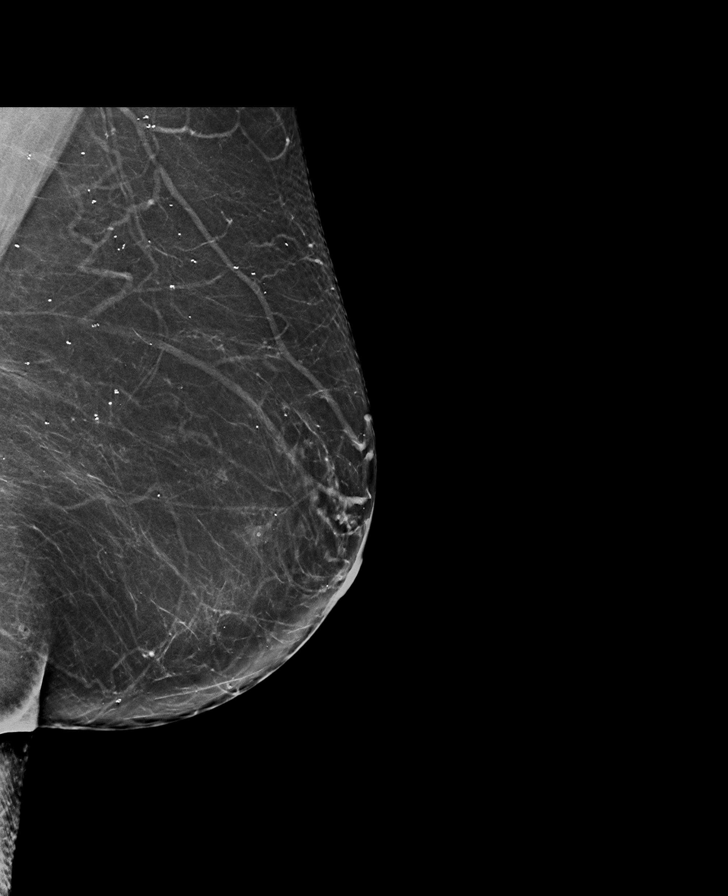

[R MLO synth-2D]
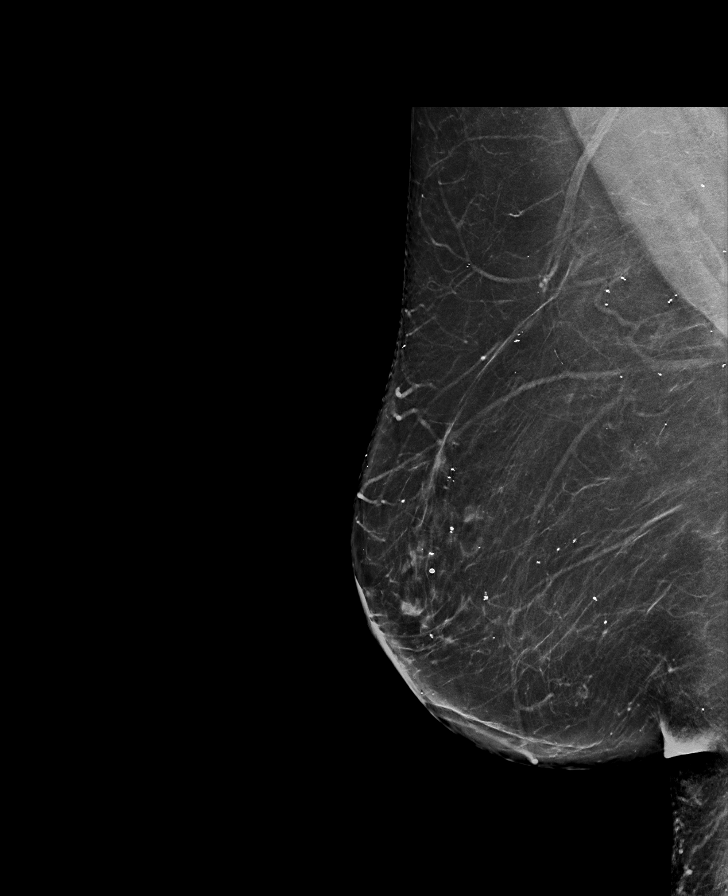

[L CC synth-2D]
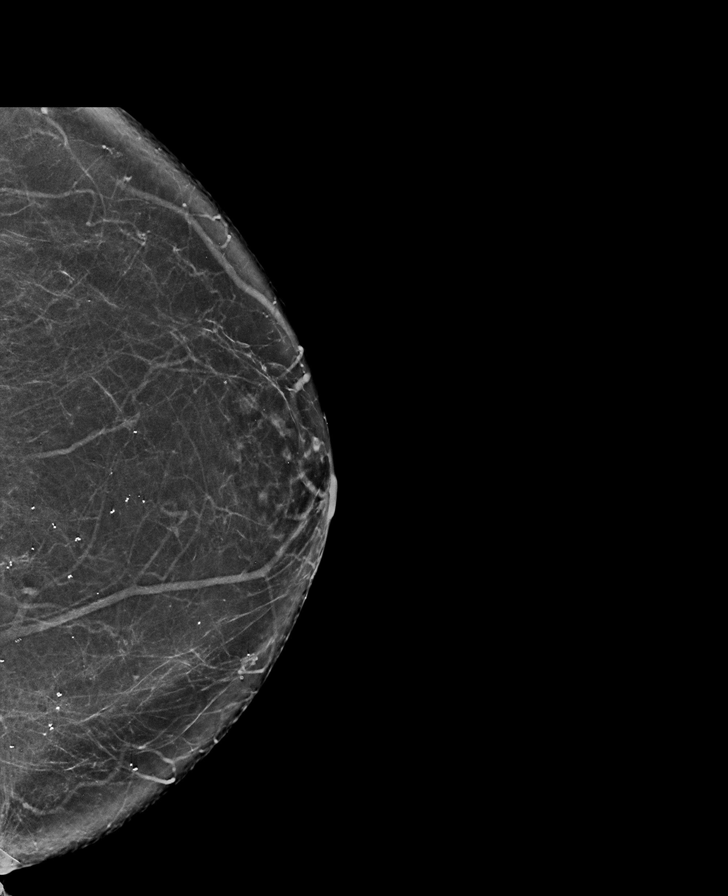

[L CC tomo · tomo slice 36/71.0]
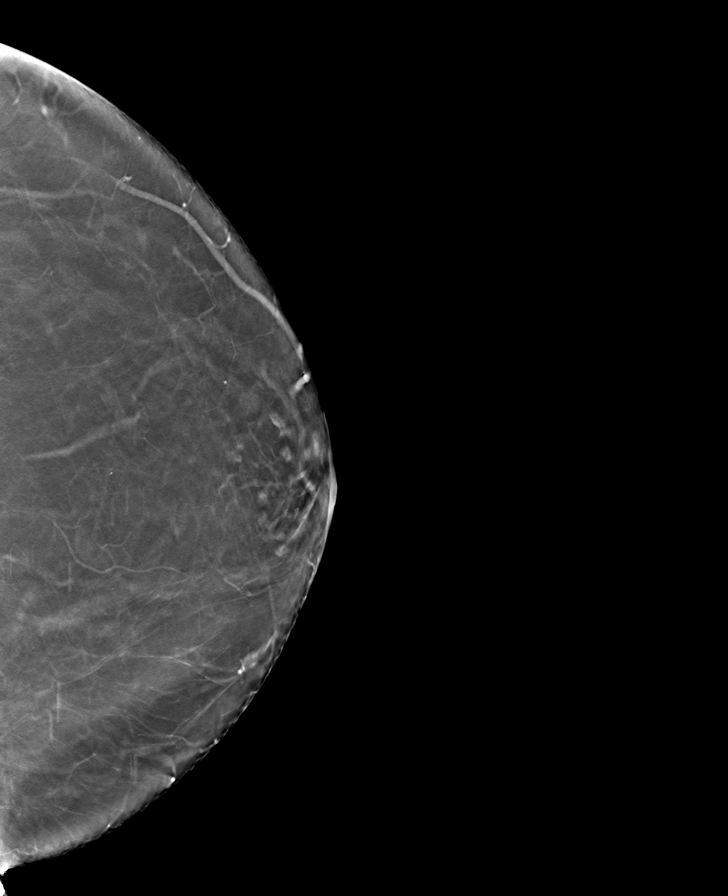

[R CC tomo · tomo slice 37/73.0]
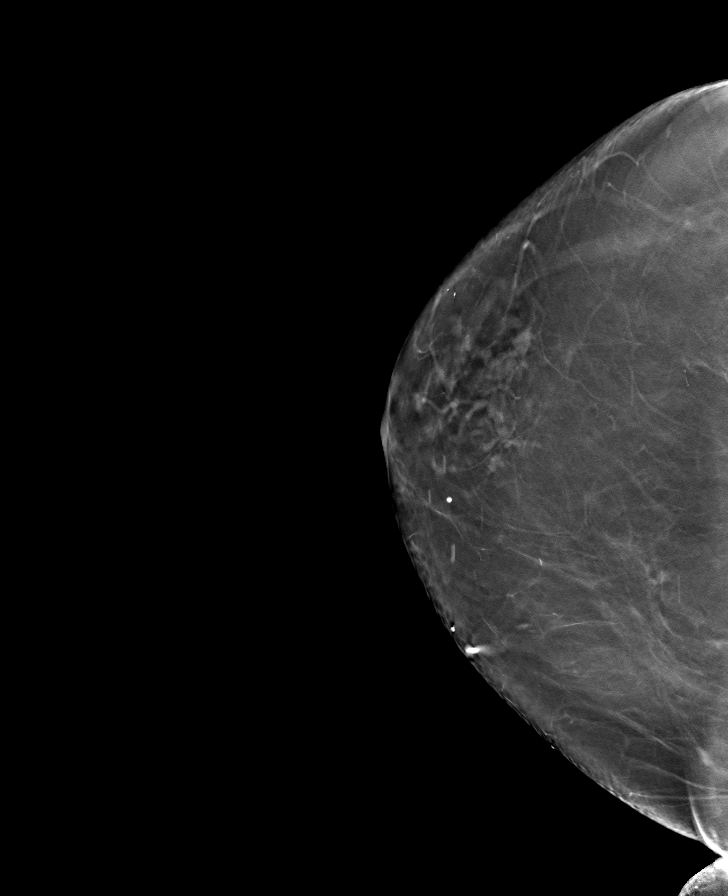

[L MLO tomo · tomo slice 39/78.0]
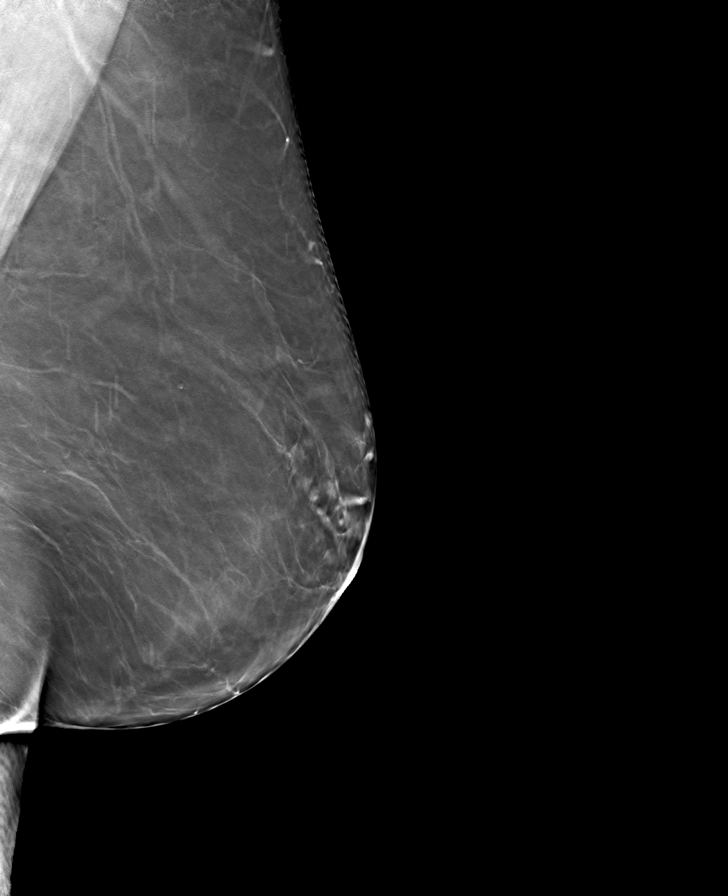

[R MLO tomo · tomo slice 41/82.0]
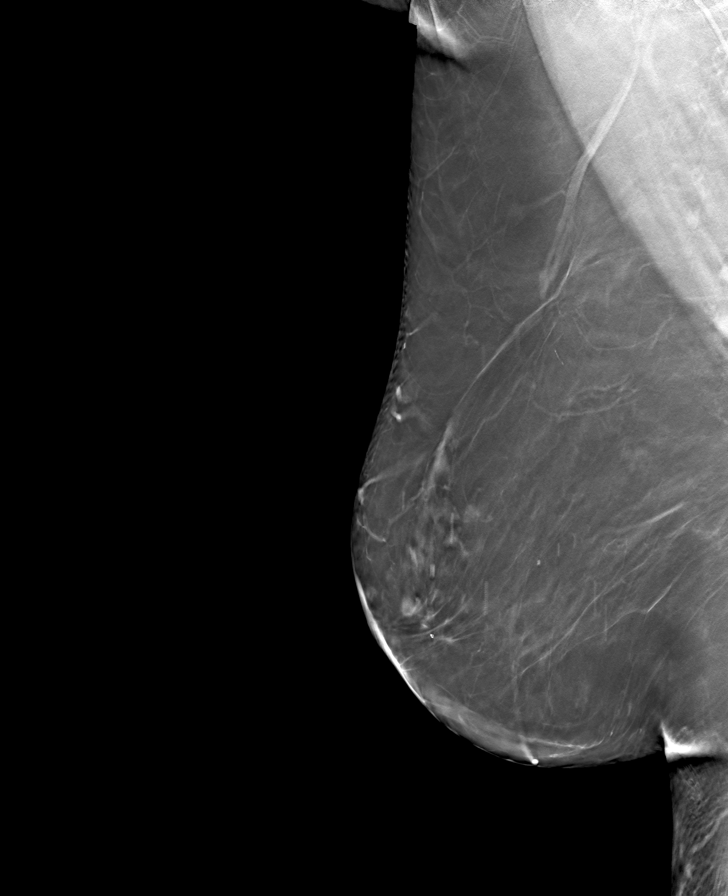

[8 of 24 positions shown; findings below may reference images not displayed]

ACR Breast Density Category b: There are scattered areas of
fibroglandular density.
FINDINGS: There are no findings suspicious for malignancy.
IMPRESSION: No mammographic evidence of malignancy. A result letter of this
screening mammogram will be mailed directly to the patient.

RECOMMENDATION:
Screening mammogram in one year. (Code:51-O-LD2)

BI-RADS CATEGORY  1: Negative.

## 2022-11-23 DIAGNOSIS — Z85828 Personal history of other malignant neoplasm of skin: Secondary | ICD-10-CM | POA: Diagnosis not present

## 2022-11-23 DIAGNOSIS — L570 Actinic keratosis: Secondary | ICD-10-CM | POA: Diagnosis not present

## 2022-11-23 DIAGNOSIS — Z08 Encounter for follow-up examination after completed treatment for malignant neoplasm: Secondary | ICD-10-CM | POA: Diagnosis not present

## 2022-11-23 DIAGNOSIS — X32XXXD Exposure to sunlight, subsequent encounter: Secondary | ICD-10-CM | POA: Diagnosis not present

## 2022-11-27 ENCOUNTER — Other Ambulatory Visit (HOSPITAL_COMMUNITY): Payer: Self-pay | Admitting: Family Medicine

## 2022-11-27 DIAGNOSIS — Z1231 Encounter for screening mammogram for malignant neoplasm of breast: Secondary | ICD-10-CM

## 2023-01-05 ENCOUNTER — Encounter (HOSPITAL_COMMUNITY): Payer: Self-pay

## 2023-01-05 ENCOUNTER — Ambulatory Visit (HOSPITAL_COMMUNITY)
Admission: RE | Admit: 2023-01-05 | Discharge: 2023-01-05 | Disposition: A | Discharge status: Home / Self Care | Payer: Medicare Other | Source: Ambulatory Visit | Attending: Family Medicine | Admitting: Family Medicine

## 2023-01-05 DIAGNOSIS — Z1231 Encounter for screening mammogram for malignant neoplasm of breast: Secondary | ICD-10-CM | POA: Insufficient documentation

## 2023-01-08 DIAGNOSIS — E7849 Other hyperlipidemia: Secondary | ICD-10-CM | POA: Diagnosis not present

## 2023-01-08 DIAGNOSIS — E119 Type 2 diabetes mellitus without complications: Secondary | ICD-10-CM | POA: Diagnosis not present

## 2023-01-08 DIAGNOSIS — R7309 Other abnormal glucose: Secondary | ICD-10-CM | POA: Diagnosis not present

## 2023-01-08 DIAGNOSIS — E559 Vitamin D deficiency, unspecified: Secondary | ICD-10-CM | POA: Diagnosis not present

## 2023-01-08 DIAGNOSIS — E782 Mixed hyperlipidemia: Secondary | ICD-10-CM | POA: Diagnosis not present

## 2023-01-08 DIAGNOSIS — E039 Hypothyroidism, unspecified: Secondary | ICD-10-CM | POA: Diagnosis not present

## 2023-01-08 DIAGNOSIS — Z0001 Encounter for general adult medical examination with abnormal findings: Secondary | ICD-10-CM | POA: Diagnosis not present

## 2023-07-06 ENCOUNTER — Encounter: Payer: Self-pay | Admitting: Adult Health

## 2023-07-06 ENCOUNTER — Ambulatory Visit: Admitting: Adult Health

## 2023-07-06 VITALS — BP 118/75 | HR 80 | Ht 66.0 in | Wt 212.5 lb

## 2023-07-06 DIAGNOSIS — N644 Mastodynia: Secondary | ICD-10-CM | POA: Diagnosis not present

## 2023-07-06 DIAGNOSIS — N63 Unspecified lump in unspecified breast: Secondary | ICD-10-CM | POA: Diagnosis not present

## 2023-07-06 NOTE — Progress Notes (Signed)
  Subjective:     Patient ID: Stacy Herrera, female   DOB: 12/25/1945, 78 y.o.   MRN: [id number]  HPI Stacy Herrera is a 78 year old white female, widowed, sp hysterectomy in complaining of pain and itching in right breast on and off for about a month. She had a normal mammogram 01/05/23.  PCP is Dr Stacy Herrera  Review of Systems  +pain and itching in right breast on and off for about a month. She had a normal mammogram 01/05/23.   Reviewed past medical,surgical, social and family history. Reviewed medications and allergies.  Objective:   Physical Exam BP 118/75 (BP Location: Left Arm, Patient Position: Sitting, Cuff Size: Normal)   Pulse 80   Ht 5\' 6"  (1.676 m)   Wt 212 lb 8 oz (96.4 kg)   BMI 34.30 kg/m      Skin warm and dry,  Breasts:no dominate palpable mass, retraction or nipple discharge on the left. On the right breast, no retraction or nipple discharge, has pea sized nodule at 6-7 o'clock near areola, that is tender, she is sp breast reduction, like 20 years ago. Fall risk is moderate  [facility] - 07/06/23 1035       Pregnancy Intention Screening   Does the patient want to become pregnant in the next year? N/A    Does the patient's partner want to become pregnant in the next year? N/A    Would the patient like to discuss contraceptive options today? N/A      Contraception Wrap Up   Current Method Female Sterilization   hyst   End Method Female Sterilization   hyst   Contraception Counseling Provided No             Assessment:     1. Breast pain, right (Primary) Has had pain in right breast on and off for a month, has had some itching too Right diagnostic mammogram and US  scheduled for Crist[facility] Done for 07/24/23 at 9:40 am  - US  LIMITED ULTRASOUND INCLUDING AXILLA RIGHT BREAST; Future - MM 3D DIAGNOSTIC MAMMOGRAM UNILATERAL RIGHT BREAST; Future  2. Breast nodule Has nodule in right breast at 6-7 0'clock was tender on palpation Has right diagnostic mammogram and US  at [facility]  07/24/23  - US  LIMITED ULTRASOUND INCLUDING AXILLA RIGHT BREAST; Future - MM 3D DIAGNOSTIC MAMMOGRAM UNILATERAL RIGHT BREAST; Future     Plan:     Follow up prn

## 2023-07-24 ENCOUNTER — Ambulatory Visit (HOSPITAL_COMMUNITY)
Admission: RE | Admit: 2023-07-24 | Discharge: 2023-07-24 | Disposition: A | Discharge status: Home / Self Care | Source: Ambulatory Visit | Attending: Adult Health | Admitting: Adult Health

## 2023-07-24 ENCOUNTER — Ambulatory Visit: Payer: Self-pay | Admitting: Adult Health

## 2023-07-24 DIAGNOSIS — N6315 Unspecified lump in the right breast, overlapping quadrants: Secondary | ICD-10-CM | POA: Diagnosis not present

## 2023-07-24 DIAGNOSIS — N644 Mastodynia: Secondary | ICD-10-CM

## 2023-07-24 DIAGNOSIS — N6313 Unspecified lump in the right breast, lower outer quadrant: Secondary | ICD-10-CM | POA: Diagnosis not present

## 2023-07-24 DIAGNOSIS — N63 Unspecified lump in unspecified breast: Secondary | ICD-10-CM

## 2023-07-24 DIAGNOSIS — R92321 Mammographic fibroglandular density, right breast: Secondary | ICD-10-CM | POA: Diagnosis not present

## 2023-08-09 DIAGNOSIS — X32XXXD Exposure to sunlight, subsequent encounter: Secondary | ICD-10-CM | POA: Diagnosis not present

## 2023-08-09 DIAGNOSIS — D225 Melanocytic nevi of trunk: Secondary | ICD-10-CM | POA: Diagnosis not present

## 2023-08-09 DIAGNOSIS — L820 Inflamed seborrheic keratosis: Secondary | ICD-10-CM | POA: Diagnosis not present

## 2023-08-09 DIAGNOSIS — Z1283 Encounter for screening for malignant neoplasm of skin: Secondary | ICD-10-CM | POA: Diagnosis not present

## 2023-08-09 DIAGNOSIS — L570 Actinic keratosis: Secondary | ICD-10-CM | POA: Diagnosis not present

## 2023-08-09 DIAGNOSIS — Z85820 Personal history of malignant melanoma of skin: Secondary | ICD-10-CM | POA: Diagnosis not present

## 2023-08-09 DIAGNOSIS — Z08 Encounter for follow-up examination after completed treatment for malignant neoplasm: Secondary | ICD-10-CM | POA: Diagnosis not present

## 2023-08-09 DIAGNOSIS — C44612 Basal cell carcinoma of skin of right upper limb, including shoulder: Secondary | ICD-10-CM | POA: Diagnosis not present

## 2023-10-22 DIAGNOSIS — Z08 Encounter for follow-up examination after completed treatment for malignant neoplasm: Secondary | ICD-10-CM | POA: Diagnosis not present

## 2023-10-22 DIAGNOSIS — Z85828 Personal history of other malignant neoplasm of skin: Secondary | ICD-10-CM | POA: Diagnosis not present

## 2023-12-21 ENCOUNTER — Other Ambulatory Visit (HOSPITAL_COMMUNITY): Payer: Self-pay | Admitting: Adult Health

## 2023-12-21 DIAGNOSIS — N644 Mastodynia: Secondary | ICD-10-CM

## 2024-01-29 ENCOUNTER — Other Ambulatory Visit (HOSPITAL_COMMUNITY): Payer: Self-pay | Admitting: Adult Health

## 2024-01-29 ENCOUNTER — Ambulatory Visit (HOSPITAL_COMMUNITY)
Admission: RE | Admit: 2024-01-29 | Discharge: 2024-01-29 | Disposition: A | Source: Ambulatory Visit | Attending: Adult Health | Admitting: Adult Health

## 2024-01-29 ENCOUNTER — Encounter (HOSPITAL_COMMUNITY): Payer: Self-pay

## 2024-01-29 ENCOUNTER — Ambulatory Visit (HOSPITAL_COMMUNITY)
Admission: RE | Admit: 2024-01-29 | Discharge: 2024-01-29 | Disposition: A | Discharge status: Home / Self Care | Source: Ambulatory Visit | Attending: Adult Health | Admitting: Adult Health

## 2024-01-29 DIAGNOSIS — R928 Other abnormal and inconclusive findings on diagnostic imaging of breast: Secondary | ICD-10-CM

## 2024-01-29 DIAGNOSIS — N644 Mastodynia: Secondary | ICD-10-CM

## 2024-01-30 ENCOUNTER — Ambulatory Visit: Payer: Self-pay | Admitting: Adult Health

## 2024-05-09 ENCOUNTER — Ambulatory Visit: Admitting: Adult Health

## 2024-08-05 ENCOUNTER — Other Ambulatory Visit (HOSPITAL_COMMUNITY)
